# Patient Record
Sex: Female | Born: 1945
Health system: Southern US, Community
[De-identification: ages and names within clinical notes are randomized; demographics above are authoritative.]

## PROBLEM LIST (undated history)

## (undated) DIAGNOSIS — E119 Type 2 diabetes mellitus without complications: Secondary | ICD-10-CM

## (undated) DIAGNOSIS — I2510 Atherosclerotic heart disease of native coronary artery without angina pectoris: Secondary | ICD-10-CM

## (undated) DIAGNOSIS — B019 Varicella without complication: Secondary | ICD-10-CM

## (undated) DIAGNOSIS — I519 Heart disease, unspecified: Secondary | ICD-10-CM

## (undated) DIAGNOSIS — Z87891 Personal history of nicotine dependence: Secondary | ICD-10-CM

## (undated) DIAGNOSIS — I255 Ischemic cardiomyopathy: Secondary | ICD-10-CM

## (undated) DIAGNOSIS — T7840XA Allergy, unspecified, initial encounter: Secondary | ICD-10-CM

## (undated) DIAGNOSIS — H269 Unspecified cataract: Secondary | ICD-10-CM

## (undated) DIAGNOSIS — B059 Measles without complication: Secondary | ICD-10-CM

## (undated) DIAGNOSIS — E785 Hyperlipidemia, unspecified: Secondary | ICD-10-CM

## (undated) DIAGNOSIS — I214 Non-ST elevation (NSTEMI) myocardial infarction: Secondary | ICD-10-CM

## (undated) DIAGNOSIS — I10 Essential (primary) hypertension: Secondary | ICD-10-CM

## (undated) HISTORY — DX: Heart disease, unspecified: I51.9

## (undated) HISTORY — DX: Varicella without complication: B01.9

## (undated) HISTORY — PX: TUBAL LIGATION: SHX77

## (undated) HISTORY — DX: Measles without complication: B05.9

## (undated) HISTORY — DX: Essential (primary) hypertension: I10

## (undated) HISTORY — PX: GALLBLADDER SURGERY: SHX652

## (undated) HISTORY — DX: Unspecified cataract: H26.9

## (undated) HISTORY — DX: Allergy, unspecified, initial encounter: T78.40XA

## (undated) HISTORY — PX: COLONOSCOPY: SHX174

## (undated) HISTORY — PX: CHOLECYSTECTOMY: SHX55

---
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
## 1998-05-20 ENCOUNTER — Other Ambulatory Visit: Admission: RE | Admit: 1998-05-20 | Discharge: 1998-05-20 | Payer: Self-pay | Admitting: Obstetrics and Gynecology

## 1999-06-11 ENCOUNTER — Other Ambulatory Visit: Admission: RE | Admit: 1999-06-11 | Discharge: 1999-06-11 | Payer: Self-pay | Admitting: Obstetrics and Gynecology

## 2000-04-16 ENCOUNTER — Encounter: Payer: Self-pay | Admitting: *Deleted

## 2000-04-16 ENCOUNTER — Encounter: Admission: RE | Admit: 2000-04-16 | Discharge: 2000-04-16 | Payer: Self-pay | Admitting: *Deleted

## 2000-06-14 ENCOUNTER — Other Ambulatory Visit: Admission: RE | Admit: 2000-06-14 | Discharge: 2000-06-14 | Payer: Self-pay | Admitting: *Deleted

## 2001-04-18 ENCOUNTER — Encounter: Admission: RE | Admit: 2001-04-18 | Discharge: 2001-04-18 | Payer: Self-pay | Admitting: *Deleted

## 2001-04-18 ENCOUNTER — Encounter: Payer: Self-pay | Admitting: *Deleted

## 2001-06-21 ENCOUNTER — Other Ambulatory Visit: Admission: RE | Admit: 2001-06-21 | Discharge: 2001-06-21 | Payer: Self-pay | Admitting: Obstetrics and Gynecology

## 2015-06-02 ENCOUNTER — Inpatient Hospital Stay (HOSPITAL_COMMUNITY)
Admission: EM | Admit: 2015-06-02 | Discharge: 2015-06-04 | DRG: 247 | Disposition: A | Discharge status: Home / Self Care | Payer: Medicare Other | Attending: Cardiovascular Disease | Admitting: Cardiovascular Disease

## 2015-06-02 ENCOUNTER — Encounter (HOSPITAL_COMMUNITY)
Admission: EM | Disposition: A | Discharge status: Home / Self Care | Payer: Self-pay | Source: Home / Self Care | Attending: Cardiovascular Disease

## 2015-06-02 ENCOUNTER — Encounter (HOSPITAL_COMMUNITY): Payer: Self-pay | Admitting: *Deleted

## 2015-06-02 ENCOUNTER — Emergency Department (HOSPITAL_COMMUNITY): Payer: Medicare Other

## 2015-06-02 DIAGNOSIS — R9431 Abnormal electrocardiogram [ECG] [EKG]: Secondary | ICD-10-CM

## 2015-06-02 DIAGNOSIS — Z87891 Personal history of nicotine dependence: Secondary | ICD-10-CM | POA: Diagnosis not present

## 2015-06-02 DIAGNOSIS — I214 Non-ST elevation (NSTEMI) myocardial infarction: Secondary | ICD-10-CM | POA: Diagnosis not present

## 2015-06-02 DIAGNOSIS — I10 Essential (primary) hypertension: Secondary | ICD-10-CM | POA: Diagnosis present

## 2015-06-02 DIAGNOSIS — E785 Hyperlipidemia, unspecified: Secondary | ICD-10-CM

## 2015-06-02 DIAGNOSIS — I2510 Atherosclerotic heart disease of native coronary artery without angina pectoris: Secondary | ICD-10-CM | POA: Diagnosis not present

## 2015-06-02 DIAGNOSIS — R0602 Shortness of breath: Secondary | ICD-10-CM | POA: Diagnosis not present

## 2015-06-02 DIAGNOSIS — Z955 Presence of coronary angioplasty implant and graft: Secondary | ICD-10-CM

## 2015-06-02 DIAGNOSIS — I255 Ischemic cardiomyopathy: Secondary | ICD-10-CM | POA: Diagnosis not present

## 2015-06-02 DIAGNOSIS — I252 Old myocardial infarction: Secondary | ICD-10-CM | POA: Diagnosis present

## 2015-06-02 DIAGNOSIS — I2582 Chronic total occlusion of coronary artery: Secondary | ICD-10-CM | POA: Diagnosis not present

## 2015-06-02 DIAGNOSIS — R079 Chest pain, unspecified: Secondary | ICD-10-CM | POA: Diagnosis not present

## 2015-06-02 DIAGNOSIS — I4581 Long QT syndrome: Secondary | ICD-10-CM | POA: Diagnosis present

## 2015-06-02 HISTORY — DX: Non-ST elevation (NSTEMI) myocardial infarction: I21.4

## 2015-06-02 HISTORY — DX: Ischemic cardiomyopathy: I25.5

## 2015-06-02 HISTORY — PX: CARDIAC CATHETERIZATION: SHX172

## 2015-06-02 HISTORY — DX: Atherosclerotic heart disease of native coronary artery without angina pectoris: I25.10

## 2015-06-02 HISTORY — DX: Hyperlipidemia, unspecified: E78.5

## 2015-06-02 HISTORY — DX: Personal history of nicotine dependence: Z87.891

## 2015-06-02 LAB — CBC
HEMATOCRIT: 44.7 % (ref 36.0–46.0)
Hemoglobin: 15.4 g/dL — ABNORMAL HIGH (ref 12.0–15.0)
MCH: 31.2 pg (ref 26.0–34.0)
MCHC: 34.5 g/dL (ref 30.0–36.0)
MCV: 90.7 fL (ref 78.0–100.0)
PLATELETS: 206 10*3/uL (ref 150–400)
RBC: 4.93 MIL/uL (ref 3.87–5.11)
RDW: 12.7 % (ref 11.5–15.5)
WBC: 9.9 10*3/uL (ref 4.0–10.5)

## 2015-06-02 LAB — MRSA PCR SCREENING: MRSA BY PCR: NEGATIVE

## 2015-06-02 LAB — CBC WITH DIFFERENTIAL/PLATELET
BASOS ABS: 0 10*3/uL (ref 0.0–0.1)
Basophils Relative: 0 %
EOS ABS: 0.1 10*3/uL (ref 0.0–0.7)
EOS PCT: 1 %
HCT: 43.1 % (ref 36.0–46.0)
HEMOGLOBIN: 15.2 g/dL — AB (ref 12.0–15.0)
LYMPHS ABS: 1.6 10*3/uL (ref 0.7–4.0)
Lymphocytes Relative: 17 %
MCH: 31.4 pg (ref 26.0–34.0)
MCHC: 35.3 g/dL (ref 30.0–36.0)
MCV: 89 fL (ref 78.0–100.0)
Monocytes Absolute: 0.9 10*3/uL (ref 0.1–1.0)
Monocytes Relative: 10 %
NEUTROS PCT: 72 %
Neutro Abs: 6.8 10*3/uL (ref 1.7–7.7)
PLATELETS: 214 10*3/uL (ref 150–400)
RBC: 4.84 MIL/uL (ref 3.87–5.11)
RDW: 12.5 % (ref 11.5–15.5)
WBC: 9.5 10*3/uL (ref 4.0–10.5)

## 2015-06-02 LAB — COMPREHENSIVE METABOLIC PANEL
ALT: 55 U/L — ABNORMAL HIGH (ref 14–54)
AST: 117 U/L — ABNORMAL HIGH (ref 15–41)
Albumin: 4.1 g/dL (ref 3.5–5.0)
Alkaline Phosphatase: 88 U/L (ref 38–126)
Anion gap: 14 (ref 5–15)
BUN: 8 mg/dL (ref 6–20)
CO2: 18 mmol/L — ABNORMAL LOW (ref 22–32)
Calcium: 9.1 mg/dL (ref 8.9–10.3)
Chloride: 103 mmol/L (ref 101–111)
Creatinine, Ser: 0.79 mg/dL (ref 0.44–1.00)
GFR calc Af Amer: >60 mL/min (ref >60)
GFR calc non Af Amer: >60 mL/min (ref >60)
Glucose, Bld: 146 mg/dL — ABNORMAL HIGH (ref 65–99)
Potassium: 4.2 mmol/L (ref 3.5–5.1)
Sodium: 135 mmol/L (ref 135–145)
Total Bilirubin: 2 mg/dL — ABNORMAL HIGH (ref 0.3–1.2)
Total Protein: 6.8 g/dL (ref 6.5–8.1)

## 2015-06-02 LAB — CREATININE, SERUM: CREATININE: 0.82 mg/dL (ref 0.44–1.00)

## 2015-06-02 LAB — BRAIN NATRIURETIC PEPTIDE: B NATRIURETIC PEPTIDE 5: 371.7 pg/mL — AB (ref 0.0–100.0)

## 2015-06-02 LAB — I-STAT TROPONIN, ED: TROPONIN I, POC: 8.79 ng/mL — AB (ref 0.00–0.08)

## 2015-06-02 LAB — TROPONIN I
Troponin I: 35.89 ng/mL (ref <0.031)
Troponin I: 43.74 ng/mL (ref <0.031)

## 2015-06-02 SURGERY — LEFT HEART CATH AND CORONARY ANGIOGRAPHY
Anesthesia: Choice

## 2015-06-02 MED ORDER — TICAGRELOR 90 MG PO TABS
ORAL_TABLET | ORAL | Status: DC | PRN
  Administered 2015-06-02: 180 mg via ORAL

## 2015-06-02 MED ORDER — ONDANSETRON HCL 4 MG/2ML IJ SOLN
INTRAMUSCULAR | Status: AC
  Filled 2015-06-02: qty 2

## 2015-06-02 MED ORDER — HEPARIN BOLUS VIA INFUSION
4000.0000 [IU] | Freq: Once | INTRAVENOUS | Status: AC
  Administered 2015-06-02: 4000 [IU] via INTRAVENOUS
  Filled 2015-06-02: qty 4000

## 2015-06-02 MED ORDER — FENTANYL CITRATE (PF) 100 MCG/2ML IJ SOLN
INTRAMUSCULAR | Status: AC
  Filled 2015-06-02: qty 4

## 2015-06-02 MED ORDER — PROMETHAZINE HCL 25 MG/ML IJ SOLN: 12.5000 mg | Freq: Four times a day (QID) | INTRAMUSCULAR | Status: DC | PRN

## 2015-06-02 MED ORDER — TIROFIBAN HCL IV 5 MG/100ML
INTRAVENOUS | Status: DC | PRN
  Administered 2015-06-02: 0.15 ug/kg/min via INTRAVENOUS

## 2015-06-02 MED ORDER — TICAGRELOR 90 MG PO TABS
90.0000 mg | ORAL_TABLET | Freq: Two times a day (BID) | ORAL | Status: DC
  Administered 2015-06-02 – 2015-06-04 (×4): 90 mg via ORAL
  Filled 2015-06-02 (×4): qty 1

## 2015-06-02 MED ORDER — BIVALIRUDIN BOLUS VIA INFUSION - CUPID
INTRAVENOUS | Status: DC | PRN
  Administered 2015-06-02: 62.925 mg via INTRAVENOUS

## 2015-06-02 MED ORDER — VERAPAMIL HCL 2.5 MG/ML IV SOLN
INTRAVENOUS | Status: DC | PRN
  Administered 2015-06-02: 10 mL via INTRA_ARTERIAL

## 2015-06-02 MED ORDER — TICAGRELOR 90 MG PO TABS
ORAL_TABLET | ORAL | Status: AC
  Filled 2015-06-02: qty 2

## 2015-06-02 MED ORDER — SODIUM CHLORIDE 0.9 % IV SOLN
250.0000 mg | INTRAVENOUS | Status: DC | PRN
  Administered 2015-06-02: 1.75 mg/kg/h via INTRAVENOUS

## 2015-06-02 MED ORDER — LIDOCAINE HCL (PF) 1 % IJ SOLN
INTRAMUSCULAR | Status: DC | PRN
  Administered 2015-06-02: 5 mL

## 2015-06-02 MED ORDER — SODIUM CHLORIDE 0.9 % IJ SOLN
3.0000 mL | Freq: Two times a day (BID) | INTRAMUSCULAR | Status: DC
Start: 2015-06-02 — End: 2015-06-04
  Administered 2015-06-02 – 2015-06-04 (×4): 3 mL via INTRAVENOUS

## 2015-06-02 MED ORDER — TIROFIBAN HCL IV 5 MG/100ML: 0.1500 ug/kg/min | INTRAVENOUS | Status: DC

## 2015-06-02 MED ORDER — TIROFIBAN (AGGRASTAT) BOLUS VIA INFUSION
INTRAVENOUS | Status: DC | PRN
  Administered 2015-06-02: 2097.5 ug via INTRAVENOUS

## 2015-06-02 MED ORDER — HEPARIN SODIUM (PORCINE) 5000 UNIT/ML IJ SOLN
5000.0000 [IU] | Freq: Three times a day (TID) | INTRAMUSCULAR | Status: DC
  Administered 2015-06-03 (×2): 5000 [IU] via SUBCUTANEOUS
  Filled 2015-06-02 (×2): qty 1

## 2015-06-02 MED ORDER — ASPIRIN EC 81 MG PO TBEC
81.0000 mg | DELAYED_RELEASE_TABLET | Freq: Every day | ORAL | Status: DC
  Administered 2015-06-03 – 2015-06-04 (×2): 81 mg via ORAL
  Filled 2015-06-02 (×2): qty 1

## 2015-06-02 MED ORDER — OXYCODONE-ACETAMINOPHEN 5-325 MG PO TABS: 1.0000 | ORAL_TABLET | ORAL | Status: DC | PRN

## 2015-06-02 MED ORDER — MIDAZOLAM HCL 2 MG/2ML IJ SOLN
INTRAMUSCULAR | Status: AC
  Filled 2015-06-02: qty 4

## 2015-06-02 MED ORDER — FENTANYL CITRATE (PF) 100 MCG/2ML IJ SOLN
INTRAMUSCULAR | Status: DC | PRN
  Administered 2015-06-02 (×3): 25 ug via INTRAVENOUS

## 2015-06-02 MED ORDER — NITROGLYCERIN 1 MG/10 ML FOR IR/CATH LAB
INTRA_ARTERIAL | Status: DC | PRN
  Administered 2015-06-02: 11:00:00

## 2015-06-02 MED ORDER — SODIUM CHLORIDE 0.9 % IJ SOLN: 3.0000 mL | INTRAMUSCULAR | Status: DC | PRN

## 2015-06-02 MED ORDER — SODIUM CHLORIDE 0.9 % IV BOLUS (SEPSIS)
1000.0000 mL | Freq: Once | INTRAVENOUS | Status: AC
  Administered 2015-06-02: 1000 mL via INTRAVENOUS

## 2015-06-02 MED ORDER — NITROGLYCERIN 1 MG/10 ML FOR IR/CATH LAB
INTRA_ARTERIAL | Status: AC
  Filled 2015-06-02: qty 10

## 2015-06-02 MED ORDER — NITROGLYCERIN 0.4 MG SL SUBL
0.4000 mg | SUBLINGUAL_TABLET | SUBLINGUAL | Status: DC | PRN
  Administered 2015-06-02: 0.4 mg via SUBLINGUAL
  Filled 2015-06-02: qty 1

## 2015-06-02 MED ORDER — HEPARIN (PORCINE) IN NACL 2-0.9 UNIT/ML-% IJ SOLN
INTRAMUSCULAR | Status: AC
  Filled 2015-06-02: qty 1500

## 2015-06-02 MED ORDER — SODIUM CHLORIDE 0.9 % IV SOLN
INTRAVENOUS | Status: DC | PRN
  Administered 2015-06-02: 82 mL/h via INTRAVENOUS

## 2015-06-02 MED ORDER — CARVEDILOL 3.125 MG PO TABS
3.1250 mg | ORAL_TABLET | Freq: Two times a day (BID) | ORAL | Status: DC
  Administered 2015-06-02 – 2015-06-04 (×4): 3.125 mg via ORAL
  Filled 2015-06-02 (×4): qty 1

## 2015-06-02 MED ORDER — LIDOCAINE HCL (PF) 1 % IJ SOLN
INTRAMUSCULAR | Status: AC
  Filled 2015-06-02: qty 30

## 2015-06-02 MED ORDER — MIDAZOLAM HCL 2 MG/2ML IJ SOLN
INTRAMUSCULAR | Status: DC | PRN
  Administered 2015-06-02 (×3): 1 mg via INTRAVENOUS

## 2015-06-02 MED ORDER — NITROGLYCERIN IN D5W 200-5 MCG/ML-% IV SOLN
5.0000 ug/min | Freq: Once | INTRAVENOUS | Status: DC
  Filled 2015-06-02: qty 250

## 2015-06-02 MED ORDER — ONDANSETRON HCL 4 MG/2ML IJ SOLN
INTRAMUSCULAR | Status: DC | PRN
  Administered 2015-06-02 (×2): 4 mg via INTRAVENOUS

## 2015-06-02 MED ORDER — NITROGLYCERIN 0.4 MG SL SUBL: 0.4000 mg | SUBLINGUAL_TABLET | SUBLINGUAL | Status: DC | PRN

## 2015-06-02 MED ORDER — TIROFIBAN HCL IV 5 MG/100ML
0.1500 ug/kg/min | INTRAVENOUS | Status: AC
  Administered 2015-06-02: 0.15 ug/kg/min via INTRAVENOUS
  Filled 2015-06-02: qty 100

## 2015-06-02 MED ORDER — VERAPAMIL HCL 2.5 MG/ML IV SOLN
INTRAVENOUS | Status: AC
  Filled 2015-06-02: qty 2

## 2015-06-02 MED ORDER — ASPIRIN 325 MG PO TABS
325.0000 mg | ORAL_TABLET | Freq: Once | ORAL | Status: AC
  Administered 2015-06-02: 325 mg via ORAL
  Filled 2015-06-02: qty 1

## 2015-06-02 MED ORDER — TICAGRELOR 90 MG PO TABS: 180.0000 mg | ORAL_TABLET | Freq: Once | ORAL | Status: DC

## 2015-06-02 MED ORDER — ATORVASTATIN CALCIUM 80 MG PO TABS
80.0000 mg | ORAL_TABLET | Freq: Every day | ORAL | Status: DC
  Administered 2015-06-02 – 2015-06-03 (×2): 80 mg via ORAL
  Filled 2015-06-02 (×2): qty 1

## 2015-06-02 MED ORDER — HEPARIN (PORCINE) IN NACL 100-0.45 UNIT/ML-% IJ SOLN
1100.0000 [IU]/h | INTRAMUSCULAR | Status: DC
  Administered 2015-06-02: 1100 [IU]/h via INTRAVENOUS
  Filled 2015-06-02: qty 250

## 2015-06-02 MED ORDER — TIROFIBAN HCL IV 5 MG/100ML
INTRAVENOUS | Status: AC
  Filled 2015-06-02: qty 100

## 2015-06-02 MED ORDER — PROMETHAZINE HCL 25 MG/ML IJ SOLN
12.5000 mg | Freq: Four times a day (QID) | INTRAMUSCULAR | Status: DC | PRN
  Administered 2015-06-02: 12.5 mg via INTRAVENOUS
  Filled 2015-06-02: qty 1

## 2015-06-02 MED ORDER — SODIUM CHLORIDE 0.9 % IV SOLN
INTRAVENOUS | Status: AC
  Administered 2015-06-02: 13:00:00 via INTRAVENOUS

## 2015-06-02 MED ORDER — HEPARIN (PORCINE) IN NACL 2-0.9 UNIT/ML-% IJ SOLN
INTRAMUSCULAR | Status: DC | PRN
  Administered 2015-06-02: 11:00:00

## 2015-06-02 MED ORDER — DIAZEPAM 5 MG PO TABS: 5.0000 mg | ORAL_TABLET | Freq: Three times a day (TID) | ORAL | Status: DC | PRN

## 2015-06-02 MED ORDER — ONDANSETRON HCL 4 MG/2ML IJ SOLN: 4.0000 mg | Freq: Four times a day (QID) | INTRAMUSCULAR | Status: DC | PRN

## 2015-06-02 MED ORDER — SODIUM CHLORIDE 0.9 % IV SOLN: 250.0000 mL | INTRAVENOUS | Status: DC | PRN

## 2015-06-02 MED ORDER — HEPARIN SODIUM (PORCINE) 1000 UNIT/ML IJ SOLN
INTRAMUSCULAR | Status: AC
  Filled 2015-06-02: qty 1

## 2015-06-02 MED ORDER — IOHEXOL 350 MG/ML SOLN
INTRAVENOUS | Status: DC | PRN
  Administered 2015-06-02: 190 mL via INTRAVENOUS

## 2015-06-02 MED ORDER — BIVALIRUDIN 250 MG IV SOLR
INTRAVENOUS | Status: AC
  Filled 2015-06-02: qty 250

## 2015-06-02 MED ORDER — ACETAMINOPHEN 325 MG PO TABS
650.0000 mg | ORAL_TABLET | ORAL | Status: DC | PRN
  Administered 2015-06-02 – 2015-06-03 (×2): 650 mg via ORAL
  Filled 2015-06-02 (×2): qty 2

## 2015-06-02 SURGICAL SUPPLY — 22 items
BALLN EMERGE MR 2.0X12 (BALLOONS) ×6
BALLN ~~LOC~~ TREK RX 3.25X12 (BALLOONS) ×3
BALLOON EMERGE MR 2.0X12 (BALLOONS) IMPLANT
BALLOON ~~LOC~~ TREK RX 3.25X12 (BALLOONS) ×1 IMPLANT
CATH INFINITI 5 FR JL3.5 (CATHETERS) ×3 IMPLANT
CATH INFINITI 5FR ANG PIGTAIL (CATHETERS) ×3 IMPLANT
CATH INFINITI JR4 5F (CATHETERS) ×3 IMPLANT
CATH VISTA GUIDE 6FR XBLAD3.5 (CATHETERS) ×2 IMPLANT
DEVICE RAD COMP TR BAND LRG (VASCULAR PRODUCTS) ×3 IMPLANT
GLIDESHEATH SLEND SS 6F .021 (SHEATH) ×3 IMPLANT
GUIDEWIRE ANGLED .035X150CM (WIRE) ×2 IMPLANT
KIT ENCORE 26 ADVANTAGE (KITS) ×3 IMPLANT
KIT HEART LEFT (KITS) ×3 IMPLANT
PACK CARDIAC CATHETERIZATION (CUSTOM PROCEDURE TRAY) ×3 IMPLANT
STENT XIENCE ALPINE RX 3.0X15 (Permanent Stent) ×2 IMPLANT
SYR MEDRAD MARK V 150ML (SYRINGE) ×3 IMPLANT
TRANSDUCER W/STOPCOCK (MISCELLANEOUS) ×3 IMPLANT
TUBING CIL FLEX 10 FLL-RA (TUBING) ×3 IMPLANT
WIRE COUGAR XT STRL 190CM (WIRE) ×3 IMPLANT
WIRE HI TORQ VERSACORE-J 145CM (WIRE) ×2 IMPLANT
WIRE HI TORQ WHISPER MS 190CM (WIRE) ×2 IMPLANT
WIRE SAFE-T 1.5MM-J .035X260CM (WIRE) ×3 IMPLANT

## 2015-06-02 NOTE — Progress Notes (Signed)
Utilization Review Completed.Miranda Wong T10/10/2014  

## 2015-06-02 NOTE — ED Provider Notes (Signed)
The patient is a 69 year old female, she denies having any significant medical problems, she does not take any daily medications, she does have a history of smoking 30 years ago. She reports having exertional symptoms when she tries to do yard work with shortness of breath for some time but has never had chest pain. Over the last 2 days since helping somebody move she has developed a more intense feeling in her chest which radiates to her left arm. On exam she has normal heart and lung sounds, no significant edema, no JVD. Her EKG is grossly abnormal with signs of left ventricular hypertrophy, there are T-wave abnormalities and ST segment abnormalities in the anterior precordial leads. There is no old EKG to compare. Chest x-ray labs ordered. We'll consult with cardiology. High concern for unstable angina or non-ST elevation MI, I do not believe that her ST segments in the anterior leads represent STEMI, will discuss with cardiology immediately.  Nitro gtt Heparin gtt  Troponin very elevated - I personally discussed the care with Dr. Burt Knack who agrees that the pt needs to go to the Cath lab ASAP due to onoging pain and infarction  Medical screening examination/treatment/procedure(s) were conducted as a shared visit with non-physician practitioner(s) and myself.  I personally evaluated the patient during the encounter.  Clinical Impression:   Final diagnoses:  NSTEMI (non-ST elevated myocardial infarction) ([place])    CRITICAL CARE Performed by: Johnna Acosta Total critical care time: 35 Critical care time was exclusive of separately billable procedures and treating other patients. Critical care was necessary to treat or prevent imminent or life-threatening deterioration. Critical care was time spent personally by me on the following activities: development of treatment plan with patient and/or surrogate as well as nursing, discussions with consultants, evaluation of patient's response to treatment,  examination of patient, obtaining history from patient or surrogate, ordering and performing treatments and interventions, ordering and review of laboratory studies, ordering and review of radiographic studies, pulse oximetry and re-evaluation of patient's condition.   Noemi Chapel, MD 06/04/15 [phone]

## 2015-06-02 NOTE — ED Notes (Signed)
MD at bedside. 

## 2015-06-02 NOTE — Progress Notes (Signed)
TR band removed and pressure dressing applied. No complications from site. VVS and patient educated on wrist restrictions.

## 2015-06-02 NOTE — ED Notes (Signed)
Pt taken to xray 

## 2015-06-02 NOTE — ED Notes (Signed)
MD and PA at bedside second IV started pt noted to be bradycardic into 40s. Pt place on zoll per MD request.

## 2015-06-02 NOTE — Progress Notes (Addendum)
CRITICAL VALUE ALERT  Critical value received:  Troponin 43.74  Date of notification:  No notification due to prior critical troponin value  MD notified (1st page):  MD Jules Husbands  Time of first page:  2338  Responding MD:  MD Jules Husbands   Time MD responded:  2339  Patient asymptomatic and in NSR.  MD advised to notify of next Troponin value if still critical but if patient asymptomatic, MD will note value but not call RN unless needed.  Vicie Mutters, RN

## 2015-06-02 NOTE — ED Notes (Signed)
Pt BP increased and pt states that she is feeling better. Will continue to monitor.

## 2015-06-02 NOTE — ED Notes (Signed)
Pt reports SOB and back/left arm pain. Pt states that she was moving and lifting boxes over the last week. Pt states that the pain has worsened. Pt reports "severe indigestion". Pt states that she becomes out of breath with exertion.

## 2015-06-02 NOTE — Progress Notes (Signed)
CRITICAL VALUE ALERT  Critical value received:  Troponin 35.89  Date of notification:  06/02/2015  Time of notification:  1761  Critical value read back:Yes.    Nurse who received alert:  Raj Janus, RN  MD notified (1st page):  Jules Husbands, MD  Time of first page:  1735  Responding MD:  Jules Husbands, MD  Time MD responded:  6073  No changes at this time. Pt is stable, continue to cycle enzymes.

## 2015-06-02 NOTE — H&P (Signed)
History and Physical  Patient ID: Miranda Wong MRN: [id number], SOB: 1946/01/19 69 y.o. Date of Encounter: 06/02/2015, 11:02 AM  Primary Physician: No primary care provider on file. Primary Cardiologist: none  Chief Complaint: Chest Pain  HPI: 69 y.o. female who presented to [facility] on 06/02/2015 with complaints of chest pain.  The patient helped a family member move about 48 hours ago. Yesterday she felt soreness in her chest most of the day. This felt like "heartburn." She also had radiation of pain into her left shoulder and down the left arm. There was associated shortness of breath. She had continued chest discomfort this morning and presented by private vehicle to the emergency department. EKG was nondiagnostic but troponin is significantly elevated. The patient was given sublingual nitroglycerin and she became markedly hypotensive, nauseous, and lightheaded. She required IV fluids to resuscitate her blood pressure.  On my evaluation, she continues to complain of mild chest pain. Her other symptoms have currently resolved. She has no past history of cardiac disease. She does not seek regular medical care. She takes no medications. She quit smoking about 30 years ago. Her only past surgery is a cholecystectomy which was performed in the 1980s. She has no history of bleeding problems or blood transfusion. She has no upcoming surgeries planned in the next year.   Past Medical History  Diagnosis Date  . NSTEMI (non-ST elevated myocardial infarction) ([place]) 06/02/2015     Surgical History:  Past Surgical History  Procedure Laterality Date  . Cholecystectomy       Home Meds: Prior to Admission medications   Medication Sig Start Date End Date Taking? Authorizing Provider  hydroxypropyl methylcellulose / hypromellose (ISOPTO TEARS / GONIOVISC) 2.5 % ophthalmic solution Place 1 drop into both eyes as needed for dry eyes.   Yes Historical Provider, MD  olopatadine (PATANOL)  0.1 % ophthalmic solution Place 1 drop into both eyes daily as needed for allergies.   Yes Historical Provider, MD    Allergies: No Known Allergies  Social History   Social History  . Marital Status: Legally Separated    Spouse Name: N/A  . Number of Children: N/A  . Years of Education: N/A   Occupational History  . Not on file.   Social History Main Topics  . Smoking status: Former Research scientist (life sciences)  . Smokeless tobacco: Not on file  . Alcohol Use: No  . Drug Use: Not on file  . Sexual Activity: Not on file   Other Topics Concern  . Not on file   Social History Narrative  . No narrative on file     No family history on file.  Review of Systems: General: negative for chills, fever, night sweats or weight changes.  ENT: negative for rhinorrhea or epistaxis Cardiovascular: See history of present illness Dermatological: negative for rash Respiratory: negative for cough or wheezing. Positive for shortness of breath GI: Positive for nausea, otherwise negative for vomiting, diarrhea, bright red blood per rectum, melena, or hematemesis GU: no hematuria, urgency, or frequency Neurologic: negative for syncope, headache, or dizziness Heme: no easy bruising or bleeding Endo: negative for excessive thirst, thyroid disorder, or flushing Musculoskeletal: Positive for left knee pain, negative for myalgias All other systems reviewed and are otherwise negative except as noted above.  Physical Exam: Blood pressure 115/58, pulse 82, temperature 98.2 F (36.8 C), temperature source Oral, resp. rate 18, height 5\' 3"  (1.6 m), weight 185 lb (83.915 kg), SpO2 100 %. General: Well  developed, well nourished, pleasant overweight woman, alert and oriented, in no acute distress. HEENT: Normocephalic, atraumatic, sclera anicteric Neck: Supple. Carotids 2+ without bruits. JVP normal Lungs: Clear bilaterally to auscultation without wheezes, rales, or rhonchi. Breathing is unlabored. Heart: RRR with normal  S1 and S2. No murmurs, rubs, or gallops appreciated. Abdomen: Soft, non-tender, non-distended with normoactive bowel sounds. No hepatomegaly. No rebound/guarding. No obvious abdominal masses. Back: No CVA tenderness Msk:  Strength and tone appear normal for age. Extremities: No clubbing, cyanosis, or edema.  Distal pedal pulses are 2+ and equal bilaterally. Neuro: CNII-XII intact, moves all extremities spontaneously. Psych:  Responds to questions appropriately with a normal affect. Skin: warm and dry without rash   Labs:   Lab Results  Component Value Date   WBC 9.5 06/02/2015   HGB 15.2* 06/02/2015   HCT 43.1 06/02/2015   MCV 89.0 06/02/2015   PLT 214 06/02/2015    Recent Labs Lab 06/02/15 0910  NA 135  K 4.2  CL 103  CO2 18*  BUN 8  CREATININE 0.79  CALCIUM 9.1  PROT 6.8  BILITOT 2.0*  ALKPHOS 88  ALT 55*  AST 117*  GLUCOSE 146*   No results for input(s): CKTOTAL, CKMB, TROPONINI in the last 72 hours. No results found for: CHOL, HDL, LDLCALC, TRIG No results found for: DDIMER  Radiology/Studies:  Dg Chest 2 View  06/02/2015   CLINICAL DATA:  Shortness of breath and chest pain  EXAM: CHEST  2 VIEW  COMPARISON:  None.  FINDINGS: There is no edema or consolidation. The heart size and pulmonary vascularity are normal. No adenopathy. No pneumothorax. No bone lesions.  IMPRESSION: No edema or consolidation.   Electronically Signed   By: Lowella Grip III M.D.   On: 06/02/2015 09:34     EKG: Normal sinus rhythm, borderline ST elevation in leads V2 and V3 suggestive but not diagnostic of anterior infarction. Q waves in the same leads suggestive of age-indeterminate/recent anteroseptal MI.  ASSESSMENT AND PLAN:  Non-ST elevation MI: The patient has typical symptoms of acute coronary syndrome with elevated troponin and an abnormal EKG. She has ongoing ischemic symptoms. Emergency cardiac catheterization and possible PCI are indicated. I have reviewed the risks,  indications, and alternatives to cardiac catheterization and possible angioplasty/stenting with the patient. Risks include but are not limited to bleeding, infection, vascular injury, stroke, myocardial infection, arrhythmia, kidney injury, radiation-related injury in the case of prolonged fluoroscopy use, emergency cardiac surgery, and death. The patient understands the risks of serious complication is low (<7%).   Further plans pending cardiac cath results. The patient will require efforts at secondary risk reduction. We'll check a lipid panel tomorrow morning. She will be placed on a high intensity statin drug. Other appropriate medical therapies will be instituted.  Deatra James MD 06/02/2015, 11:02 AM

## 2015-06-02 NOTE — ED Notes (Signed)
Dr. Cooper at bedside 

## 2015-06-02 NOTE — ED Notes (Signed)
Pt received 1 nitro SL. Pt became dizzy and diaphoretic. Pt states that her arm pain decreased. Pt became hypotensive. MD made aware and 1 liter NS bolus started.

## 2015-06-02 NOTE — ED Provider Notes (Signed)
CSN: [id number]     Arrival date & time 06/02/15  2706 History   First MD Initiated Contact with Patient 06/02/15 [phone]     Chief Complaint  Patient presents with  . Shortness of Breath  . Arm Pain   Miranda Wong is a 69 y.o. female who is otherwise generally healthy who presents to the ED complaining of substernal and left sided chest pain that radiates to her back and left arm ongoing for 3 days. The patient currently complains of 6/10 chest and arm pain that she describes as a throb and ache. She reports she has slight shortness of breath currently and DOE. She reports having DOE for several months, but denies recent chest pain until 3 days ago. She denies early family history of MI. She does report her mother and father have both had MIs. She has history of hypertension, hyperlipidemia or diabetes. She is a former smoker. The patient denies having a cardiologist. She reports she is followed by her PCP Dr. Benay Pillow but has not seen him in many years. She reports a stress test 20 years ago. The patient denies fevers, chills, neck pain, numbness, tingling, weakness, palpitations, leg pain, leg swelling, urinary symptoms, abdominal pain, nausea or vomiting.  (Consider location/radiation/quality/duration/timing/severity/associated sxs/prior Treatment) HPI  History reviewed. No pertinent past medical history. Past Surgical History  Procedure Laterality Date  . Cholecystectomy     No family history on file. Social History  Substance Use Topics  . Smoking status: Former Research scientist (life sciences)  . Smokeless tobacco: None  . Alcohol Use: No   OB History    No data available     Review of Systems  Constitutional: Negative for fever and chills.  HENT: Negative for congestion and sore throat.   Eyes: Negative for visual disturbance.  Respiratory: Positive for shortness of breath. Negative for cough and wheezing.   Cardiovascular: Positive for chest pain. Negative for palpitations and leg swelling.   Gastrointestinal: Negative for nausea, vomiting and abdominal pain.  Genitourinary: Negative for dysuria.  Musculoskeletal: Negative for back pain and neck pain.  Skin: Negative for rash.  Neurological: Negative for syncope, weakness, light-headedness, numbness and headaches.      Allergies  Review of patient's allergies indicates no known allergies.  Home Medications   Prior to Admission medications   Not on File   BP 144/79 mmHg  Pulse 95  Temp(Src) 98.2 F (36.8 C) (Oral)  Resp 20  Ht 5\' 3"  (1.6 m)  Wt 185 lb (83.915 kg)  BMI 32.78 kg/m2  SpO2 97% Physical Exam  Constitutional: She is oriented to person, place, and time. She appears well-developed and well-nourished. No distress.  Nontoxic appearing.  HENT:  Head: Normocephalic and atraumatic.  Mouth/Throat: Oropharynx is clear and moist.  Eyes: Conjunctivae are normal. Pupils are equal, round, and reactive to light. Right eye exhibits no discharge. Left eye exhibits no discharge.  Neck: Normal range of motion. Neck supple. No JVD present. No tracheal deviation present.  Cardiovascular: Normal rate, regular rhythm, normal heart sounds and intact distal pulses.  Exam reveals no gallop and no friction rub.   No murmur heard. Bilateral radial, posterior tibialis and dorsalis pedis pulses are intact.  Good capillary refill.   Pulmonary/Chest: Effort normal and breath sounds normal. No respiratory distress. She has no wheezes. She has no rales. She exhibits tenderness.  Lungs are clear to auscultation bilaterally. The patient does have mild left-sided chest wall tenderness that does not completely reproduce her chest pain.  Abdominal: Soft. She exhibits no distension. There is no tenderness. There is no guarding.  Abdomen is soft and nontender to palpation.  Musculoskeletal: She exhibits no edema or tenderness.  No lower extremity edema or tenderness. Patient is spontaneously moving all extremities in a coordinated fashion  exhibiting good strength.   Lymphadenopathy:    She has no cervical adenopathy.  Neurological: She is alert and oriented to person, place, and time. Coordination normal.  Skin: Skin is warm and dry. No rash noted. She is not diaphoretic. No erythema. No pallor.  Psychiatric: Her behavior is normal. Her mood appears anxious.  Patient appears slightly anxious.  Nursing note and vitals reviewed.   ED Course  Procedures (including critical care time) Labs Review Labs Reviewed  COMPREHENSIVE METABOLIC PANEL - Abnormal; Notable for the following:    CO2 18 (*)    Glucose, Bld 146 (*)    AST 117 (*)    ALT 55 (*)    Total Bilirubin 2.0 (*)    All other components within normal limits  BRAIN NATRIURETIC PEPTIDE - Abnormal; Notable for the following:    B Natriuretic Peptide 371.7 (*)    All other components within normal limits  CBC WITH DIFFERENTIAL/PLATELET - Abnormal; Notable for the following:    Hemoglobin 15.2 (*)    All other components within normal limits  I-STAT TROPOININ, ED - Abnormal; Notable for the following:    Troponin i, poc 8.79 (*)    All other components within normal limits  MRSA PCR SCREENING  CBC WITH DIFFERENTIAL/PLATELET  CBC  CREATININE, SERUM  TROPONIN I  TROPONIN I  TROPONIN I    Imaging Review Dg Chest 2 View  06/02/2015   CLINICAL DATA:  Shortness of breath and chest pain  EXAM: CHEST  2 VIEW  COMPARISON:  None.  FINDINGS: There is no edema or consolidation. The heart size and pulmonary vascularity are normal. No adenopathy. No pneumothorax. No bone lesions.  IMPRESSION: No edema or consolidation.   Electronically Signed   By: Lowella Grip III M.D.   On: 06/02/2015 09:34   I have personally reviewed and evaluated these images and lab results as part of my medical decision-making.   EKG Interpretation   Date/Time:  Sunday June 02 2015 08:31:04 EDT Ventricular Rate:  103 PR Interval:  170 QRS Duration: 102 QT Interval:  368 QTC  Calculation: 482 R Axis:   -57 Text Interpretation:  Sinus tachycardia LVH with secondary repolarization  abnormality Probable anterior infarct, age indeterminate Baseline wander  in lead(s) V2 V4 V5 st abnormality V2 Abnormal ekg No old tracing to  compare Confirmed by MILLER  MD, BRIAN ([id number]) on 06/02/2015 8:41:03 AM      Filed Vitals:   06/02/15 0834  BP: 144/79  Pulse: 95  Temp: 98.2 F (36.8 C)  TempSrc: Oral  Resp: 20  Height: 5\' 3"  (1.6 m)  Weight: 185 lb (83.915 kg)  SpO2: 97%     MDM   Meds given in ED:  Medications  nitroGLYCERIN (NITROSTAT) SL tablet 0.4 mg (not administered)  nitroGLYCERIN 50 mg in dextrose 5 % 250 mL (0.2 mg/mL) infusion (not administered)  aspirin tablet 325 mg (325 mg Oral Given 06/02/15 0915)    New Prescriptions   No medications on file    Final diagnoses:  NSTEMI (non-ST elevated myocardial infarction) [facility])    This is a 69 y.o. female who is otherwise generally healthy who presents to the ED complaining of substernal and  left sided chest pain that radiates to her back and left arm ongoing for 3 days. The patient currently complains of 6/10 chest and arm pain that she describes as a throb and ache. She reports she has slight shortness of breath currently and DOE. She reports having DOE for several months, but denies recent chest pain until 3 days ago.  On exam patient is afebrile and nontoxic-appearing. She appears uncomfortable. Her lungs are clear to auscultation bilaterally. Her oxygen saturation is 98% on room air. No lower extremity edema or tenderness. EKG shows LVH and possible anterior infarct. Blood work and CXR ordered and pending. Will initiate ACS protocol and start on heparin and NTG. 324 ASA given.   0945. Elevated tropinin at 8.79. Chest pain improved with NTG and her back pain resolved. Pressures dropped in to the 74B systollic and thus stopped further NTG and NTG drip. Awaiting cardiology call back.  6384. Blood pressure  and HR improved. Chest pain still improved after NTG. Will hold NTG drip at this time.  Dr. Sabra Heck consulted with cardiologist Dr. Burt Knack who will be down to see the patient and plans to take her to the cath lab.  Patient to cath lab by Dr. Burt Knack.   This patient was discussed with and evaluated by Dr. Sabra Heck who agrees with assessment and plan.   CRITICAL CARE Performed by: Hanley Hays   Total critical care time: 35  Critical care time was exclusive of separately billable procedures and treating other patients.  Critical care was necessary to treat or prevent imminent or life-threatening deterioration.  Critical care was time spent personally by me on the following activities: development of treatment plan with patient and/or surrogate as well as nursing, discussions with consultants, evaluation of patient's response to treatment, examination of patient, obtaining history from patient or surrogate, ordering and performing treatments and interventions, ordering and review of laboratory studies, ordering and review of radiographic studies, pulse oximetry and re-evaluation of patient's condition.      Waynetta Pean, PA-C 06/02/15 1549  Noemi Chapel, MD 06/04/15 [phone]

## 2015-06-02 NOTE — Progress Notes (Signed)
ANTICOAGULATION CONSULT NOTE - Initial Consult  Pharmacy Consult for Heparin Indication: chest pain/ACS  No Known Allergies  Patient Measurements: Height: 5\' 3"  (160 cm) Weight: 185 lb (83.915 kg) IBW/kg (Calculated) : 52.4 Heparin Dosing Weight: 65kg  Vital Signs: Temp: 98.2 F (36.8 C) (10/02 0834) Temp Source: Oral (10/02 0834) BP: 144/79 mmHg (10/02 0834) Pulse Rate: 95 (10/02 0834)  Labs: No results for input(s): HGB, HCT, PLT, APTT, LABPROT, INR, HEPARINUNFRC, CREATININE, CKTOTAL, CKMB, TROPONINI in the last 72 hours.  CrCl cannot be calculated (Patient has no serum creatinine result on file.).  Medical History: History reviewed. No pertinent past medical history.  Assessment: 69yo female who presents with c/o chest pain.  She is currently undergoing cardiac work-up and we have been asked to start her on IV heparin.  She is obese and we will use an adjusted weight for initial therapy and adjust from there.  Labs are in process but she has no pertinent history of any GI bleeds.  She is not on any home medications including any anticoagulants of any kind.  Goal of Therapy:  Heparin level 0.3-0.7 units/ml Monitor platelets by anticoagulation protocol: Yes   Plan:  - Heparin 4000 units IV bolus x 1  - Heparin 1100 units/hr - F/U 6 hour level and adjust - CBC/HL daily  Rober Minion, PharmD., MS Clinical Pharmacist Pager:  [phone] Thank you for allowing pharmacy to be part of this patients care team. 06/02/2015,9:28 AM

## 2015-06-03 ENCOUNTER — Encounter (HOSPITAL_COMMUNITY): Payer: Self-pay | Admitting: Cardiovascular Disease

## 2015-06-03 ENCOUNTER — Inpatient Hospital Stay (HOSPITAL_COMMUNITY): Payer: Medicare Other

## 2015-06-03 DIAGNOSIS — I214 Non-ST elevation (NSTEMI) myocardial infarction: Secondary | ICD-10-CM

## 2015-06-03 LAB — BASIC METABOLIC PANEL
ANION GAP: 8 (ref 5–15)
BUN: 9 mg/dL (ref 6–20)
CALCIUM: 8.6 mg/dL — AB (ref 8.9–10.3)
CO2: 25 mmol/L (ref 22–32)
Chloride: 105 mmol/L (ref 101–111)
Creatinine, Ser: 0.86 mg/dL (ref 0.44–1.00)
Glucose, Bld: 124 mg/dL — ABNORMAL HIGH (ref 65–99)
POTASSIUM: 3.8 mmol/L (ref 3.5–5.1)
SODIUM: 138 mmol/L (ref 135–145)

## 2015-06-03 LAB — CBC
HEMATOCRIT: 41 % (ref 36.0–46.0)
HEMOGLOBIN: 13.9 g/dL (ref 12.0–15.0)
MCH: 30.9 pg (ref 26.0–34.0)
MCHC: 33.9 g/dL (ref 30.0–36.0)
MCV: 91.1 fL (ref 78.0–100.0)
Platelets: 210 10*3/uL (ref 150–400)
RBC: 4.5 MIL/uL (ref 3.87–5.11)
RDW: 13 % (ref 11.5–15.5)
WBC: 8.8 10*3/uL (ref 4.0–10.5)

## 2015-06-03 LAB — LIPID PANEL
CHOLESTEROL: 192 mg/dL (ref 0–200)
HDL: 44 mg/dL (ref >40)
LDL CALC: 122 mg/dL — AB (ref 0–99)
TRIGLYCERIDES: 131 mg/dL (ref <150)
Total CHOL/HDL Ratio: 4.4 RATIO
VLDL: 26 mg/dL (ref 0–40)

## 2015-06-03 LAB — TROPONIN I: TROPONIN I: 45.32 ng/mL — AB (ref <0.031)

## 2015-06-03 LAB — POCT ACTIVATED CLOTTING TIME: Activated Clotting Time: 374 seconds

## 2015-06-03 MED ORDER — LISINOPRIL 10 MG PO TABS
10.0000 mg | ORAL_TABLET | Freq: Every day | ORAL | Status: DC
  Administered 2015-06-03 – 2015-06-04 (×2): 10 mg via ORAL
  Filled 2015-06-03 (×2): qty 1

## 2015-06-03 NOTE — Progress Notes (Signed)
CRITICAL VALUE ALERT  Critical value received: Troponin 45.32  Date of notification: No notification due to prior critical troponin value  MD notified (paged): MD Jules Husbands  Time of page: [phone]  Patient asymptomatic and in NSR.  Per previous conversation with MD, page to notify of troponin value but no return call necessary unless patient condition symptomatic.  Vicie Mutters, RN

## 2015-06-03 NOTE — Progress Notes (Signed)
Report called to 3W RN.  

## 2015-06-03 NOTE — Progress Notes (Addendum)
Patient Name: Miranda Wong Date of Encounter: 06/03/2015  Active Problems:   NSTEMI (non-ST elevated myocardial infarction) [facility])   NSTEMI, initial episode of care [facility])   Length of Stay: 1  SUBJECTIVE  Feels well - no angina, dyspnea or arrhythmia   CATH 06/02/15, 1100h  Mid RCA lesion, 25% stenosed.  Prox Cx lesion, 25% stenosed.  Prox LAD lesion, 100% stenosed. There is a 0% residual stenosis post intervention.  A drug-eluting stent was placed.  There is moderate left ventricular systolic dysfunction.  1. Total occlusion of the mid-LAD at the first diagonal origin, treated successfully with PCI (DES platform) 2. Mild nonobstructive LCx and RCA stenosis 3. Moderate segmental LV systolic dysfunction with LVEF estimated at 40%  Recommendations: Aggrastat x 6 hours. Pt given brilinta 180 mg in the cath lab but vomited shortly after administration. Will re-dose Brilinta in 2 hours.    CURRENT MEDS . aspirin EC  81 mg Oral Daily  . atorvastatin  80 mg Oral q1800  . carvedilol  3.125 mg Oral BID WC  . heparin  5,000 Units Subcutaneous 3 times per day  . sodium chloride  3 mL Intravenous Q12H  . ticagrelor  90 mg Oral BID    OBJECTIVE   Intake/Output Summary (Last 24 hours) at 06/03/15 0822 Last data filed at 06/03/15 0400  Gross per 24 hour  Intake 1045.3 ml  Output   1450 ml  Net -404.7 ml   Filed Weights   06/02/15 0834 06/02/15 1300 06/03/15 0400  Weight: 185 lb (83.915 kg) 219 lb 12.8 oz (99.701 kg) 214 lb 4.6 oz (97.2 kg)    PHYSICAL EXAM Filed Vitals:   06/03/15 0400 06/03/15 0509 06/03/15 0600 06/03/15 0744  BP: 140/74 149/70 131/59 145/66  Pulse: 75 79 62 72  Temp: 98.6 F (37 C)     TempSrc: Oral     Resp: 26 20 16    Height: 5\' 3"  (1.6 m)     Weight: 214 lb 4.6 oz (97.2 kg)     SpO2: 92% 94% 89%    General: Alert, oriented x3, no distress Head: no evidence of trauma, PERRL, EOMI, no exophtalmos or lid lag, no myxedema, no  xanthelasma; normal ears, nose and oropharynx Neck: normal jugular venous pulsations and no hepatojugular reflux; brisk carotid pulses without delay and no carotid bruits Chest: clear to auscultation, no signs of consolidation by percussion or palpation, normal fremitus, symmetrical and full respiratory excursions Cardiovascular: normal position and quality of the apical impulse, regular rhythm, normal first and second heart sounds, no rub, loud S4 gallop, no murmur Abdomen: no tenderness or distention, no masses by palpation, no abnormal pulsatility or arterial bruits, normal bowel sounds, no hepatosplenomegaly Extremities: no clubbing, cyanosis or edema; 2+ radial, ulnar and brachial pulses bilaterally; 2+ right femoral, posterior tibial and dorsalis pedis pulses; 2+ left femoral, posterior tibial and dorsalis pedis pulses; no subclavian or femoral bruits. Healthy radial access site Neurological: grossly nonfocal  LABS  CBC  Recent Labs  06/02/15 1038 06/02/15 1610 06/03/15 0050  WBC 9.5 9.9 8.8  NEUTROABS 6.8  --   --   HGB 15.2* 15.4* 13.9  HCT 43.1 44.7 41.0  MCV 89.0 90.7 91.1  PLT 214 206 096   Basic Metabolic Panel  Recent Labs  06/02/15 0910 06/02/15 1610 06/03/15 0050  NA 135  --  138  K 4.2  --  3.8  CL 103  --  105  CO2 18*  --  25  GLUCOSE 146*  --  124*  BUN 8  --  9  CREATININE 0.79 0.82 0.86  CALCIUM 9.1  --  8.6*   Liver Function Tests  Recent Labs  06/02/15 0910  AST 117*  ALT 55*  ALKPHOS 88  BILITOT 2.0*  PROT 6.8  ALBUMIN 4.1   No results for input(s): LIPASE, AMYLASE in the last 72 hours. Cardiac Enzymes  Recent Labs  06/02/15 1610 06/02/15 2202 06/03/15 0050  TROPONINI 35.89* 43.74* 45.32*   BNP Invalid input(s): POCBNP D-Dimer No results for input(s): DDIMER in the last 72 hours. Hemoglobin A1C No results for input(s): HGBA1C in the last 72 hours. Fasting Lipid Panel  Recent Labs  06/03/15 0050  CHOL 192  HDL 44   LDLCALC 122*  TRIG 131  CHOLHDL 4.4   Thyroid Function Tests No results for input(s): TSH, T4TOTAL, T3FREE, THYROIDAB in the last 72 hours.  Invalid input(s): [place]  Radiology Studies Imaging results have been reviewed and Dg Chest 2 View  06/02/2015   CLINICAL DATA:  Shortness of breath and chest pain  EXAM: CHEST  2 VIEW  COMPARISON:  None.  FINDINGS: There is no edema or consolidation. The heart size and pulmonary vascularity are normal. No adenopathy. No pneumothorax. No bone lesions.  IMPRESSION: No edema or consolidation.   Electronically Signed   By: Lowella Grip III M.D.   On: 06/02/2015 09:34    TELE NSR  ECG NSR, very deep, broad anterior T wave inversion and marked QT prolongation consistent with postischemic stunning, hopefully indicator of future recovery  ASSESSMENT AND PLAN  S/P extensive anterior NSTEMI due to LAD occlusion with early revascularization, but with evidence of substantial injury by wall motion and troponin release. At risk for CHF and arrhythmia, none yet clinically apparent.  Transfer telemetry. Avoid additional beta blocker until QT shortens. Add ACEi. Ambulate/cardiac rehab. Echo. Potential DC tomorrow. Discussed mandatory DAPT for 1 year, role of statin, need for monitoring for symptoms of HF, plan to reevaluate EF in a few weeks. Diet and exercise.   Sanda Klein, MD, [facility] [phone] office [phone] pager 06/03/2015 8:22 AM

## 2015-06-03 NOTE — Progress Notes (Signed)
Attempted to call report to 3W, RN will call back.

## 2015-06-03 NOTE — Care Management Note (Signed)
Case Management Note  Patient Details  Name: Miranda Wong MRN: [id number] Date of Birth: 13-Sep-1945  Subjective/Objective:    Adm w mi                Action/Plan: lives w fam  Expected Discharge Date:                  Expected Discharge Plan:  Home/Self Care  In-House Referral:     Discharge planning Services  CM Consult, Medication Assistance  Post Acute Care Choice:    Choice offered to:     DME Arranged:    DME Agency:     HH Arranged:    [place] Agency:     Status of Service:     Medicare Important Message Given:    Date Medicare IM Given:    Medicare IM give by:    Date Additional Medicare IM Given:    Additional Medicare Important Message give by:     If discussed at [place] of Stay Meetings, dates discussed:    Additional Comments: have left pt 30day free brilinta card.  Lacretia Leigh, RN 06/03/2015, 10:10 AM

## 2015-06-03 NOTE — Progress Notes (Signed)
*  PRELIMINARY RESULTS* Echocardiogram 2D echo has been performed.  Leavy Cella 06/03/2015, 2:10 PM

## 2015-06-03 NOTE — Progress Notes (Signed)
CARDIAC REHAB PHASE I   PRE:  Rate/Rhythm: 70 SR    BP: sitting 127/78    SaO2: 93 RA  MODE:  Ambulation: 490 ft   POST:  Rate/Rhythm: 86 SR    BP: sitting 141/70     SaO2: 94 RA  Tolerated very well, no c/o. Left materials for ed but pt requests to wait to discuss due to her friend being here. Pt seems to be processing her MI today. Also gave videos for pt to watch. Will f/u later, can walk independently. [phone]   Josephina Shih [place] CES, ACSM 06/03/2015 11:17 AM

## 2015-06-04 ENCOUNTER — Encounter (HOSPITAL_COMMUNITY): Payer: Self-pay | Admitting: Physician Assistant

## 2015-06-04 ENCOUNTER — Telehealth: Payer: Self-pay | Admitting: Cardiovascular Disease

## 2015-06-04 DIAGNOSIS — I4581 Long QT syndrome: Secondary | ICD-10-CM

## 2015-06-04 DIAGNOSIS — Z87891 Personal history of nicotine dependence: Secondary | ICD-10-CM

## 2015-06-04 DIAGNOSIS — R9431 Abnormal electrocardiogram [ECG] [EKG]: Secondary | ICD-10-CM

## 2015-06-04 DIAGNOSIS — I255 Ischemic cardiomyopathy: Secondary | ICD-10-CM

## 2015-06-04 DIAGNOSIS — E785 Hyperlipidemia, unspecified: Secondary | ICD-10-CM

## 2015-06-04 HISTORY — DX: Ischemic cardiomyopathy: I25.5

## 2015-06-04 MED ORDER — CARVEDILOL 3.125 MG PO TABS: 3.1250 mg | ORAL_TABLET | Freq: Two times a day (BID) | ORAL | Status: DC

## 2015-06-04 MED ORDER — TICAGRELOR 90 MG PO TABS: 90.0000 mg | ORAL_TABLET | Freq: Two times a day (BID) | ORAL | Status: DC

## 2015-06-04 MED ORDER — ATORVASTATIN CALCIUM 80 MG PO TABS: 80.0000 mg | ORAL_TABLET | Freq: Every day | ORAL | Status: DC

## 2015-06-04 MED ORDER — LISINOPRIL 10 MG PO TABS: 10.0000 mg | ORAL_TABLET | Freq: Every day | ORAL | Status: DC

## 2015-06-04 MED ORDER — ASPIRIN 81 MG PO TBEC: 81.0000 mg | DELAYED_RELEASE_TABLET | Freq: Every day | ORAL | Status: AC

## 2015-06-04 MED ORDER — NITROGLYCERIN 0.4 MG SL SUBL: 0.4000 mg | SUBLINGUAL_TABLET | SUBLINGUAL | Status: AC | PRN

## 2015-06-04 NOTE — Telephone Encounter (Signed)
TCM per [facility]  10/11 @ 9:30 w/ Nicki Reaper

## 2015-06-04 NOTE — Discharge Summary (Signed)
Discharge Summary   Patient ID: Miranda Wong,  MRN: [id number], DOB/AGE: 67-19-1947 67 y.o.  Admit date: 06/02/2015 Discharge date: 06/04/2015  Primary Care Provider: No primary care provider on file. Primary Cardiologist: Dr. Burt Knack  Discharge Diagnoses Principal Problem:   NSTEMI, initial episode of care [facility]) Active Problems:   Hyperlipidemia   Mild ichemic cardiomyopathy, EF 45-50% post cath 06/2015   Prolonged Q-T interval on ECG   Former smoker   Allergies No Known Allergies  Procedures  Echocardiogram 06/03/2015 LV EF: 45% -  50%  ------------------------------------------------------------------- Indications:   MI - nontransmural - acute 410.71.  ------------------------------------------------------------------- History:  PMH: Former Smoker. PMH:  Myocardial infarction.  ------------------------------------------------------------------- Study Conclusions  - Left ventricle: The cavity size was normal. Wall thickness was increased in a pattern of mild LVH. Systolic function was mildly reduced. The estimated ejection fraction was in the range of 45% to 50%. Severe hypokinesis of the basal-midanteroseptal myocardium.     Cardiac catheterization 06/02/2015 Conclusion     Mid RCA lesion, 25% stenosed.  Prox Cx lesion, 25% stenosed.  Prox LAD lesion, 100% stenosed. There is a 0% residual stenosis post intervention.  A drug-eluting stent was placed.  There is moderate left ventricular systolic dysfunction.  1. Total occlusion of the mid-LAD at the first diagonal origin, treated successfully with PCI (DES platform) 2. Mild nonobstructive LCx and RCA stenosis 3. Moderate segmental LV systolic dysfunction with LVEF estimated at 40%  Recommendations: Aggrastat x 6 hours. Pt given brilinta 180 mg in the cath lab but vomited shortly after administration. Will re-dose Brilinta in 2 hours.       Hospital Course  The patient is a  pleasant 69 year old female with no significant past medical history. She does not seek regular medical care. She takes no medication. She is a former smoker who quit 30 years ago. She presented to [facility] on 06/02/2015 with complaint of chest pain, initial EKG showed borderline ST elevation in lead V2 and V3 suggestive but not diagnostic of anterior infarction. Her first troponin was markedly elevated at 8.79 which later trended up to 45. Given significant elevation of troponin, she was taken urgently to the cath lab which showed 25% mid RCA, 25% prox LCx, 100% prox LAD treated with DES (3.0 x 15 mm Xience), EF 40%. Post cath, she was placed on aspirin, carvedilol, lisinopril, Brilinta and high-dose Lipitor. She did have some prolonged QTC, this was monitored on telemetry. She did not have significant ventricular ectopy. On cath, EF was 40%, echocardiogram was obtained on the following day on 06/03/2015 which showed EF 45-50%, severe hypokinesis of basal mid anteroseptal myocardium. Lipid panel showed LDL 122.  Patient was seen in the morning of 06/04/2015, at which time she denies any significant chest discomfort or shortness of breath. Her right radial cath site appears to be stable without significant bleeding or hematoma. Repeat emphasis has been placed on compliance with dual antiplatelets therapy. She is deemed stable for discharge from cardiology perspective. She has been seen and referred to cardiac rehabilitation. She ambulated with our cardiac rehabilitation nurse on 10/3 without significant discomfort. I will arrange seven-day transition of care follow-up with Dr. Burt Knack or his PA in the clinic. A 30 day free coupon has been given to the patient along with a separate prescription for Brilinta to go with coupon. I have also filled out her brilinta assistance form to help with cost.   Discharge Vitals Blood pressure 120/70, pulse 70, temperature 98.5 F (36.9  C), temperature source Oral,  resp. rate 16, height 5\' 3"  (1.6 m), weight 214 lb 11.7 oz (97.4 kg), SpO2 94 %.  Filed Weights   06/02/15 1300 06/03/15 0400 06/04/15 0500  Weight: 219 lb 12.8 oz (99.701 kg) 214 lb 4.6 oz (97.2 kg) 214 lb 11.7 oz (97.4 kg)    Labs  CBC  Recent Labs  06/02/15 1038 06/02/15 1610 06/03/15 0050  WBC 9.5 9.9 8.8  NEUTROABS 6.8  --   --   HGB 15.2* 15.4* 13.9  HCT 43.1 44.7 41.0  MCV 89.0 90.7 91.1  PLT 214 206 027   Basic Metabolic Panel  Recent Labs  06/02/15 0910 06/02/15 1610 06/03/15 0050  NA 135  --  138  K 4.2  --  3.8  CL 103  --  105  CO2 18*  --  25  GLUCOSE 146*  --  124*  BUN 8  --  9  CREATININE 0.79 0.82 0.86  CALCIUM 9.1  --  8.6*   Liver Function Tests  Recent Labs  06/02/15 0910  AST 117*  ALT 55*  ALKPHOS 88  BILITOT 2.0*  PROT 6.8  ALBUMIN 4.1   Cardiac Enzymes  Recent Labs  06/02/15 1610 06/02/15 2202 06/03/15 0050  TROPONINI 35.89* 43.74* 45.32*   Fasting Lipid Panel  Recent Labs  06/03/15 0050  CHOL 192  HDL 44  LDLCALC 122*  TRIG 131  CHOLHDL 4.4    Disposition  Pt is being discharged home today in good condition.  Follow-up Plans & Appointments      Follow-up Information    Follow up with Richardson Dopp, PA-C On 06/11/2015.   Specialties:  Physician Assistant, Radiology, Interventional Cardiology   Why:  9:30am.   Contact information:   [address] [address] [phone]       Discharge Medications    Medication List    TAKE these medications        aspirin 81 MG EC tablet  Take 1 tablet (81 mg total) by mouth daily.     atorvastatin 80 MG tablet  Commonly known as:  LIPITOR  Take 1 tablet (80 mg total) by mouth daily at 6 PM.     carvedilol 3.125 MG tablet  Commonly known as:  COREG  Take 1 tablet (3.125 mg total) by mouth 2 (two) times daily with a meal.     hydroxypropyl methylcellulose / hypromellose 2.5 % ophthalmic solution  Commonly known as:  ISOPTO  TEARS / GONIOVISC  Place 1 drop into both eyes as needed for dry eyes.     lisinopril 10 MG tablet  Commonly known as:  PRINIVIL,ZESTRIL  Take 1 tablet (10 mg total) by mouth daily.     nitroGLYCERIN 0.4 MG SL tablet  Commonly known as:  NITROSTAT  Place 1 tablet (0.4 mg total) under the tongue every 5 (five) minutes x 3 doses as needed for chest pain.     olopatadine 0.1 % ophthalmic solution  Commonly known as:  PATANOL  Place 1 drop into both eyes daily as needed for allergies.     ticagrelor 90 MG Tabs tablet  Commonly known as:  BRILINTA  Take 1 tablet (90 mg total) by mouth 2 (two) times daily.        Duration of Discharge Encounter   Greater than 30 minutes including physician time.  Hilbert Corrigan PA-C Pager: [phone] 06/04/2015, 10:02 AM

## 2015-06-04 NOTE — Progress Notes (Signed)
CARDIAC REHAB PHASE I   PRE:  Rate/Rhythm: 69    BP: sitting 140/57    SaO2: 95 RA  MODE:  Ambulation: 490 ft   POST:  Rate/Rhythm: 74    BP: sitting 135/64     SaO2: 95 RA  Tolerated well. No c/o. Ed completed voicing understanding. Interested in [place] and will send referral to [place]. Pt has been under considerable stress this past year, losing her husband and son.  [phone]   Josephina Shih [place] CES, ACSM 06/04/2015 11:27 AM

## 2015-06-04 NOTE — Research (Signed)
[employer] research Study Presented to patient. Questions encouraged and answered. Patient declined to participate.

## 2015-06-04 NOTE — Care Management Note (Signed)
Case Management Note  Patient Details  Name: WINOLA DRUM MRN: [id number] Date of Birth: 12/27/1945  Subjective/Objective:  Pt admitted for cp- NSTEMI. Plan for d/c home today on Brilinta. Pt is without Rx insurance. Pt receives medicare.                 Action/Plan: CM did provide pt with 30 day free card and is aware to call the support services line. Patient Assistance form given to pt to complete and for cardiology office to fax to [facility]. CM did call [facility] on [address] and medication is available. No further needs from CM at this time.    Expected Discharge Date:                  Expected Discharge Plan:  Home/Self Care  In-House Referral:  NA  Discharge planning Services  CM Consult, Medication Assistance  Post Acute Care Choice:  NA Choice offered to:  NA  DME Arranged:  N/A DME Agency:  NA  HH Arranged:  NA HH Agency:  NA  Status of Service:  Completed, signed off  Medicare Important Message Given:    Date Medicare IM Given:    Medicare IM give by:    Date Additional Medicare IM Given:    Additional Medicare Important Message give by:     If discussed at [place] of Stay Meetings, dates discussed:    Additional Comments:  Bethena Roys, RN 06/04/2015, 10:09 AM

## 2015-06-04 NOTE — Progress Notes (Signed)
Patient Name: Miranda Wong Date of Encounter: 06/04/2015  Primary Cardiologist: Dr. Burt Knack   Principal Problem:   NSTEMI, initial episode of care [facility]) Active Problems:   Hyperlipidemia   Mild ichemic cardiomyopathy, EF 45-50% post cath 06/2015   Prolonged Q-T interval on ECG   Former smoker    SUBJECTIVE  Denies any CP or SOB.   CURRENT MEDS . aspirin EC  81 mg Oral Daily  . atorvastatin  80 mg Oral q1800  . carvedilol  3.125 mg Oral BID WC  . heparin  5,000 Units Subcutaneous 3 times per day  . lisinopril  10 mg Oral Daily  . sodium chloride  3 mL Intravenous Q12H  . ticagrelor  90 mg Oral BID    OBJECTIVE  Filed Vitals:   06/03/15 1021 06/03/15 1442 06/03/15 2013 06/04/15 0500  BP: 127/69 155/72 110/55 112/55  Pulse: 68 73 77 73  Temp: 98.2 F (36.8 C) 98.4 F (36.9 C) 99.1 F (37.3 C) 98.5 F (36.9 C)  TempSrc: Oral Oral Oral Oral  Resp:   16 16  Height:      Weight:    214 lb 11.7 oz (97.4 kg)  SpO2: 98% 93% 93% 94%    Intake/Output Summary (Last 24 hours) at 06/04/15 9937 Last data filed at 06/03/15 0800  Gross per 24 hour  Intake    240 ml  Output      0 ml  Net    240 ml   Filed Weights   06/02/15 1300 06/03/15 0400 06/04/15 0500  Weight: 219 lb 12.8 oz (99.701 kg) 214 lb 4.6 oz (97.2 kg) 214 lb 11.7 oz (97.4 kg)    PHYSICAL EXAM  General: Pleasant, NAD. Neuro: Alert and oriented X 3. Moves all extremities spontaneously. Psych: Normal affect. HEENT:  Normal  Neck: Supple without bruits or JVD. Lungs:  Resp regular and unlabored, CTA. Heart: RRR no s3, s4, or murmurs. R radial cath site stable, 2+ distal radial pulse.  Abdomen: Soft, non-tender, non-distended, BS + x 4.  Extremities: No clubbing, cyanosis or edema. DP/PT/Radials 2+ and equal bilaterally.  Accessory Clinical Findings  CBC  Recent Labs  06/02/15 1038 06/02/15 1610 06/03/15 0050  WBC 9.5 9.9 8.8  NEUTROABS 6.8  --   --   HGB 15.2* 15.4* 13.9  HCT 43.1 44.7  41.0  MCV 89.0 90.7 91.1  PLT 214 206 169   Basic Metabolic Panel  Recent Labs  06/02/15 0910 06/02/15 1610 06/03/15 0050  NA 135  --  138  K 4.2  --  3.8  CL 103  --  105  CO2 18*  --  25  GLUCOSE 146*  --  124*  BUN 8  --  9  CREATININE 0.79 0.82 0.86  CALCIUM 9.1  --  8.6*   Liver Function Tests  Recent Labs  06/02/15 0910  AST 117*  ALT 55*  ALKPHOS 88  BILITOT 2.0*  PROT 6.8  ALBUMIN 4.1   Cardiac Enzymes  Recent Labs  06/02/15 1610 06/02/15 2202 06/03/15 0050  TROPONINI 35.89* 43.74* 45.32*   Fasting Lipid Panel  Recent Labs  06/03/15 0050  CHOL 192  HDL 44  LDLCALC 122*  TRIG 131  CHOLHDL 4.4    TELE NSR with HR 70s    ECG  No new EKG  Echocardiogram 06/03/2015  LV EF: 45% -  50%  ------------------------------------------------------------------- Indications:   MI - nontransmural - acute 410.71.  ------------------------------------------------------------------- History:  PMH: Former  Smoker. PMH:  Myocardial infarction.  ------------------------------------------------------------------- Study Conclusions  - Left ventricle: The cavity size was normal. Wall thickness was increased in a pattern of mild LVH. Systolic function was mildly reduced. The estimated ejection fraction was in the range of 45% to 50%. Severe hypokinesis of the basal-midanteroseptal myocardium.     Radiology/Studies  Dg Chest 2 View  06/02/2015   CLINICAL DATA:  Shortness of breath and chest pain  EXAM: CHEST  2 VIEW  COMPARISON:  None.  FINDINGS: There is no edema or consolidation. The heart size and pulmonary vascularity are normal. No adenopathy. No pneumothorax. No bone lesions.  IMPRESSION: No edema or consolidation.   Electronically Signed   By: Lowella Grip III M.D.   On: 06/02/2015 09:34    ASSESSMENT AND PLAN  69 yo female with no PMH and does not take medication presented to [facility] on 06/02/2015 with complaint of CP and  noted to have significant trop rise despite nondiagnostic EKG. Underwent PCI to LAD  1. NSTEMI   - cath 06/02/2015 25% mid RCA, 25% prox LCx, 100% prox LAD treated with DES (3.0 x 15 mm Xience), EF 40%  - continue ASA, coreg, lisinopril, brilinta and high dose lipitor. Uptitrate BB as BP allow  - ambulate by cardiac rehab today, likely can be discharged afterward  2. Mild ischemic cardiomyopathy  - EF 40% by cath 06/02/2015  - Echo 06/03/2015 EF 45-50%, severe hypokinesis of basal-midanteroseptal myocardium.  3. Hyperlipidemia: LDL 122, started on statin  4. Prolonged QTc  Signed, Almyra Deforest PA-C Pager: [phone]  I have seen and examined the patient along with Almyra Deforest PA-C.  I have reviewed the chart, notes and new data.  I agree with PA/NP's note.  Key new complaints: no angina or dyspnea Key examination changes: S4 gallop, no signs/sxs of HF Key new findings / data: echo already shows some recovery of LV function  PLAN: DC home DAPT importance reinforced Statin, ACEi/beta blocker Cardiac rehab  Sanda Klein, MD, [place] [phone] 06/04/2015, 8:38 AM

## 2015-06-04 NOTE — Discharge Instructions (Signed)
Acute Coronary Syndrome  Acute coronary syndrome (ACS) is an urgent problem in which the blood and oxygen supply to the heart is critically deficient. ACS requires hospitalization because one or more coronary arteries may be blocked.  ACS represents a range of conditions including:  · Previous angina that is now unstable, lasts longer, happens at rest, or is more intense.  · A heart attack, with heart muscle cell injury and death.  There are three vital coronary arteries that supply the heart muscle with blood and oxygen so that it can pump blood effectively. If blockages to these arteries develop, blood flow to the heart muscle is reduced. If the heart does not get enough blood, angina may occur as the first warning sign.  SYMPTOMS   · The most common signs of angina include:  ¨ Tightness or squeezing in the chest.  ¨ Feeling of heaviness on the chest.  ¨ Discomfort in the arms, neck, back, or jaw.  ¨ Shortness of breath and nausea.  ¨ Cold, wet skin.  · Angina is usually brought on by physical effort or excitement which increase the oxygen needs of the heart. These states increase the blood flow needs of the heart beyond what can be delivered.  · Other symptoms that are not as common include:  ¨ Fatigue  ¨ Unexplained feelings of nervousness or anxiety  ¨ Weakness  ¨ Diarrhea  · Sometimes, you may not have noticed any symptoms at all but still suffered a cardiac injury.  TREATMENT   · Medicines to help discomfort may include nitroglycerin (nitro) in the form of tablets or a spray for rapid relief, or longer-acting forms such as cream, patches, or capsules. (Be aware that there are many side effects and possible interactions with other drugs).  · Other medicines may be used to help the heart pump better.  · Procedures to open blocked arteries including angioplasty or stent placement to keep the arteries open.  · Open heart surgery may be needed when there are many blockages or they are in critical locations that  are best treated with surgery.  HOME CARE INSTRUCTIONS   · Do not use any tobacco products including cigarettes, chewing tobacco, or electronic cigarettes.  · Take one baby or adult aspirin daily, if your health care provider advises. This helps reduce the risk of a heart attack.  · It is very important that you follow the angina treatment prescribed by your health care provider. Make arrangements for proper follow-up care.  · Eat a heart healthy diet with salt and fat restrictions as advised.  · Regular exercise is good for you as long as it does not cause discomfort. Do not begin any new type of exercise until you check with your health care provider.  · If you are overweight, you should lose weight.  · Try to maintain normal blood lipid levels.  · Keep your blood pressure under control as recommended by your health care provider.  · You should tell your health care provider right away about any increase in the severity or frequency of your chest discomfort or angina attacks. When you have angina, you should stop what you are doing and sit down. This may bring relief in 3 to 5 minutes. If your health care provider has prescribed nitro, take it as directed.  · If your health care provider has given you a follow-up appointment, it is very important to keep that appointment. Not keeping the appointment could result in a chronic or   of breath. °· You feel faint, lightheaded, or pass out. °· Your chest discomfort gets worse. °· You are sweating or experience sudden profound fatigue. °· You do not get relief of your chest pain after 3 doses of nitro. °· Your discomfort lasts longer than 15 minutes. °MAKE SURE YOU:  °· Understand these instructions. °· Will watch your condition. °· Will get help right  away if you are not doing well or get worse. °· Take all medicines as directed by your health care provider. °Document Released: 08/17/2005 Document Revised: 08/22/2013 Document Reviewed: 12/19/2013 °ExitCare® Patient Information ©2015 [employer]. This information is not intended to replace advice given to you by your health care provider. Make sure you discuss any questions you have with your health care provider. ° °No driving for 24hours. No lifting over 5 lbs for 1 week. No sexual activity for 1 week. Keep procedure site clean & dry. If you notice increased pain, swelling, bleeding or pus, call/return!  You may shower, but no soaking baths/hot tubs/pools for 1 week.  ° ° °

## 2015-06-05 NOTE — Telephone Encounter (Signed)
Left message to call back  

## 2015-06-06 NOTE — Telephone Encounter (Signed)
Left message to call back  

## 2015-06-07 NOTE — Telephone Encounter (Signed)
Left message to call back  

## 2015-06-10 NOTE — Progress Notes (Signed)
Cardiology Office Note   Date:  06/11/2015   ID:  Miranda Wong, DOB 13-Feb-1946, MRN [id number]  PCP:  No PCP Per Patient  Cardiologist:  Dr. Sherren Mocha   Electrophysiologist:  n/a  Chief Complaint  Patient presents with  . Hospitalization Follow-up    NSTEMI >> PCI  . Coronary Artery Disease     History of Present Illness: Miranda Wong is a 69 y.o. female former smoker who was admitted 10/2-10/4 with an anterior non-STEMI. Initial ECG did demonstrate slightly elevated ST segments in V2-V3 but not diagnostic of infarction. Troponins were elevated and she was taken urgently to the cardiac catheterization lab which demonstrated an occluded proximal LAD. This was treated with a Xience DES. Ejection fraction was reduced. Follow-up echo demonstrated EF 45-50%. Post PCI was fairly uneventful. She returns for follow-up.   She is doing well since DC from the hospital. She has had some shortness of breath at times but no significant limitations.  She denies chest discomfort, orthopnea, PND, edema. She denies syncope.  Lipitor was expensive for her.  But, her Rx drug coverage just kicked in and may cover it next month.     Studies/Reports Reviewed Today:  Echo 06/03/15 Mild LVH, EF 45-50%, anteroseptal HK  LHC 06/02/15 LAD: Proximal 100%  >> PCI: 3 x 15 mm Xience DES LCx: Proximal 25% RCA: Mid 25% EF 35-45%    1. Total occlusion of the mid-LAD at the first diagonal origin, treated successfully with PCI (DES platform) 2. Mild nonobstructive LCx and RCA stenosis 3. Moderate segmental LV systolic dysfunction with LVEF estimated at 40% Recommendations: Aggrastat x 6 hours. Pt given brilinta 180 mg in the cath lab but vomited shortly after administration. Will re-dose Brilinta in 2 hours.   Past Medical History  Diagnosis Date  . NSTEMI (non-ST elevated myocardial infarction) ([place]) 06/02/2015  . Hyperlipidemia   . Mild ichemic cardiomyopathy, EF 45-50% post cath 06/2015  06/04/2015  . CAD (coronary artery disease)     cath 06/02/2015 25% mid RCA, 25% prox LCx, 100% prox LAD treated with DES (3.0 x 15 mm Xience), EF 40%  . Former smoker     Past Surgical History  Procedure Laterality Date  . Cholecystectomy    . Cardiac catheterization N/A 06/02/2015    Procedure: Left Heart Cath and Coronary Angiography;  Surgeon: Sherren Mocha, MD;  Location: [facility];  Service: Cardiovascular;  Laterality: N/A;     Current Outpatient Prescriptions  Medication Sig Dispense Refill  . aspirin EC 81 MG EC tablet Take 1 tablet (81 mg total) by mouth daily.    Marland Kitchen atorvastatin (LIPITOR) 80 MG tablet Take 1 tablet (80 mg total) by mouth daily at 6 PM. 90 tablet 3  . carvedilol (COREG) 3.125 MG tablet Take 1 tablet (3.125 mg total) by mouth 2 (two) times daily with a meal. 60 tablet 11  . hydroxypropyl methylcellulose / hypromellose (ISOPTO TEARS / GONIOVISC) 2.5 % ophthalmic solution Place 1 drop into both eyes as needed for dry eyes.    Marland Kitchen lisinopril (PRINIVIL,ZESTRIL) 10 MG tablet Take 1 tablet (10 mg total) by mouth 2 (two) times daily. 180 tablet 3  . nitroGLYCERIN (NITROSTAT) 0.4 MG SL tablet Place 1 tablet (0.4 mg total) under the tongue every 5 (five) minutes x 3 doses as needed for chest pain. 25 tablet 3  . olopatadine (PATANOL) 0.1 % ophthalmic solution Place 1 drop into both eyes daily as needed for allergies.    Marland Kitchen  ticagrelor (BRILINTA) 90 MG TABS tablet Take 1 tablet (90 mg total) by mouth 2 (two) times daily. 60 tablet 11   No current facility-administered medications for this visit.    Allergies:   Review of patient's allergies indicates no known allergies.    Social History:  The patient  reports that she has quit smoking. She does not have any smokeless tobacco history on file. She reports that she does not drink alcohol or use illicit drugs.   Family History:  The patient's family history includes Heart attack in her father; Valvular heart disease in  her mother.    ROS:   Please see the history of present illness.   Review of Systems  Respiratory: Positive for shortness of breath.   All other systems reviewed and are negative.     PHYSICAL EXAM: VS:  BP 152/72 mmHg  Pulse 55  Ht 5\' 3"  (1.6 m)  Wt 213 lb 1.9 oz (96.671 kg)  BMI 37.76 kg/m2  SpO2 99%    Wt Readings from Last 3 Encounters:  06/11/15 213 lb 1.9 oz (96.671 kg)  06/04/15 214 lb 11.7 oz (97.4 kg)     GEN: Well nourished, well developed, in no acute distress HEENT: normal Neck: no JVD,   no masses Cardiac:  Normal S1/S2, RRR; no murmur ,  no rubs or gallops, no edema; right wrist without hematoma or mass  Respiratory:  clear to auscultation bilaterally, no wheezing, rhonchi or rales. GI: soft, nontender, nondistended, + BS MS: no deformity or atrophy Skin: warm and dry  Neuro:  CNs II-XII intact, Strength and sensation are intact Psych: Normal affect   EKG:  EKG is ordered today.  It demonstrates:   Sinus brady, HR 55, LAD, Q waves V1-2, TWI 1, aVL, aVF, V3-6   Recent Labs: 06/02/2015: ALT 55*; B Natriuretic Peptide 371.7* 06/03/2015: BUN 9; Creatinine, Ser 0.86; Hemoglobin 13.9; Platelets 210; Potassium 3.8; Sodium 138    Lipid Panel    Component Value Date/Time   CHOL 192 06/03/2015 0050   TRIG 131 06/03/2015 0050   HDL 44 06/03/2015 0050   CHOLHDL 4.4 06/03/2015 0050   VLDL 26 06/03/2015 0050   LDLCALC 122* 06/03/2015 0050      ASSESSMENT AND PLAN:  1. CAD:  S/p NSTEMI treated with DES to LAD.  Doing well since DC from the hospital.  She denies any recurrent angina.  She understands the importance of dual antiplatelet Rx.  Refer to Cardiac Rehab.  Continue ASA, Brilinta, beta-blocker, ACE inhibitor, statin.    2. Ischemic Cardiomyopathy:  EF 45-50% by echo prior to DC.  Continue beta-blocker, ACE inhibitor. No signs of volume excess.  She is NYHA 2.   3. Hyperlipidemia:  Continue statin.  Check Lipids and LFTs 6 weeks. She is not sure if she  can afford generic Lipitor. She will let us know.  If cost is prohibitive, we can change to Pravastatin 80.  4. HTN: BP uncontrolled.  Will increase Lisinopril to 10 mg bid.  I have asked her to monitor her BP at home and bring in a list of BP readings to her next visit.      Medication Changes: Current medicines are reviewed at length with the patient today.  Concerns regarding medicines are as outlined above.  The following changes have been made:   Discontinued Medications   No medications on file   Modified Medications   Modified Medication Previous Medication   LISINOPRIL (PRINIVIL,ZESTRIL) 10 MG TABLET lisinopril (  PRINIVIL,ZESTRIL) 10 MG tablet      Take 1 tablet (10 mg total) by mouth 2 (two) times daily.    Take 1 tablet (10 mg total) by mouth daily.   New Prescriptions   No medications on file   Labs/ tests ordered today include:   Orders Placed This Encounter  Procedures  . Basic metabolic panel  . Lipid Profile  . Hepatic function panel  . EKG 12-Lead     Disposition:    FU with Dr. Sherren Mocha 6-8 weeks.     Signed, Versie Starks, MHS 06/11/2015 5:00 PM    [facility], [place], [place]  [address] Phone: [phone]; Fax: [phone]

## 2015-06-11 ENCOUNTER — Ambulatory Visit (INDEPENDENT_AMBULATORY_CARE_PROVIDER_SITE_OTHER): Payer: Medicare Other | Admitting: Physician Assistant

## 2015-06-11 ENCOUNTER — Encounter: Payer: Self-pay | Admitting: Physician Assistant

## 2015-06-11 VITALS — BP 152/72 | HR 55 | Ht 63.0 in | Wt 213.1 lb

## 2015-06-11 DIAGNOSIS — I2510 Atherosclerotic heart disease of native coronary artery without angina pectoris: Secondary | ICD-10-CM | POA: Diagnosis not present

## 2015-06-11 DIAGNOSIS — I255 Ischemic cardiomyopathy: Secondary | ICD-10-CM | POA: Diagnosis not present

## 2015-06-11 DIAGNOSIS — E785 Hyperlipidemia, unspecified: Secondary | ICD-10-CM

## 2015-06-11 DIAGNOSIS — I10 Essential (primary) hypertension: Secondary | ICD-10-CM

## 2015-06-11 MED ORDER — LISINOPRIL 10 MG PO TABS: 10.0000 mg | ORAL_TABLET | Freq: Two times a day (BID) | ORAL | Status: DC

## 2015-06-11 NOTE — Patient Instructions (Signed)
Medication Instructions:  1. INCREASE LISINOPRIL TO 10 MG TWICE DAILY  Labwork: 1. BMET TO BE DONE IN 1 WEEK  2. FASTING LIPID AND LIVER PANEL TO BE DONE IN 6 WEEKS  Testing/Procedures: NONE  Follow-Up: DR. Burt Knack IN 6-8 WEEKS  Any Other Special Instructions Will Be Listed Below (If Applicable).  1. CARDIAC REHAB AT [facility]  2. HERE IS THE PHONE # FOR [email or web address] [phone] FOR [place]

## 2015-06-18 ENCOUNTER — Other Ambulatory Visit (INDEPENDENT_AMBULATORY_CARE_PROVIDER_SITE_OTHER): Payer: Medicare Other

## 2015-06-18 DIAGNOSIS — E785 Hyperlipidemia, unspecified: Secondary | ICD-10-CM | POA: Diagnosis not present

## 2015-06-18 LAB — BASIC METABOLIC PANEL
BUN: 15 mg/dL (ref 7–25)
CHLORIDE: 105 mmol/L (ref 98–110)
CO2: 24 mmol/L (ref 20–31)
Calcium: 9.1 mg/dL (ref 8.6–10.4)
Creat: 0.9 mg/dL (ref 0.50–0.99)
GLUCOSE: 97 mg/dL (ref 65–99)
POTASSIUM: 4.5 mmol/L (ref 3.5–5.3)
Sodium: 140 mmol/L (ref 135–146)

## 2015-06-19 ENCOUNTER — Telehealth: Payer: Self-pay | Admitting: *Deleted

## 2015-06-19 NOTE — Telephone Encounter (Signed)
Lmptcb for lab results 

## 2015-06-20 NOTE — Telephone Encounter (Signed)
Follow up     Patient calling back - test results

## 2015-06-20 NOTE — Telephone Encounter (Signed)
Pt notified of lab results by phone with verbal understanding.  

## 2015-07-04 ENCOUNTER — Encounter (HOSPITAL_COMMUNITY)
Admission: RE | Admit: 2015-07-04 | Discharge: 2015-07-04 | Disposition: A | Discharge status: Home / Self Care | Payer: Medicare Other | Source: Ambulatory Visit | Attending: Cardiovascular Disease | Admitting: Cardiovascular Disease

## 2015-07-04 DIAGNOSIS — Z955 Presence of coronary angioplasty implant and graft: Secondary | ICD-10-CM | POA: Insufficient documentation

## 2015-07-04 DIAGNOSIS — Z951 Presence of aortocoronary bypass graft: Secondary | ICD-10-CM | POA: Insufficient documentation

## 2015-07-04 DIAGNOSIS — I213 ST elevation (STEMI) myocardial infarction of unspecified site: Secondary | ICD-10-CM | POA: Insufficient documentation

## 2015-07-04 NOTE — Progress Notes (Signed)
Cardiac Rehab Medication Review by a Pharmacist  Does the patient  feel that his/her medications are working for him/her?  yes  Has the patient been experiencing any side effects to the medications prescribed?  Yes; Patient endorses some mild constipation with her new medications. This is relieved with PRN milk of magnesia. Discussed other options such as Miralax and stool softeners.  Does the patient measure his/her own blood pressure or blood glucose at home?  no   Does the patient have any problems obtaining medications due to transportation or finances?   Yes; Patient endorses that Miranda Wong is very expensive (~$300 per month) out of pocket because her Medicare Part D benefits are not active until January. I discussed contacting the company directly, which she has done, but her income last year was too much to qualify for assistance. She states she was given a savings card by her physician, but it only covers the first month. I recommended she speak with her physician and ask if he had samples or could switch her to another medication.   Understanding of regimen: excellent Understanding of indications: good Potential of compliance: excellent    Pharmacist comments: Miranda Wong has a great understanding of her new medication regimen. We discussed some mild constipation she has been experiencing from her new medications and I recommended OTC medications to help with this. I also recommended she speak with her doctor about the high cost of Brilinta to see if there were other options or if he has medication samples.    Governor Specking, PharmD Clinical Pharmacy Resident Pager: [phone] 07/04/2015 8:26 AM

## 2015-07-08 ENCOUNTER — Encounter (HOSPITAL_COMMUNITY)
Admission: RE | Admit: 2015-07-08 | Discharge: 2015-07-08 | Disposition: A | Discharge status: Home / Self Care | Payer: Medicare Other | Source: Ambulatory Visit | Attending: Cardiovascular Disease | Admitting: Cardiovascular Disease

## 2015-07-08 DIAGNOSIS — I213 ST elevation (STEMI) myocardial infarction of unspecified site: Secondary | ICD-10-CM | POA: Diagnosis not present

## 2015-07-08 DIAGNOSIS — Z951 Presence of aortocoronary bypass graft: Secondary | ICD-10-CM | POA: Diagnosis not present

## 2015-07-08 DIAGNOSIS — Z955 Presence of coronary angioplasty implant and graft: Secondary | ICD-10-CM | POA: Diagnosis not present

## 2015-07-08 NOTE — Progress Notes (Signed)
Pt started cardiac rehab today.  Pt tolerated light exercise without difficulty. VSS, telemetry-Sinus , asymptomatic.  Medication list reconciled.  Pt verbalized compliance with medications and denies barriers to compliance. PSYCHOSOCIAL ASSESSMENT:  PHQ-0. Pt exhibits positive coping skills, hopeful outlook with supportive family. No psychosocial needs identified at this time, no psychosocial interventions necessary.    Pt enjoys reading and watching TV .   Pt cardiac rehab  goal is  to loose weight.  Pt encouraged to participate in exercising on your own to increase ability to achieve these goals.   Pt long term cardiac rehab goal is to have a healthy heart.  Pt oriented to exercise equipment and routine.  Understanding verbalized. Will continue to monitor the patient throughout  the program.

## 2015-07-10 ENCOUNTER — Encounter (HOSPITAL_COMMUNITY)
Admission: RE | Admit: 2015-07-10 | Discharge: 2015-07-10 | Disposition: A | Discharge status: Home / Self Care | Payer: Medicare Other | Source: Ambulatory Visit | Attending: Cardiovascular Disease | Admitting: Cardiovascular Disease

## 2015-07-10 DIAGNOSIS — I213 ST elevation (STEMI) myocardial infarction of unspecified site: Secondary | ICD-10-CM | POA: Diagnosis not present

## 2015-07-10 DIAGNOSIS — Z955 Presence of coronary angioplasty implant and graft: Secondary | ICD-10-CM | POA: Diagnosis not present

## 2015-07-10 DIAGNOSIS — Z951 Presence of aortocoronary bypass graft: Secondary | ICD-10-CM | POA: Diagnosis not present

## 2015-07-12 ENCOUNTER — Encounter (HOSPITAL_COMMUNITY)
Admission: RE | Admit: 2015-07-12 | Discharge: 2015-07-12 | Disposition: A | Discharge status: Home / Self Care | Payer: Medicare Other | Source: Ambulatory Visit | Attending: Cardiovascular Disease | Admitting: Cardiovascular Disease

## 2015-07-12 DIAGNOSIS — Z955 Presence of coronary angioplasty implant and graft: Secondary | ICD-10-CM | POA: Diagnosis not present

## 2015-07-12 DIAGNOSIS — I213 ST elevation (STEMI) myocardial infarction of unspecified site: Secondary | ICD-10-CM | POA: Diagnosis not present

## 2015-07-12 DIAGNOSIS — Z951 Presence of aortocoronary bypass graft: Secondary | ICD-10-CM | POA: Diagnosis not present

## 2015-07-12 NOTE — Progress Notes (Signed)
QUALITY OF LIFE SCORE REVIEW Patient score low in area of health and functioning with a score of 19.61.  Scores less than 21 are considered low.  Patient quality of life slightly altered by physical constraints which limits ability to perform as prior to recent cardiac illness.  Miranda Wong reports that she is has been slightly dissatisfied with her health since having her heart attack and stent placement.  Offered emotional support and reassurance.  Will continue to monitor and intervene as necessary.   PSYCHOSOCIAL HEALTH ASSESSMENT  Miranda Wong's husband past away from lung cancer last year. Miranda Wong's son died last year from a heart attack.  Miranda Wong's says she has good family support and denies being depressed. Miranda Wong also helps to take care of her elderly mother with her siblings.

## 2015-07-15 ENCOUNTER — Encounter (HOSPITAL_COMMUNITY)
Admission: RE | Admit: 2015-07-15 | Discharge: 2015-07-15 | Disposition: A | Discharge status: Home / Self Care | Payer: Medicare Other | Source: Ambulatory Visit | Attending: Cardiovascular Disease | Admitting: Cardiovascular Disease

## 2015-07-15 DIAGNOSIS — Z955 Presence of coronary angioplasty implant and graft: Secondary | ICD-10-CM | POA: Diagnosis not present

## 2015-07-15 DIAGNOSIS — Z951 Presence of aortocoronary bypass graft: Secondary | ICD-10-CM | POA: Diagnosis not present

## 2015-07-15 DIAGNOSIS — I213 ST elevation (STEMI) myocardial infarction of unspecified site: Secondary | ICD-10-CM | POA: Diagnosis not present

## 2015-07-16 ENCOUNTER — Encounter: Payer: Self-pay | Admitting: Cardiovascular Disease

## 2015-07-16 ENCOUNTER — Ambulatory Visit (INDEPENDENT_AMBULATORY_CARE_PROVIDER_SITE_OTHER): Payer: Medicare Other | Admitting: Cardiovascular Disease

## 2015-07-16 VITALS — BP 150/80 | HR 59 | Ht 63.0 in | Wt 211.8 lb

## 2015-07-16 DIAGNOSIS — R0602 Shortness of breath: Secondary | ICD-10-CM | POA: Diagnosis not present

## 2015-07-16 DIAGNOSIS — I255 Ischemic cardiomyopathy: Secondary | ICD-10-CM

## 2015-07-16 DIAGNOSIS — I252 Old myocardial infarction: Secondary | ICD-10-CM

## 2015-07-16 LAB — BASIC METABOLIC PANEL
BUN: 12 mg/dL (ref 7–25)
CHLORIDE: 108 mmol/L (ref 98–110)
CO2: 24 mmol/L (ref 20–31)
Calcium: 9.2 mg/dL (ref 8.6–10.4)
Creat: 0.77 mg/dL (ref 0.50–0.99)
GLUCOSE: 114 mg/dL — AB (ref 65–99)
Potassium: 4.2 mmol/L (ref 3.5–5.3)
SODIUM: 139 mmol/L (ref 135–146)

## 2015-07-16 LAB — LIPID PANEL
CHOL/HDL RATIO: 2.4 ratio (ref <=5.0)
CHOLESTEROL: 128 mg/dL (ref 125–200)
HDL: 54 mg/dL (ref >=46)
LDL Cholesterol: 54 mg/dL (ref <130)
Triglycerides: 101 mg/dL (ref <150)
VLDL: 20 mg/dL (ref <30)

## 2015-07-16 LAB — HEPATIC FUNCTION PANEL
ALK PHOS: 87 U/L (ref 33–130)
ALT: 41 U/L — AB (ref 6–29)
AST: 52 U/L — AB (ref 10–35)
Albumin: 4.1 g/dL (ref 3.6–5.1)
BILIRUBIN DIRECT: 0.2 mg/dL (ref <=0.2)
BILIRUBIN TOTAL: 1 mg/dL (ref 0.2–1.2)
Indirect Bilirubin: 0.8 mg/dL (ref 0.2–1.2)
Total Protein: 7 g/dL (ref 6.1–8.1)

## 2015-07-16 MED ORDER — CARVEDILOL 6.25 MG PO TABS: 6.2500 mg | ORAL_TABLET | Freq: Two times a day (BID) | ORAL | Status: DC

## 2015-07-16 NOTE — Progress Notes (Signed)
Cardiology Office Note Date:  07/16/2015   ID:  Miranda Wong, DOB [birth year], MRN [id number]  PCP:  No PCP Per Patient  Cardiologist:  Sherren Mocha, MD    Chief Complaint  Patient presents with  . Follow-up    no refills   History of Present Illness: Miranda Wong is a 69 y.o. female who presents for follow-up of coronary artery disease. The patient presented with an anterior wall MI 06/02/2015. The patient's initial EKG did not meet diagnostic criteria for STEMI, but with ongoing pain and troponin elevation, she was brought urgently for cardiac catheterization. This demonstrated total occlusion of the proximal LAD and she was treated with a drug-eluting stent. LVEF by follow-up echo was 45-50%. She was noted to have mild nonobstructive disease in the left circumflex and right coronary arteries at the time of her heart catheterization. Her post MI hospital course was uneventful. She was last seen 06/11/2015 at which time she was doing well.  The patient denies recurrence of chest pain or pressure. She does have shortness of breath with exertion and occasionally at nighttime. She sleeps on 2 pillows. She does not have symptoms of orthopnea or PND. She denies leg swelling, heart palpitations, lightheadedness, or syncope. She is enrolled in outpatient cardiac rehabilitation and is currently in her second week.  Past Medical History  Diagnosis Date  . NSTEMI (non-ST elevated myocardial infarction) ([place]) 06/02/2015  . Hyperlipidemia   . Mild ichemic cardiomyopathy, EF 45-50% post cath 06/2015 06/04/2015  . CAD (coronary artery disease)     cath 06/02/2015 25% mid RCA, 25% prox LCx, 100% prox LAD treated with DES (3.0 x 15 mm Xience), EF 40%  . Former smoker     Past Surgical History  Procedure Laterality Date  . Cholecystectomy    . Cardiac catheterization N/A 06/02/2015    Procedure: Left Heart Cath and Coronary Angiography;  Surgeon: Sherren Mocha, MD;  Location: [facility];  Service: Cardiovascular;  Laterality: N/A;    Current Outpatient Prescriptions  Medication Sig Dispense Refill  . acetaminophen (TYLENOL) 325 MG tablet Take 650 mg by mouth every 6 (six) hours as needed for headache.    Marland Kitchen aspirin EC 81 MG EC tablet Take 1 tablet (81 mg total) by mouth daily.    Marland Kitchen atorvastatin (LIPITOR) 80 MG tablet Take 1 tablet (80 mg total) by mouth daily at 6 PM. 90 tablet 3  . b complex vitamins tablet Take 1 tablet by mouth every other day.    . carvedilol (COREG) 6.25 MG tablet Take 1 tablet (6.25 mg total) by mouth 2 (two) times daily with a meal. 60 tablet 6  . fluticasone (FLONASE) 50 MCG/ACT nasal spray Place 2 sprays into both nostrils as needed for allergies or rhinitis.    . hydroxypropyl methylcellulose / hypromellose (ISOPTO TEARS / GONIOVISC) 2.5 % ophthalmic solution Place 1 drop into both eyes as needed for dry eyes.    Marland Kitchen lisinopril (PRINIVIL,ZESTRIL) 10 MG tablet Take 1 tablet (10 mg total) by mouth 2 (two) times daily. 180 tablet 3  . magnesium hydroxide (MILK OF MAGNESIA) 400 MG/5ML suspension Take 15 mLs by mouth as needed for mild constipation.    . nitroGLYCERIN (NITROSTAT) 0.4 MG SL tablet Place 1 tablet (0.4 mg total) under the tongue every 5 (five) minutes x 3 doses as needed for chest pain. 25 tablet 3  . olopatadine (PATANOL) 0.1 % ophthalmic solution Place 1 drop into both eyes daily as needed  for allergies.    Marland Kitchen ticagrelor (BRILINTA) 90 MG TABS tablet Take 1 tablet (90 mg total) by mouth 2 (two) times daily. 60 tablet 11   No current facility-administered medications for this visit.    Allergies:   Review of patient's allergies indicates no known allergies.   Social History:  The patient  reports that she has quit smoking. She does not have any smokeless tobacco history on file. She reports that she does not drink alcohol or use illicit drugs.   Family History:  The patient's  family history includes Heart attack in her father; Valvular  heart disease in her mother.   ROS:  Please see the history of present illness.  Otherwise, review of systems is positive for easy bruising, cough, constipation.  All other systems are reviewed and negative.   PHYSICAL EXAM: VS:  BP 150/80 mmHg  Pulse 59  Ht 5\' 3"  (1.6 m)  Wt 211 lb 12.8 oz (96.072 kg)  BMI 37.53 kg/m2 , BMI Body mass index is 37.53 kg/(m^2). GEN: Well nourished, well developed, in no acute distress HEENT: normal Neck: no JVD, no masses. No carotid bruits Cardiac: RRR without murmur or gallop                Respiratory:  clear to auscultation bilaterally, normal work of breathing GI: soft, nontender, nondistended, + BS MS: no deformity or atrophy Ext: no pretibial edema, pedal pulses 2+= bilaterally Skin: warm and dry, no rash Neuro:  Strength and sensation are intact Psych: euthymic mood, full affect  EKG:  EKG is not ordered today.   Recent Labs: 06/02/2015: ALT 55*; B Natriuretic Peptide 371.7* 06/03/2015: Hemoglobin 13.9; Platelets 210 06/18/2015: BUN 15; Creat 0.90; Potassium 4.5; Sodium 140   Lipid Panel     Component Value Date/Time   CHOL 192 06/03/2015 0050   TRIG 131 06/03/2015 0050   HDL 44 06/03/2015 0050   CHOLHDL 4.4 06/03/2015 0050   VLDL 26 06/03/2015 0050   LDLCALC 122* 06/03/2015 0050      Wt Readings from Last 3 Encounters:  07/16/15 211 lb 12.8 oz (96.072 kg)  07/04/15 212 lb 11.9 oz (96.5 kg)  06/11/15 213 lb 1.9 oz (96.671 kg)    Cardiac Studies Reviewed: Cardiac Cath 06-02-2015: Procedures    Left Heart Cath and Coronary Angiography    Conclusion     Mid RCA lesion, 25% stenosed.  Prox Cx lesion, 25% stenosed.  Prox LAD lesion, 100% stenosed. There is a 0% residual stenosis post intervention.  A drug-eluting stent was placed.  There is moderate left ventricular systolic dysfunction.  1. Total occlusion of the mid-LAD at the first diagonal origin, treated successfully with PCI (DES platform) 2. Mild nonobstructive  LCx and RCA stenosis 3. Moderate segmental LV systolic dysfunction with LVEF estimated at 40%  Recommendations: Aggrastat x 6 hours. Pt given brilinta 180 mg in the cath lab but vomited shortly after administration. Will re-dose Brilinta in 2 hours.   2D Echo 06-02-14: Study Conclusions - Left ventricle: The cavity size was normal. Wall thickness was increased in a pattern of mild LVH. Systolic function was mildly reduced. The estimated ejection fraction was in the range of 45% to 50%. Severe hypokinesis of the basal-midanteroseptal myocardium.  ASSESSMENT AND PLAN: 1.  CAD with old MI: No symptoms of angina at present. The patient will be continued on dual antiplatelet therapy with aspirin and brilinta for at least 12 months. Recommend follow-up in 3 months with Richardson Dopp.  2. Chronic systolic heart failure secondary to Ischemic cardiomyopathy: LVEF 45-50% by post MI echo. With ongoing shortness of breath on exertion, will repeat an echocardiogram prior to her return office visit in 3 months. I recommended that she increase carvedilol to 6.25 mg twice daily and continue on lisinopril 10 mg twice daily. No clinical evidence of volume overload. Symptoms are New [place] Heart Association functional class II.  3. Hyperlipidemia: Tolerating atorvastatin at high dose. Lipids and LFTs to be drawn today.  4. Essential hypertension, uncontrolled: Increase carvedilol to 6.25 mg twice daily. Follow-up in 3 months. Continue to monitor blood pressure at cardiac rehabilitation.  Current medicines are reviewed with the patient today.  The patient does not have concerns regarding medicines.  Labs/ tests ordered today include:   Orders Placed This Encounter  Procedures  . Lipid panel  . Hepatic function panel  . Basic Metabolic Panel (BMET)  . Echocardiogram    Disposition:   FU 3 months with Richardson Dopp, PA-C. Echo in 3 months.   Miranda James, MD  07/16/2015 1:37 PM    [facility] [address], [place], [place]  [address] Phone: [phone]; Fax: [phone]

## 2015-07-16 NOTE — Patient Instructions (Signed)
Medication Instructions:  Your physician has recommended you make the following change in your medication:  1. INCREASE Carvedilol to 6.25mg  take one tablet by mouth twice a day  Labwork: Your physician recommends that you have lab work today: LIPID, LIVER and BMP  Testing/Procedures: Your physician has requested that you have an echocardiogram in 3 MONTHS. Echocardiography is a painless test that uses sound waves to create images of your heart. It provides your doctor with information about the size and shape of your heart and how well your heart's chambers and valves are working. This procedure takes approximately one hour. There are no restrictions for this procedure.  Follow-Up: Your physician recommends that you schedule a follow-up appointment in: 3 MONTHS with Richardson Dopp PA-C (Echo prior to appointment)   Any Other Special Instructions Will Be Listed Below (If Applicable).    If you need a refill on your cardiac medications before your next appointment, please call your pharmacy.

## 2015-07-17 ENCOUNTER — Encounter (HOSPITAL_COMMUNITY)
Admission: RE | Admit: 2015-07-17 | Discharge: 2015-07-17 | Disposition: A | Discharge status: Home / Self Care | Payer: Medicare Other | Source: Ambulatory Visit | Attending: Cardiovascular Disease | Admitting: Cardiovascular Disease

## 2015-07-17 DIAGNOSIS — I213 ST elevation (STEMI) myocardial infarction of unspecified site: Secondary | ICD-10-CM | POA: Diagnosis not present

## 2015-07-17 DIAGNOSIS — Z955 Presence of coronary angioplasty implant and graft: Secondary | ICD-10-CM | POA: Diagnosis not present

## 2015-07-17 DIAGNOSIS — Z951 Presence of aortocoronary bypass graft: Secondary | ICD-10-CM | POA: Diagnosis not present

## 2015-07-17 NOTE — Progress Notes (Signed)
Reviewed home exercise with pt today.  Pt plans to continue walking at home for exercise.  Reviewed THR, pulse, RPE, sign and symptoms, NTG use, and when to call 911 or MD.  Pt does have a history of an adverse response to NTG, so she was encouraged to check her BP prior to taking NTG.  Pt voiced understanding. Alberteen Sam, MA, ACSM RCEP

## 2015-07-18 ENCOUNTER — Other Ambulatory Visit: Payer: Self-pay

## 2015-07-18 DIAGNOSIS — R740 Nonspecific elevation of levels of transaminase and lactic acid dehydrogenase [LDH]: Principal | ICD-10-CM

## 2015-07-18 DIAGNOSIS — R7401 Elevation of levels of liver transaminase levels: Secondary | ICD-10-CM

## 2015-07-19 ENCOUNTER — Encounter (HOSPITAL_COMMUNITY)
Admission: RE | Admit: 2015-07-19 | Discharge: 2015-07-19 | Disposition: A | Discharge status: Home / Self Care | Payer: Medicare Other | Source: Ambulatory Visit | Attending: Cardiovascular Disease | Admitting: Cardiovascular Disease

## 2015-07-19 DIAGNOSIS — I213 ST elevation (STEMI) myocardial infarction of unspecified site: Secondary | ICD-10-CM | POA: Diagnosis not present

## 2015-07-19 DIAGNOSIS — Z951 Presence of aortocoronary bypass graft: Secondary | ICD-10-CM | POA: Diagnosis not present

## 2015-07-19 DIAGNOSIS — Z955 Presence of coronary angioplasty implant and graft: Secondary | ICD-10-CM | POA: Diagnosis not present

## 2015-07-22 ENCOUNTER — Encounter (HOSPITAL_COMMUNITY)
Admission: RE | Admit: 2015-07-22 | Discharge: 2015-07-22 | Disposition: A | Discharge status: Home / Self Care | Payer: Medicare Other | Source: Ambulatory Visit | Attending: Cardiovascular Disease | Admitting: Cardiovascular Disease

## 2015-07-22 DIAGNOSIS — Z951 Presence of aortocoronary bypass graft: Secondary | ICD-10-CM | POA: Diagnosis not present

## 2015-07-22 DIAGNOSIS — I213 ST elevation (STEMI) myocardial infarction of unspecified site: Secondary | ICD-10-CM | POA: Diagnosis not present

## 2015-07-22 DIAGNOSIS — Z955 Presence of coronary angioplasty implant and graft: Secondary | ICD-10-CM | POA: Diagnosis not present

## 2015-07-24 ENCOUNTER — Encounter (HOSPITAL_COMMUNITY): Payer: Medicare Other

## 2015-07-29 ENCOUNTER — Encounter (HOSPITAL_COMMUNITY)
Admission: RE | Admit: 2015-07-29 | Discharge: 2015-07-29 | Disposition: A | Discharge status: Home / Self Care | Payer: Medicare Other | Source: Ambulatory Visit | Attending: Cardiovascular Disease | Admitting: Cardiovascular Disease

## 2015-07-29 DIAGNOSIS — Z955 Presence of coronary angioplasty implant and graft: Secondary | ICD-10-CM | POA: Diagnosis not present

## 2015-07-29 DIAGNOSIS — I213 ST elevation (STEMI) myocardial infarction of unspecified site: Secondary | ICD-10-CM | POA: Diagnosis not present

## 2015-07-29 DIAGNOSIS — Z951 Presence of aortocoronary bypass graft: Secondary | ICD-10-CM | POA: Diagnosis not present

## 2015-07-30 ENCOUNTER — Other Ambulatory Visit: Payer: Medicare Other

## 2015-07-31 ENCOUNTER — Encounter (HOSPITAL_COMMUNITY)
Admission: RE | Admit: 2015-07-31 | Discharge: 2015-07-31 | Disposition: A | Discharge status: Home / Self Care | Payer: Medicare Other | Source: Ambulatory Visit | Attending: Cardiovascular Disease | Admitting: Cardiovascular Disease

## 2015-07-31 DIAGNOSIS — Z955 Presence of coronary angioplasty implant and graft: Secondary | ICD-10-CM | POA: Diagnosis not present

## 2015-07-31 DIAGNOSIS — I213 ST elevation (STEMI) myocardial infarction of unspecified site: Secondary | ICD-10-CM | POA: Diagnosis not present

## 2015-07-31 DIAGNOSIS — Z951 Presence of aortocoronary bypass graft: Secondary | ICD-10-CM | POA: Diagnosis not present

## 2015-08-02 ENCOUNTER — Encounter (HOSPITAL_COMMUNITY)
Admission: RE | Admit: 2015-08-02 | Discharge: 2015-08-02 | Disposition: A | Discharge status: Home / Self Care | Payer: Medicare Other | Source: Ambulatory Visit | Attending: Cardiovascular Disease | Admitting: Cardiovascular Disease

## 2015-08-02 DIAGNOSIS — I213 ST elevation (STEMI) myocardial infarction of unspecified site: Secondary | ICD-10-CM | POA: Diagnosis not present

## 2015-08-02 DIAGNOSIS — Z951 Presence of aortocoronary bypass graft: Secondary | ICD-10-CM | POA: Diagnosis not present

## 2015-08-02 DIAGNOSIS — Z955 Presence of coronary angioplasty implant and graft: Secondary | ICD-10-CM | POA: Diagnosis not present

## 2015-08-05 ENCOUNTER — Encounter (HOSPITAL_COMMUNITY)
Admission: RE | Admit: 2015-08-05 | Discharge: 2015-08-05 | Disposition: A | Discharge status: Home / Self Care | Payer: Medicare Other | Source: Ambulatory Visit | Attending: Cardiovascular Disease | Admitting: Cardiovascular Disease

## 2015-08-05 DIAGNOSIS — I213 ST elevation (STEMI) myocardial infarction of unspecified site: Secondary | ICD-10-CM | POA: Diagnosis not present

## 2015-08-05 DIAGNOSIS — Z955 Presence of coronary angioplasty implant and graft: Secondary | ICD-10-CM | POA: Diagnosis not present

## 2015-08-05 DIAGNOSIS — Z951 Presence of aortocoronary bypass graft: Secondary | ICD-10-CM | POA: Diagnosis not present

## 2015-08-07 ENCOUNTER — Encounter (HOSPITAL_COMMUNITY)
Admission: RE | Admit: 2015-08-07 | Discharge: 2015-08-07 | Disposition: A | Discharge status: Home / Self Care | Payer: Medicare Other | Source: Ambulatory Visit | Attending: Cardiovascular Disease | Admitting: Cardiovascular Disease

## 2015-08-07 DIAGNOSIS — I213 ST elevation (STEMI) myocardial infarction of unspecified site: Secondary | ICD-10-CM | POA: Diagnosis not present

## 2015-08-07 DIAGNOSIS — Z951 Presence of aortocoronary bypass graft: Secondary | ICD-10-CM | POA: Diagnosis not present

## 2015-08-07 DIAGNOSIS — Z955 Presence of coronary angioplasty implant and graft: Secondary | ICD-10-CM | POA: Diagnosis not present

## 2015-08-09 ENCOUNTER — Encounter (HOSPITAL_COMMUNITY)
Admission: RE | Admit: 2015-08-09 | Discharge: 2015-08-09 | Disposition: A | Discharge status: Home / Self Care | Payer: Medicare Other | Source: Ambulatory Visit | Attending: Cardiovascular Disease | Admitting: Cardiovascular Disease

## 2015-08-09 DIAGNOSIS — Z951 Presence of aortocoronary bypass graft: Secondary | ICD-10-CM | POA: Diagnosis not present

## 2015-08-09 DIAGNOSIS — Z955 Presence of coronary angioplasty implant and graft: Secondary | ICD-10-CM | POA: Diagnosis not present

## 2015-08-09 DIAGNOSIS — I213 ST elevation (STEMI) myocardial infarction of unspecified site: Secondary | ICD-10-CM | POA: Diagnosis not present

## 2015-08-12 ENCOUNTER — Encounter (HOSPITAL_COMMUNITY)
Admission: RE | Admit: 2015-08-12 | Discharge: 2015-08-12 | Disposition: A | Discharge status: Home / Self Care | Payer: Medicare Other | Source: Ambulatory Visit | Attending: Cardiovascular Disease | Admitting: Cardiovascular Disease

## 2015-08-12 DIAGNOSIS — I213 ST elevation (STEMI) myocardial infarction of unspecified site: Secondary | ICD-10-CM | POA: Diagnosis not present

## 2015-08-12 DIAGNOSIS — Z955 Presence of coronary angioplasty implant and graft: Secondary | ICD-10-CM | POA: Diagnosis not present

## 2015-08-12 DIAGNOSIS — Z951 Presence of aortocoronary bypass graft: Secondary | ICD-10-CM | POA: Diagnosis not present

## 2015-08-14 ENCOUNTER — Encounter (HOSPITAL_COMMUNITY)
Admission: RE | Admit: 2015-08-14 | Discharge: 2015-08-14 | Disposition: A | Discharge status: Home / Self Care | Payer: Medicare Other | Source: Ambulatory Visit | Attending: Cardiovascular Disease | Admitting: Cardiovascular Disease

## 2015-08-14 DIAGNOSIS — Z951 Presence of aortocoronary bypass graft: Secondary | ICD-10-CM | POA: Diagnosis not present

## 2015-08-14 DIAGNOSIS — Z955 Presence of coronary angioplasty implant and graft: Secondary | ICD-10-CM | POA: Diagnosis not present

## 2015-08-14 DIAGNOSIS — I213 ST elevation (STEMI) myocardial infarction of unspecified site: Secondary | ICD-10-CM | POA: Diagnosis not present

## 2015-08-16 ENCOUNTER — Encounter (HOSPITAL_COMMUNITY)
Admission: RE | Admit: 2015-08-16 | Discharge: 2015-08-16 | Disposition: A | Discharge status: Home / Self Care | Payer: Medicare Other | Source: Ambulatory Visit | Attending: Cardiovascular Disease | Admitting: Cardiovascular Disease

## 2015-08-16 DIAGNOSIS — Z955 Presence of coronary angioplasty implant and graft: Secondary | ICD-10-CM | POA: Diagnosis not present

## 2015-08-16 DIAGNOSIS — Z951 Presence of aortocoronary bypass graft: Secondary | ICD-10-CM | POA: Diagnosis not present

## 2015-08-16 DIAGNOSIS — I213 ST elevation (STEMI) myocardial infarction of unspecified site: Secondary | ICD-10-CM | POA: Diagnosis not present

## 2015-08-19 ENCOUNTER — Encounter (HOSPITAL_COMMUNITY)
Admission: RE | Admit: 2015-08-19 | Discharge: 2015-08-19 | Disposition: A | Discharge status: Home / Self Care | Payer: Medicare Other | Source: Ambulatory Visit | Attending: Cardiovascular Disease | Admitting: Cardiovascular Disease

## 2015-08-19 DIAGNOSIS — I213 ST elevation (STEMI) myocardial infarction of unspecified site: Secondary | ICD-10-CM | POA: Diagnosis not present

## 2015-08-19 DIAGNOSIS — Z955 Presence of coronary angioplasty implant and graft: Secondary | ICD-10-CM | POA: Diagnosis not present

## 2015-08-19 DIAGNOSIS — Z951 Presence of aortocoronary bypass graft: Secondary | ICD-10-CM | POA: Diagnosis not present

## 2015-08-21 ENCOUNTER — Encounter (HOSPITAL_COMMUNITY)
Admission: RE | Admit: 2015-08-21 | Discharge: 2015-08-21 | Disposition: A | Discharge status: Home / Self Care | Payer: Medicare Other | Source: Ambulatory Visit | Attending: Cardiovascular Disease | Admitting: Cardiovascular Disease

## 2015-08-21 DIAGNOSIS — I213 ST elevation (STEMI) myocardial infarction of unspecified site: Secondary | ICD-10-CM | POA: Diagnosis not present

## 2015-08-21 DIAGNOSIS — Z951 Presence of aortocoronary bypass graft: Secondary | ICD-10-CM | POA: Diagnosis not present

## 2015-08-21 DIAGNOSIS — Z955 Presence of coronary angioplasty implant and graft: Secondary | ICD-10-CM | POA: Diagnosis not present

## 2015-08-23 ENCOUNTER — Encounter (HOSPITAL_COMMUNITY)
Admission: RE | Admit: 2015-08-23 | Discharge: 2015-08-23 | Disposition: A | Discharge status: Home / Self Care | Payer: Medicare Other | Source: Ambulatory Visit | Attending: Cardiovascular Disease | Admitting: Cardiovascular Disease

## 2015-08-23 DIAGNOSIS — I213 ST elevation (STEMI) myocardial infarction of unspecified site: Secondary | ICD-10-CM | POA: Diagnosis not present

## 2015-08-23 DIAGNOSIS — Z955 Presence of coronary angioplasty implant and graft: Secondary | ICD-10-CM | POA: Diagnosis not present

## 2015-08-23 DIAGNOSIS — Z951 Presence of aortocoronary bypass graft: Secondary | ICD-10-CM | POA: Diagnosis not present

## 2015-08-26 ENCOUNTER — Encounter (HOSPITAL_COMMUNITY): Payer: Medicare Other

## 2015-08-28 ENCOUNTER — Encounter (HOSPITAL_COMMUNITY)
Admission: RE | Admit: 2015-08-28 | Discharge: 2015-08-28 | Disposition: A | Discharge status: Home / Self Care | Payer: Medicare Other | Source: Ambulatory Visit | Attending: Cardiovascular Disease | Admitting: Cardiovascular Disease

## 2015-08-28 DIAGNOSIS — I213 ST elevation (STEMI) myocardial infarction of unspecified site: Secondary | ICD-10-CM | POA: Diagnosis not present

## 2015-08-28 DIAGNOSIS — Z951 Presence of aortocoronary bypass graft: Secondary | ICD-10-CM | POA: Diagnosis not present

## 2015-08-28 DIAGNOSIS — Z955 Presence of coronary angioplasty implant and graft: Secondary | ICD-10-CM | POA: Diagnosis not present

## 2015-08-30 ENCOUNTER — Encounter (HOSPITAL_COMMUNITY)
Admission: RE | Admit: 2015-08-30 | Discharge: 2015-08-30 | Disposition: A | Discharge status: Home / Self Care | Payer: Medicare Other | Source: Ambulatory Visit | Attending: Cardiovascular Disease | Admitting: Cardiovascular Disease

## 2015-08-30 DIAGNOSIS — I213 ST elevation (STEMI) myocardial infarction of unspecified site: Secondary | ICD-10-CM | POA: Diagnosis not present

## 2015-08-30 DIAGNOSIS — Z955 Presence of coronary angioplasty implant and graft: Secondary | ICD-10-CM | POA: Diagnosis not present

## 2015-08-30 DIAGNOSIS — Z951 Presence of aortocoronary bypass graft: Secondary | ICD-10-CM | POA: Diagnosis not present

## 2015-09-02 ENCOUNTER — Encounter (HOSPITAL_COMMUNITY): Payer: Medicare Other

## 2015-09-02 DIAGNOSIS — I213 ST elevation (STEMI) myocardial infarction of unspecified site: Secondary | ICD-10-CM | POA: Insufficient documentation

## 2015-09-02 DIAGNOSIS — Z951 Presence of aortocoronary bypass graft: Secondary | ICD-10-CM | POA: Insufficient documentation

## 2015-09-02 DIAGNOSIS — Z955 Presence of coronary angioplasty implant and graft: Secondary | ICD-10-CM | POA: Insufficient documentation

## 2015-09-04 ENCOUNTER — Encounter (HOSPITAL_COMMUNITY)
Admission: RE | Admit: 2015-09-04 | Discharge: 2015-09-04 | Disposition: A | Discharge status: Home / Self Care | Payer: Medicare Other | Source: Ambulatory Visit | Attending: Cardiovascular Disease | Admitting: Cardiovascular Disease

## 2015-09-04 DIAGNOSIS — Z951 Presence of aortocoronary bypass graft: Secondary | ICD-10-CM | POA: Diagnosis not present

## 2015-09-04 DIAGNOSIS — Z955 Presence of coronary angioplasty implant and graft: Secondary | ICD-10-CM | POA: Diagnosis not present

## 2015-09-04 DIAGNOSIS — I213 ST elevation (STEMI) myocardial infarction of unspecified site: Secondary | ICD-10-CM | POA: Diagnosis not present

## 2015-09-06 ENCOUNTER — Encounter (HOSPITAL_COMMUNITY)
Admission: RE | Admit: 2015-09-06 | Discharge: 2015-09-06 | Disposition: A | Discharge status: Home / Self Care | Payer: Medicare Other | Source: Ambulatory Visit | Attending: Cardiovascular Disease | Admitting: Cardiovascular Disease

## 2015-09-06 DIAGNOSIS — Z951 Presence of aortocoronary bypass graft: Secondary | ICD-10-CM | POA: Diagnosis not present

## 2015-09-06 DIAGNOSIS — I213 ST elevation (STEMI) myocardial infarction of unspecified site: Secondary | ICD-10-CM | POA: Diagnosis not present

## 2015-09-06 DIAGNOSIS — Z955 Presence of coronary angioplasty implant and graft: Secondary | ICD-10-CM | POA: Diagnosis not present

## 2015-09-09 ENCOUNTER — Telehealth (HOSPITAL_COMMUNITY): Payer: Self-pay | Admitting: *Deleted

## 2015-09-09 ENCOUNTER — Encounter (HOSPITAL_COMMUNITY): Admission: RE | Admit: 2015-09-09 | Payer: Medicare Other | Source: Ambulatory Visit

## 2015-09-11 ENCOUNTER — Encounter (HOSPITAL_COMMUNITY)
Admission: RE | Admit: 2015-09-11 | Discharge: 2015-09-11 | Disposition: A | Discharge status: Home / Self Care | Payer: Medicare Other | Source: Ambulatory Visit | Attending: Cardiovascular Disease | Admitting: Cardiovascular Disease

## 2015-09-11 DIAGNOSIS — Z955 Presence of coronary angioplasty implant and graft: Secondary | ICD-10-CM | POA: Diagnosis not present

## 2015-09-11 DIAGNOSIS — Z951 Presence of aortocoronary bypass graft: Secondary | ICD-10-CM | POA: Diagnosis not present

## 2015-09-11 DIAGNOSIS — I213 ST elevation (STEMI) myocardial infarction of unspecified site: Secondary | ICD-10-CM | POA: Diagnosis not present

## 2015-09-13 ENCOUNTER — Encounter (HOSPITAL_COMMUNITY)
Admission: RE | Admit: 2015-09-13 | Discharge: 2015-09-13 | Disposition: A | Discharge status: Home / Self Care | Payer: Medicare Other | Source: Ambulatory Visit | Attending: Cardiovascular Disease | Admitting: Cardiovascular Disease

## 2015-09-13 DIAGNOSIS — I213 ST elevation (STEMI) myocardial infarction of unspecified site: Secondary | ICD-10-CM | POA: Diagnosis not present

## 2015-09-13 DIAGNOSIS — Z955 Presence of coronary angioplasty implant and graft: Secondary | ICD-10-CM | POA: Diagnosis not present

## 2015-09-13 DIAGNOSIS — Z951 Presence of aortocoronary bypass graft: Secondary | ICD-10-CM | POA: Diagnosis not present

## 2015-09-16 ENCOUNTER — Encounter (HOSPITAL_COMMUNITY)
Admission: RE | Admit: 2015-09-16 | Discharge: 2015-09-16 | Disposition: A | Discharge status: Home / Self Care | Payer: Medicare Other | Source: Ambulatory Visit | Attending: Cardiovascular Disease | Admitting: Cardiovascular Disease

## 2015-09-16 DIAGNOSIS — Z955 Presence of coronary angioplasty implant and graft: Secondary | ICD-10-CM | POA: Diagnosis not present

## 2015-09-16 DIAGNOSIS — I213 ST elevation (STEMI) myocardial infarction of unspecified site: Secondary | ICD-10-CM | POA: Diagnosis not present

## 2015-09-16 DIAGNOSIS — Z951 Presence of aortocoronary bypass graft: Secondary | ICD-10-CM | POA: Diagnosis not present

## 2015-09-16 NOTE — Progress Notes (Signed)
Miranda Wong 70 y.o. female Nutrition Note Spoke with pt.  Nutrition Survey reviewed with pt. Pt is following Step 1 of the Therapeutic Lifestyle Changes diet. Per discussion, pt does not like most vegetables and pt does not want to try to incorporate more veggies into her diet at this time. Pt eats 1 meal/d, which she does not intend to change. Pt sips on a 16 ounce regular Pepsi throughout the day. Recommended daily sugar intake discussed. Pt wants to lose wt. Pt has been losing wt. Pt states she has lost 12 lb since her pre-hospital wt of 219 lb. Wt loss tips briefly reviewed. Pt expressed understanding of the information reviewed. Pt aware of nutrition education classes offered. No results found for: HGBA1C Wt Readings from Last 3 Encounters:  07/16/15 211 lb 12.8 oz (96.072 kg)  07/04/15 212 lb 11.9 oz (96.5 kg)  06/11/15 213 lb 1.9 oz (96.671 kg)   Nutrition Diagnosis ? Food-and nutrition-related knowledge deficit related to lack of exposure to information as related to diagnosis of: ? CVD ? Obesity related to excessive energy intake as evidenced by a BMI of 37.4  Nutrition Intervention ? Benefits of adopting Therapeutic Lifestyle Changes discussed when Medficts reviewed. ? Pt to attend the Portion Distortion class ? Pt to attend the  ? Nutrition I class                     ? Nutrition II class ? Pt given handouts for: ? Nutrition I class ? Nutrition II class ? Continue client-centered nutrition education by RD, as part of interdisciplinary care.  Goal(s) ? Pt to identify and limit food sources of saturated fat, trans fat, and cholesterol ? Pt to identify food quantities necessary to achieve: ? wt loss to a goal wt loss of 6-24 lb (2.7-10.9 kg) at graduation from cardiac rehab.   Monitor and Evaluate progress toward nutrition goal with team.  Derek Mound, M.Ed, RD, LDN, CDE 09/16/2015 3:32 PM

## 2015-09-18 ENCOUNTER — Encounter (HOSPITAL_COMMUNITY)
Admission: RE | Admit: 2015-09-18 | Discharge: 2015-09-18 | Disposition: A | Discharge status: Home / Self Care | Payer: Medicare Other | Source: Ambulatory Visit | Attending: Cardiovascular Disease | Admitting: Cardiovascular Disease

## 2015-09-18 DIAGNOSIS — Z951 Presence of aortocoronary bypass graft: Secondary | ICD-10-CM | POA: Diagnosis not present

## 2015-09-18 DIAGNOSIS — Z955 Presence of coronary angioplasty implant and graft: Secondary | ICD-10-CM | POA: Diagnosis not present

## 2015-09-18 DIAGNOSIS — I213 ST elevation (STEMI) myocardial infarction of unspecified site: Secondary | ICD-10-CM | POA: Diagnosis not present

## 2015-09-20 ENCOUNTER — Encounter (HOSPITAL_COMMUNITY)
Admission: RE | Admit: 2015-09-20 | Discharge: 2015-09-20 | Disposition: A | Discharge status: Home / Self Care | Payer: Medicare Other | Source: Ambulatory Visit | Attending: Cardiovascular Disease | Admitting: Cardiovascular Disease

## 2015-09-20 DIAGNOSIS — Z951 Presence of aortocoronary bypass graft: Secondary | ICD-10-CM | POA: Diagnosis not present

## 2015-09-20 DIAGNOSIS — I213 ST elevation (STEMI) myocardial infarction of unspecified site: Secondary | ICD-10-CM | POA: Diagnosis not present

## 2015-09-20 DIAGNOSIS — Z955 Presence of coronary angioplasty implant and graft: Secondary | ICD-10-CM | POA: Diagnosis not present

## 2015-09-23 ENCOUNTER — Encounter (HOSPITAL_COMMUNITY)
Admission: RE | Admit: 2015-09-23 | Discharge: 2015-09-23 | Disposition: A | Discharge status: Home / Self Care | Payer: Medicare Other | Source: Ambulatory Visit | Attending: Cardiovascular Disease | Admitting: Cardiovascular Disease

## 2015-09-23 DIAGNOSIS — Z955 Presence of coronary angioplasty implant and graft: Secondary | ICD-10-CM | POA: Diagnosis not present

## 2015-09-23 DIAGNOSIS — Z951 Presence of aortocoronary bypass graft: Secondary | ICD-10-CM | POA: Diagnosis not present

## 2015-09-23 DIAGNOSIS — I213 ST elevation (STEMI) myocardial infarction of unspecified site: Secondary | ICD-10-CM | POA: Diagnosis not present

## 2015-09-25 ENCOUNTER — Encounter (HOSPITAL_COMMUNITY)
Admission: RE | Admit: 2015-09-25 | Discharge: 2015-09-25 | Disposition: A | Discharge status: Home / Self Care | Payer: Medicare Other | Source: Ambulatory Visit | Attending: Cardiovascular Disease | Admitting: Cardiovascular Disease

## 2015-09-25 DIAGNOSIS — I213 ST elevation (STEMI) myocardial infarction of unspecified site: Secondary | ICD-10-CM | POA: Diagnosis not present

## 2015-09-25 DIAGNOSIS — Z955 Presence of coronary angioplasty implant and graft: Secondary | ICD-10-CM | POA: Diagnosis not present

## 2015-09-25 DIAGNOSIS — Z951 Presence of aortocoronary bypass graft: Secondary | ICD-10-CM | POA: Diagnosis not present

## 2015-09-27 ENCOUNTER — Encounter (HOSPITAL_COMMUNITY)
Admission: RE | Admit: 2015-09-27 | Discharge: 2015-09-27 | Disposition: A | Discharge status: Home / Self Care | Payer: Medicare Other | Source: Ambulatory Visit | Attending: Cardiovascular Disease | Admitting: Cardiovascular Disease

## 2015-09-27 DIAGNOSIS — I213 ST elevation (STEMI) myocardial infarction of unspecified site: Secondary | ICD-10-CM | POA: Diagnosis not present

## 2015-09-27 DIAGNOSIS — Z951 Presence of aortocoronary bypass graft: Secondary | ICD-10-CM | POA: Diagnosis not present

## 2015-09-27 DIAGNOSIS — Z955 Presence of coronary angioplasty implant and graft: Secondary | ICD-10-CM | POA: Diagnosis not present

## 2015-09-30 ENCOUNTER — Encounter (HOSPITAL_COMMUNITY)
Admission: RE | Admit: 2015-09-30 | Discharge: 2015-09-30 | Disposition: A | Discharge status: Home / Self Care | Payer: Medicare Other | Source: Ambulatory Visit | Attending: Cardiovascular Disease | Admitting: Cardiovascular Disease

## 2015-09-30 DIAGNOSIS — I213 ST elevation (STEMI) myocardial infarction of unspecified site: Secondary | ICD-10-CM | POA: Diagnosis not present

## 2015-09-30 DIAGNOSIS — Z951 Presence of aortocoronary bypass graft: Secondary | ICD-10-CM | POA: Diagnosis not present

## 2015-09-30 DIAGNOSIS — Z955 Presence of coronary angioplasty implant and graft: Secondary | ICD-10-CM | POA: Diagnosis not present

## 2015-10-02 ENCOUNTER — Encounter (HOSPITAL_COMMUNITY)
Admission: RE | Admit: 2015-10-02 | Discharge: 2015-10-02 | Disposition: A | Discharge status: Home / Self Care | Payer: Medicare Other | Source: Ambulatory Visit | Attending: Cardiovascular Disease | Admitting: Cardiovascular Disease

## 2015-10-02 DIAGNOSIS — Z955 Presence of coronary angioplasty implant and graft: Secondary | ICD-10-CM | POA: Diagnosis not present

## 2015-10-02 DIAGNOSIS — Z951 Presence of aortocoronary bypass graft: Secondary | ICD-10-CM | POA: Diagnosis not present

## 2015-10-02 DIAGNOSIS — I213 ST elevation (STEMI) myocardial infarction of unspecified site: Secondary | ICD-10-CM | POA: Insufficient documentation

## 2015-10-04 ENCOUNTER — Encounter (HOSPITAL_COMMUNITY)
Admission: RE | Admit: 2015-10-04 | Discharge: 2015-10-04 | Disposition: A | Discharge status: Home / Self Care | Payer: Medicare Other | Source: Ambulatory Visit | Attending: Cardiovascular Disease | Admitting: Cardiovascular Disease

## 2015-10-04 DIAGNOSIS — I213 ST elevation (STEMI) myocardial infarction of unspecified site: Secondary | ICD-10-CM | POA: Diagnosis not present

## 2015-10-04 DIAGNOSIS — Z955 Presence of coronary angioplasty implant and graft: Secondary | ICD-10-CM | POA: Diagnosis not present

## 2015-10-04 DIAGNOSIS — Z951 Presence of aortocoronary bypass graft: Secondary | ICD-10-CM | POA: Diagnosis not present

## 2015-10-07 ENCOUNTER — Encounter (HOSPITAL_COMMUNITY)
Admission: RE | Admit: 2015-10-07 | Discharge: 2015-10-07 | Disposition: A | Discharge status: Home / Self Care | Payer: Medicare Other | Source: Ambulatory Visit | Attending: Cardiovascular Disease | Admitting: Cardiovascular Disease

## 2015-10-07 DIAGNOSIS — I213 ST elevation (STEMI) myocardial infarction of unspecified site: Secondary | ICD-10-CM | POA: Diagnosis not present

## 2015-10-07 DIAGNOSIS — Z951 Presence of aortocoronary bypass graft: Secondary | ICD-10-CM | POA: Diagnosis not present

## 2015-10-07 DIAGNOSIS — Z955 Presence of coronary angioplasty implant and graft: Secondary | ICD-10-CM | POA: Diagnosis not present

## 2015-10-09 ENCOUNTER — Encounter (HOSPITAL_COMMUNITY)
Admission: RE | Admit: 2015-10-09 | Discharge: 2015-10-09 | Disposition: A | Discharge status: Home / Self Care | Payer: Medicare Other | Source: Ambulatory Visit | Attending: Cardiovascular Disease | Admitting: Cardiovascular Disease

## 2015-10-09 DIAGNOSIS — Z955 Presence of coronary angioplasty implant and graft: Secondary | ICD-10-CM | POA: Diagnosis not present

## 2015-10-09 DIAGNOSIS — I213 ST elevation (STEMI) myocardial infarction of unspecified site: Secondary | ICD-10-CM | POA: Diagnosis not present

## 2015-10-09 DIAGNOSIS — Z951 Presence of aortocoronary bypass graft: Secondary | ICD-10-CM | POA: Diagnosis not present

## 2015-10-09 NOTE — Progress Notes (Signed)
Pt graduated from cardiac rehab program today with completion of 36 exercise sessions in Phase II. Pt maintained good attendance and progressed nicely during his participation in rehab as evidenced by increased MET level.   Medication list reconciled. Repeat  PHQ score- 0 .  Pt has made significant lifestyle changes and should be commended for her success. Pt feels she has achieved her goals during cardiac rehab.   Pt plans to continue exercise by walking on her own.

## 2015-10-10 ENCOUNTER — Other Ambulatory Visit (INDEPENDENT_AMBULATORY_CARE_PROVIDER_SITE_OTHER): Payer: Medicare Other | Admitting: *Deleted

## 2015-10-10 ENCOUNTER — Other Ambulatory Visit: Payer: Self-pay

## 2015-10-10 ENCOUNTER — Ambulatory Visit (HOSPITAL_COMMUNITY): Payer: Medicare Other | Attending: Internal Medicine

## 2015-10-10 DIAGNOSIS — R7401 Elevation of levels of liver transaminase levels: Secondary | ICD-10-CM

## 2015-10-10 DIAGNOSIS — E785 Hyperlipidemia, unspecified: Secondary | ICD-10-CM | POA: Insufficient documentation

## 2015-10-10 DIAGNOSIS — I071 Rheumatic tricuspid insufficiency: Secondary | ICD-10-CM | POA: Insufficient documentation

## 2015-10-10 DIAGNOSIS — R740 Nonspecific elevation of levels of transaminase and lactic acid dehydrogenase [LDH]: Secondary | ICD-10-CM

## 2015-10-10 DIAGNOSIS — Z87891 Personal history of nicotine dependence: Secondary | ICD-10-CM | POA: Insufficient documentation

## 2015-10-10 DIAGNOSIS — R0602 Shortness of breath: Secondary | ICD-10-CM | POA: Diagnosis not present

## 2015-10-10 DIAGNOSIS — I252 Old myocardial infarction: Secondary | ICD-10-CM | POA: Diagnosis not present

## 2015-10-10 DIAGNOSIS — R0600 Dyspnea, unspecified: Secondary | ICD-10-CM | POA: Diagnosis present

## 2015-10-10 LAB — HEPATIC FUNCTION PANEL
ALBUMIN: 4 g/dL (ref 3.6–5.1)
ALT: 29 U/L (ref 6–29)
AST: 36 U/L — ABNORMAL HIGH (ref 10–35)
Alkaline Phosphatase: 118 U/L (ref 33–130)
BILIRUBIN DIRECT: 0.2 mg/dL (ref <=0.2)
BILIRUBIN TOTAL: 0.8 mg/dL (ref 0.2–1.2)
Indirect Bilirubin: 0.6 mg/dL (ref 0.2–1.2)
Total Protein: 7.1 g/dL (ref 6.1–8.1)

## 2015-10-10 NOTE — Addendum Note (Signed)
Addended by: Eulis Foster on: 10/10/2015 08:59 AM   Modules accepted: Orders

## 2015-10-11 ENCOUNTER — Encounter (HOSPITAL_COMMUNITY): Payer: Medicare Other

## 2015-10-13 NOTE — Progress Notes (Signed)
Cardiology Office Note:    Date:  10/14/2015   ID:  Miranda Wong, DOB 1946/06/12, MRN [id number]  PCP:  No PCP Per Patient  Cardiologist:  Dr. Sherren Mocha   Electrophysiologist:  n/a  Chief Complaint  Patient presents with  . Coronary Artery Disease    Follow up     History of Present Illness:     Miranda Wong is a 70 y.o. female former smoker with a hx of CAD s/p anterior non-STEMI in 10/16.  Urgent LHC demonstrated an occluded proximal LAD that was treated with a Xience DES. Ejection fraction was reduced and follow-up echo demonstrated EF 45-50%. Last seen by Dr. Sherren Mocha 11/16.  FU Echo was arranged.  This was done 10/10/15 and demonstrated vigorous LVF with EF [id number], mild diastolic dysfunction.  She returns for FU.    She is doing well.  She has finished cardiac rehab.  She still gets short of breath. It seems to occur after taking Brilinta. It has not gotten any better and she will often skip her Brilinta if she is going out in the AM to avoid dyspnea.  She denies chest pain.  She denies syncope.  She denies orthopnea, PND, edema.  No bleeding issues.    Past Medical History  Diagnosis Date  . NSTEMI (non-ST elevated myocardial infarction) ([place]) 06/02/2015  . Hyperlipidemia   . Mild ichemic cardiomyopathy, EF 45-50% post cath 06/2015 06/04/2015  . CAD (coronary artery disease)     cath 06/02/2015 25% mid RCA, 25% prox LCx, 100% prox LAD treated with DES (3.0 x 15 mm Xience), EF 40%  . Former smoker     Past Surgical History  Procedure Laterality Date  . Cholecystectomy    . Cardiac catheterization N/A 06/02/2015    Procedure: Left Heart Cath and Coronary Angiography;  Surgeon: Sherren Mocha, MD;  Location: [facility];  Service: Cardiovascular;  Laterality: N/A;    Current Medications: Outpatient Prescriptions Prior to Visit  Medication Sig Dispense Refill  . acetaminophen (TYLENOL) 325 MG tablet Take 650 mg by mouth every 6 (six) hours as needed  for headache.    Marland Kitchen aspirin EC 81 MG EC tablet Take 1 tablet (81 mg total) by mouth daily.    Marland Kitchen atorvastatin (LIPITOR) 80 MG tablet Take 1 tablet (80 mg total) by mouth daily at 6 PM. 90 tablet 3  . b complex vitamins tablet Take 1 tablet by mouth every other day.    . carvedilol (COREG) 6.25 MG tablet Take 1 tablet (6.25 mg total) by mouth 2 (two) times daily with a meal. 60 tablet 6  . fluticasone (FLONASE) 50 MCG/ACT nasal spray Place 2 sprays into both nostrils as needed for allergies or rhinitis.    . hydroxypropyl methylcellulose / hypromellose (ISOPTO TEARS / GONIOVISC) 2.5 % ophthalmic solution Place 1 drop into both eyes as needed for dry eyes.    Marland Kitchen lisinopril (PRINIVIL,ZESTRIL) 10 MG tablet Take 1 tablet (10 mg total) by mouth 2 (two) times daily. 180 tablet 3  . nitroGLYCERIN (NITROSTAT) 0.4 MG SL tablet Place 1 tablet (0.4 mg total) under the tongue every 5 (five) minutes x 3 doses as needed for chest pain. 25 tablet 3  . olopatadine (PATANOL) 0.1 % ophthalmic solution Place 1 drop into both eyes daily as needed for allergies.    Marland Kitchen ticagrelor (BRILINTA) 90 MG TABS tablet Take 1 tablet (90 mg total) by mouth 2 (two) times daily. 60 tablet 11  No facility-administered medications prior to visit.     Allergies:   Review of patient's allergies indicates no known allergies.   Social History   Social History  . Marital Status: Legally Separated    Spouse Name: N/A  . Number of Children: N/A  . Years of Education: N/A   Social History Main Topics  . Smoking status: Former Research scientist (life sciences)  . Smokeless tobacco: None     Comment: quit when she was 15s  . Alcohol Use: No  . Drug Use: No  . Sexual Activity: Yes    Birth Control/ Protection: None   Other Topics Concern  . None   Social History Narrative     Family History:  The patient's family history includes Heart attack in her father; Valvular heart disease in her mother.   ROS:   Please see the history of present illness.      Review of Systems  Hematologic/Lymphatic: Bruises/bleeds easily.  Musculoskeletal: Positive for joint pain and myalgias.  All other systems reviewed and are negative.   Physical Exam:    VS:  BP 148/78 mmHg  Pulse 64  Ht 5\' 3"  (1.6 m)  Wt 205 lb (92.987 kg)  BMI 36.32 kg/m2   GEN: Well nourished, well developed, in no acute distress HEENT: normal Neck: no JVD, no masses Cardiac: Normal S1/S2, RRR; no murmurs no edema  Respiratory:  clear to auscultation bilaterally; no wheezing, rhonchi or rales GI: soft, nontender  MS: no deformity or atrophy Skin: warm and dry, no rash Neuro:  no focal deficits  Psych: Alert and oriented x 3, normal affect  Wt Readings from Last 3 Encounters:  10/14/15 205 lb (92.987 kg)  07/16/15 211 lb 12.8 oz (96.072 kg)  07/04/15 212 lb 11.9 oz (96.5 kg)      Studies/Labs Reviewed:     EKG:  EKG is  ordered today.  The ekg ordered today demonstrates NSR, HR 65, LAD, NSSTTW changes  Recent Labs: 06/02/2015: B Natriuretic Peptide 371.7* 06/03/2015: Hemoglobin 13.9; Platelets 210 07/16/2015: BUN 12; Creat 0.77; Potassium 4.2; Sodium 139 10/10/2015: ALT 29   Recent Lipid Panel    Component Value Date/Time   CHOL 128 07/16/2015 0945   TRIG 101 07/16/2015 0945   HDL 54 07/16/2015 0945   CHOLHDL 2.4 07/16/2015 0945   VLDL 20 07/16/2015 0945   LDLCALC 54 07/16/2015 0945    Additional studies/ records that were reviewed today include:   Echo 10/10/15 Vigorous LVF, EF 65-70%, normal wall motion, grade 1 diastolic dysfunction, normal RVSF, mild TR, PASP 38 mmHg  Echo 06/03/15 Mild LVH, EF 45-50%, anteroseptal HK  LHC 06/02/15 LAD: Proximal 100% >> PCI: 3 x 15 mm Xience DES LCx: Proximal 25% RCA: Mid 25% EF 35-45% 1. Total occlusion of the mid-LAD at the first diagonal origin, treated successfully with PCI (DES platform) 2. Mild nonobstructive LCx and RCA stenosis 3. Moderate segmental LV systolic dysfunction with LVEF estimated at  40% Recommendations: Aggrastat x 6 hours. Pt given brilinta 180 mg in the cath lab but vomited shortly after administration. Will re-dose Brilinta in 2 hours  ASSESSMENT:     1. Coronary artery disease involving native coronary artery of native heart without angina pectoris   2. Ischemic cardiomyopathy   3. Hyperlipidemia   4. Essential hypertension      PLAN:     In order of problems listed above:  1. CAD: S/p NSTEMI treated with DES to LAD in 10/16. No angina.  She is still  having issues with dyspnea from [place].   -  Continue ASA, beta-blocker, ACE inhibitor, statin.   -  DC Brilinta  -  Start Plavix 75 mg QD  2. Ischemic Cardiomyopathy: EF 45-50% by echo shortly after MI.  Echo last week with recovered LVF and EF 65-70%.  Continue beta-blocker, ACE inhibitor.     3. Hyperlipidemia: She has been having myalgias.  Recent LDL optimal.  -  Hold Lipitor for 2 weeks.  -  If better >> change to Crestor 20 QD  -  If no change, resume Lipitor     4. HTN:  BP elevated today. She tells me her BP was optimal at Cardiac Rehab.  -  Continue current Rx  -  Check BP QD and call with readings after 2 weeks.  If 140/90 or higher, increase Lisinopril to 20 bid.    Medication Adjustments/Labs and Tests Ordered: Current medicines are reviewed at length with the patient today.  Concerns regarding medicines are outlined above.  Medication changes, Labs and Tests ordered today are outlined in the Patient Instructions noted below. Patient Instructions  Medication Instructions:  STOP Brilinta - take your last dose tonight.  START Plavix 75 mg Once daily - start it tomorrow morning (when you would normally take Brilinta)  HOLD Lipitor (Atorvastatin) for 2-3 weeks.    If your muscle pain gets better off of the Lipitor >> call us.  If your muscle pain does not change with holding the Lipitor >> resume it  Labwork: None today.  Testing/Procedures: None   Follow-Up: Your physician  wants you to follow-up in: [place]. You will receive a reminder letter in the mail two months in advance. If you don't receive a letter, please call our office to schedule the follow-up appointment.   Any Other Special Instructions Will Be Listed Below (If Applicable). Check your blood pressure daily for 2 weeks. Call Richardson Dopp, PA-C with the record of BP after 2 weeks.  If you need a refill on your cardiac medications before your next appointment, please call your pharmacy.     Signed, Richardson Dopp, PA-C  10/14/2015 10:36 AM    [facility] [place], [place], [place]  [address] Phone: [phone]; Fax: [phone]

## 2015-10-14 ENCOUNTER — Telehealth: Payer: Self-pay | Admitting: Cardiovascular Disease

## 2015-10-14 ENCOUNTER — Ambulatory Visit (INDEPENDENT_AMBULATORY_CARE_PROVIDER_SITE_OTHER): Payer: Medicare Other | Admitting: Physician Assistant

## 2015-10-14 ENCOUNTER — Encounter: Payer: Self-pay | Admitting: Physician Assistant

## 2015-10-14 VITALS — BP 148/78 | HR 64 | Ht 63.0 in | Wt 205.0 lb

## 2015-10-14 DIAGNOSIS — I2510 Atherosclerotic heart disease of native coronary artery without angina pectoris: Secondary | ICD-10-CM

## 2015-10-14 DIAGNOSIS — I255 Ischemic cardiomyopathy: Secondary | ICD-10-CM

## 2015-10-14 DIAGNOSIS — I10 Essential (primary) hypertension: Secondary | ICD-10-CM | POA: Diagnosis not present

## 2015-10-14 DIAGNOSIS — E785 Hyperlipidemia, unspecified: Secondary | ICD-10-CM | POA: Diagnosis not present

## 2015-10-14 MED ORDER — CLOPIDOGREL BISULFATE 75 MG PO TABS: 75.0000 mg | ORAL_TABLET | Freq: Every day | ORAL | Status: DC

## 2015-10-14 NOTE — Telephone Encounter (Signed)
F/u pt returning RN phone call/ Please call back and discuss.

## 2015-10-14 NOTE — Patient Instructions (Addendum)
Medication Instructions:  STOP Brilinta - take your last dose tonight.  START Plavix 75 mg Once daily - start it tomorrow morning (when you would normally take Brilinta)  HOLD Lipitor (Atorvastatin) for 2-3 weeks.    If your muscle pain gets better off of the Lipitor >> call us.  If your muscle pain does not change with holding the Lipitor >> resume it  Labwork: None today.  Testing/Procedures: None   Follow-Up: Your physician wants you to follow-up in: [place]. You will receive a reminder letter in the mail two months in advance. If you don't receive a letter, please call our office to schedule the follow-up appointment.   Any Other Special Instructions Will Be Listed Below (If Applicable). Check your blood pressure daily for 2 weeks. Call Richardson Dopp, PA-C with the record of BP after 2 weeks.  If you need a refill on your cardiac medications before your next appointment, please call your pharmacy.

## 2016-03-26 ENCOUNTER — Encounter: Payer: Self-pay | Admitting: Family Medicine

## 2016-03-26 ENCOUNTER — Ambulatory Visit (INDEPENDENT_AMBULATORY_CARE_PROVIDER_SITE_OTHER): Payer: Medicare Other | Admitting: Family Medicine

## 2016-03-26 VITALS — BP 142/82 | HR 62 | Temp 97.9°F | Ht 63.0 in | Wt 217.4 lb

## 2016-03-26 DIAGNOSIS — I10 Essential (primary) hypertension: Secondary | ICD-10-CM

## 2016-03-26 DIAGNOSIS — Z6838 Body mass index (BMI) 38.0-38.9, adult: Secondary | ICD-10-CM | POA: Diagnosis not present

## 2016-03-26 DIAGNOSIS — I255 Ischemic cardiomyopathy: Secondary | ICD-10-CM

## 2016-03-26 DIAGNOSIS — R600 Localized edema: Secondary | ICD-10-CM

## 2016-03-26 DIAGNOSIS — M25562 Pain in left knee: Secondary | ICD-10-CM | POA: Diagnosis not present

## 2016-03-26 DIAGNOSIS — R635 Abnormal weight gain: Secondary | ICD-10-CM

## 2016-03-26 NOTE — Patient Instructions (Addendum)
A few things to remember from today's visit:   BMI 38.0-38.9,adult  Essential hypertension  Leg edema, right  Knee pain, left ? Osteoarthritis. Tylenol 650 mg 3 times daly and tai Chi.   Reconsider colonoscopy.  Mammogram will be arranged.  What are some tips for weight loss? People become overweight for many reasons. Weight issues can run in families. They can be caused by unhealthy behaviors and a person's environment. Certain health problems and medicines can also lead to weight gain. There are some simple things you can do to reach and maintain a healthy weight:  Eat 500 fewer calories per day than your body needs to maintain your weight. Women should aim for no more than 1,200 to 1,500 calories per day. Men should aim for 1,500 to 1,800 calories per day. Avoid sweet drinks. These include regular soft drinks, fruit juices, fruit drinks, energy drinks, sweetened iced tea, and flavored milk. Avoid fast foods. Fast foods such as french fries, hamburgers, chicken nuggets, and pizza are high in calories and can cause weight gain. Eat a healthy breakfast. People who skip breakfast tend to weigh more. Don't watch more than two hours of television per day. Chew sugar-free gum between meals to cut down on snacking. Avoid grocery shopping when you're hungry. Pack a healthy lunch instead of eating out to control what and how much you eat. Eat a lot of fruits and vegetables. Aim for about 2 cups of fruit and 2 to 3 cups of vegetables per day. Aim for 150 minutes per week of moderate-intensity exercise (such as brisk walking), or 75 minutes per week of vigorous exercise (such as jogging or running). Be more active. Small changes in physical activity can easily be added to your daily routine. For example, take the stairs instead of the elevator. Take a walk with your family. A daily walk is a great way to get exercise and to catch up on the day's events.   Vein disease is a condition that  can affect the veins in the legs. It can cause leg pain, varicose veins, swollen legs, or open sores. Varicose veins are swollen and twisted veins. Things that may help: leg exercises (ankle flexion, walking),compression stocking. Compression stockings- [facility] in [place]   Please be sure medication list is accurate. If a new problem present, please set up appointment sooner than planned today.

## 2016-03-26 NOTE — Progress Notes (Signed)
HPI:   [name] is a 70 y.o. female, who is here today to establish care with me.  Former PCP: None.  Last preventive routine visit: has not had one.   Concerns today: wt gain and knee pain.  She reports a wt gain of 10 pounds in 10 weeks.She weighed on her mother's scale 4 days ago and today she is 10 poundds heavier. She also feel like her clothes fit tighter. She wonders if it is fluid accumulation. She states that she is a "picky eater"  One meal daily or 1-2 meals daily. Snacks on crackers , cookies, and drinks regular sodas.  She has not eating today.  Yesterday she ate bake spagetti and salad with dressing, small portions. water x 4 battle, caffeine free Pepsy 16 oz, 2 chocolate chips cookies.  Right knee pain: She has had R knee pain intermittently for about 11 years, started again about 1-2 weeks ago, no Hx of recent trauma, mild edema on medial aspect. Pain is soreness/achy, 5/10, exacerbated by full extension, prolonged standing/walking, and activities as squatting and bending.  Other joints sometimes achy, mild. She has not taken OTC medication. Problem is getting better.   Hypertension: Currently she is on Carvedilol and Lisinopril. She is taking medications as instructed, no side effects reported.  She has not noted unusual headache, visual changes, exertional chest pain, dyspnea,  Or focal weakness.  Feeling LE "tightness" for the past few days, better in the morning and worse with prolonged standing/walking. No recent long travel, no on HT, and no recent surgeries.     Lab Results  Component Value Date   CREATININE 0.77 07/16/2015   BUN 12 07/16/2015   NA 139 07/16/2015   K 4.2 07/16/2015   CL 108 07/16/2015   CO2 24 07/16/2015   Lab Results  Component Value Date   WBC 8.8 06/03/2015   HGB 13.9 06/03/2015   HCT 41.0 06/03/2015   MCV 91.1 06/03/2015   PLT 210 06/03/2015     Hyperlipidemia: She  is on Lipitor 80 mg, if  she takes it daily it causes leg aching. Trying to follow a low fat diet. She has not noted side effects with medication taking Lipitor q 3 days.    Lab Results  Component Value Date   CHOL 128 07/16/2015   HDL 54 07/16/2015   LDLCALC 54 07/16/2015   TRIG 101 07/16/2015   CHOLHDL 2.4 07/16/2015    Last FLP 10/2015.    Review of Systems  Constitutional: Negative for activity change, appetite change, fatigue, fever and unexpected weight change.  HENT: Negative for mouth sores, nosebleeds and trouble swallowing.   Eyes: Negative for redness and visual disturbance.  Respiratory: Negative for cough, shortness of breath and wheezing.   Cardiovascular: Negative for chest pain, palpitations and leg swelling.       No orthopnea or PND.  Gastrointestinal: Negative for abdominal pain, nausea and vomiting.       Negative for changes in bowel habits.  Genitourinary: Negative for decreased urine volume, difficulty urinating, dysuria and hematuria.  Musculoskeletal: Positive for arthralgias and joint swelling. Negative for gait problem and myalgias.  Neurological: Negative for seizures, syncope, weakness, numbness and headaches.  Psychiatric/Behavioral: Negative for confusion and sleep disturbance. The patient is not nervous/anxious.        Current Outpatient Prescriptions on File Prior to Visit  Medication Sig Dispense Refill  . acetaminophen (TYLENOL) 325 MG tablet Take 650 mg by mouth  every 6 (six) hours as needed for headache.    Marland Kitchen aspirin EC 81 MG EC tablet Take 1 tablet (81 mg total) by mouth daily.    Marland Kitchen atorvastatin (LIPITOR) 80 MG tablet Take 1 tablet (80 mg total) by mouth daily at 6 PM. (Patient taking differently: Take 80 mg by mouth daily at 6 PM. Patient is taking every 3rd day.) 90 tablet 3  . b complex vitamins tablet Take 1 tablet by mouth every other day.    . carvedilol (COREG) 6.25 MG tablet Take 1 tablet (6.25 mg total) by mouth 2 (two) times daily with a meal. 60  tablet 6  . clopidogrel (PLAVIX) 75 MG tablet Take 1 tablet (75 mg total) by mouth daily. 30 tablet 11  . fluticasone (FLONASE) 50 MCG/ACT nasal spray Place 2 sprays into both nostrils as needed for allergies or rhinitis.    . hydroxypropyl methylcellulose / hypromellose (ISOPTO TEARS / GONIOVISC) 2.5 % ophthalmic solution Place 1 drop into both eyes as needed for dry eyes.    Marland Kitchen lisinopril (PRINIVIL,ZESTRIL) 10 MG tablet Take 1 tablet (10 mg total) by mouth 2 (two) times daily. 180 tablet 3  . olopatadine (PATANOL) 0.1 % ophthalmic solution Place 1 drop into both eyes daily as needed for allergies.    . nitroGLYCERIN (NITROSTAT) 0.4 MG SL tablet Place 1 tablet (0.4 mg total) under the tongue every 5 (five) minutes x 3 doses as needed for chest pain. (Patient not taking: Reported on 03/26/2016) 25 tablet 3   No current facility-administered medications on file prior to visit.      Past Medical History:  Diagnosis Date  . Allergy   . CAD (coronary artery disease)    cath 06/02/2015 25% mid RCA, 25% prox LCx, 100% prox LAD treated with DES (3.0 x 15 mm Xience), EF 40%  . Chicken pox   . Former smoker   . Heart disease   . Hyperlipidemia   . Hypertension   . Mild ichemic cardiomyopathy, EF 45-50% post cath 06/2015 06/04/2015  . NSTEMI (non-ST elevated myocardial infarction) ([place]) 06/02/2015   No Known Allergies  Family History  Problem Relation Age of Onset  . Heart attack Father   . Valvular heart disease Mother     Social History   Social History  . Marital status: Legally Separated    Spouse name: N/A  . Number of children: N/A  . Years of education: N/A   Social History Main Topics  . Smoking status: Former Research scientist (life sciences)  . Smokeless tobacco: None     Comment: quit when she was 65s  . Alcohol use No  . Drug use: No  . Sexual activity: Yes    Birth control/ protection: None   Other Topics Concern  . None   Social History Narrative  . None    Vitals:   03/26/16 1255  BP:  (!) 142/82  Pulse: 62  Temp: 97.9 F (36.6 C)    Body mass index is 38.51 kg/m.   O2 sat at RA 98%.   Physical Exam  Constitutional: She is oriented to person, place, and time. She appears well-developed. No distress.  HENT:  Head: Atraumatic.  Mouth/Throat: Oropharynx is clear and moist and mucous membranes are normal.  Eyes: Conjunctivae and EOM are normal. Pupils are equal, round, and reactive to light.  Neck: No thyroid mass and no thyromegaly present.  Cardiovascular: Normal rate and regular rhythm.   No murmur heard. Respiratory: Effort normal and breath sounds  normal. No respiratory distress.  GI: Soft. She exhibits no mass. There is no tenderness.  Musculoskeletal: She exhibits edema (Trace pitting edema bilateral LE.).  Left knee: pain upon palpation of medial interarticular line, mild effusion, pain also elicited by full extension. Normal ROM, stable knee, no erythema. Right calf noted to be about 2-3 cm bigger in diameter. No erythema or pain.   Lymphadenopathy:    She has no cervical adenopathy.  Neurological: She is alert and oriented to person, place, and time. She has normal strength. Coordination normal.  Skin: Skin is warm. No rash noted. No erythema.  Psychiatric: She has a normal mood and affect.  Well groomed, good eye contact.      ASSESSMENT AND PLAN:     Mayola was seen today for new patient (initial visit).  Diagnoses and all orders for this visit:  Knee pain, left  Most likely OA. OTC Tylenol 650 mg tid and topical Icy Hot may help. It seems improving and since there is no recent Hx of trauma, I do not think imaging is needed.   BMI 38.0-38.9,adult -     TSH -     Basic metabolic panel  Essential hypertension  Mildly above goal, < 140/90. No changes in current management. DASH diet recommended. Monitor BP at home. Eye exam recommended annually. F/U in 3 months, before if needed.  -     Basic metabolic panel  Leg edema,  right  She is not sure if right calf is bigger than usual, pitting edema seems similar on both legs, no signs of DVT but still I recommended venous U/S. She refused imaging, clearly instructed about warning signs. LE elevation a few times per day.   Weight gain  Dietary recommendations given. Avoiding skipping meals, small meals during the day instead 3 big meals. Healthy snacks. Brisk daily walking 15-30 min as tolerated.  Use same scale and weigh at same time daily, ideally first thing in the morning.  -     TSH    -I tried to place mammogram but not able to sign order because it does not seem to be covered by her insurance. Will discuss next OV. -She refused colonoscopy. -She needs pap smear, recommend arranging a physical.       Demari Gales G. Martinique, MD  [facility]. [place] office.

## 2016-03-26 NOTE — Progress Notes (Signed)
Pre visit review using our clinic review tool, if applicable. No additional management support is needed unless otherwise documented below in the visit note. 

## 2016-05-11 ENCOUNTER — Encounter: Payer: Self-pay | Admitting: Cardiovascular Disease

## 2016-05-11 NOTE — Telephone Encounter (Signed)
This encounter was created in error - please disregard.

## 2016-05-11 NOTE — Telephone Encounter (Signed)
New message  Pt call requesting to speak with RN. Pt wants to know if she needs to come in for a f/u appt, please call back to discuss

## 2016-05-12 ENCOUNTER — Encounter (INDEPENDENT_AMBULATORY_CARE_PROVIDER_SITE_OTHER): Payer: Self-pay

## 2016-05-12 ENCOUNTER — Ambulatory Visit (INDEPENDENT_AMBULATORY_CARE_PROVIDER_SITE_OTHER): Payer: Medicare Other | Admitting: Cardiovascular Disease

## 2016-05-12 ENCOUNTER — Encounter: Payer: Self-pay | Admitting: Cardiovascular Disease

## 2016-05-12 VITALS — BP 160/102 | HR 62 | Ht 63.0 in | Wt 218.8 lb

## 2016-05-12 DIAGNOSIS — I2510 Atherosclerotic heart disease of native coronary artery without angina pectoris: Secondary | ICD-10-CM

## 2016-05-12 DIAGNOSIS — I10 Essential (primary) hypertension: Secondary | ICD-10-CM | POA: Diagnosis not present

## 2016-05-12 DIAGNOSIS — I255 Ischemic cardiomyopathy: Secondary | ICD-10-CM

## 2016-05-12 MED ORDER — HYDROCHLOROTHIAZIDE 25 MG PO TABS: 25.0000 mg | ORAL_TABLET | Freq: Every day | ORAL | 3 refills | Status: DC

## 2016-05-12 NOTE — Patient Instructions (Signed)
Medication Instructions:  Your physician has recommended you make the following change in your medication:  1. START HCTZ 25mg  take one tablet by mouth daily  Labwork: Your physician recommends that you return for lab work in: 2 WEEKS (BMP), lab hours 7:30-5:00  Testing/Procedures: No new orders.   Follow-Up: Your physician recommends that you schedule a follow-up appointment in: 3-4 WEEKS with Richardson Dopp PA-C   Any Other Special Instructions Will Be Listed Below (If Applicable).     If you need a refill on your cardiac medications before your next appointment, please call your pharmacy.

## 2016-05-12 NOTE — Progress Notes (Signed)
Cardiology Office Note Date:  05/12/2016   ID:  Miranda Wong, DOB [birth year], MRN [id number]  PCP:  Miranda Martinique, MD  Cardiologist:  Sherren Mocha, MD    Chief Complaint  Patient presents with  . Coronary Artery Disease    no sx   History of Present Illness: Miranda Wong is a 70 y.o. female who presents for follow-up of CAD. The patient presented with a NSTEMI in October 2016 and was found to have total occlusion of the proximal LAD treated with a Xience DES. Initially the patient's LV function was reduced but on follow-up echo this normalized with an LVEF of 65%.  Brething has improved since stopping Brilinta and switching to plavix. She had a recent URI but otherwise is doing well. No CP, orthopnea, or PND. She does complain of leg swelling. States BP runs 135-140/85 on average.   Past Medical History:  Diagnosis Date  . Allergy   . CAD (coronary artery disease)    cath 06/02/2015 25% mid RCA, 25% prox LCx, 100% prox LAD treated with DES (3.0 x 15 mm Xience), EF 40%  . Chicken pox   . Former smoker   . Heart disease   . Hyperlipidemia   . Hypertension   . Mild ichemic cardiomyopathy, EF 45-50% post cath 06/2015 06/04/2015  . NSTEMI (non-ST elevated myocardial infarction) ([place]) 06/02/2015    Past Surgical History:  Procedure Laterality Date  . CARDIAC CATHETERIZATION N/A 06/02/2015   Procedure: Left Heart Cath and Coronary Angiography;  Surgeon: Sherren Mocha, MD;  Location: [facility];  Service: Cardiovascular;  Laterality: N/A;  . CHOLECYSTECTOMY    . GALLBLADDER SURGERY    . TUBAL LIGATION      Current Outpatient Prescriptions  Medication Sig Dispense Refill  . acetaminophen (TYLENOL) 325 MG tablet Take 650 mg by mouth every 6 (six) hours as needed for headache.    Marland Kitchen aspirin EC 81 MG EC tablet Take 1 tablet (81 mg total) by mouth daily.    Marland Kitchen atorvastatin (LIPITOR) 80 MG tablet Take 80 mg by mouth every other day.  3  . b complex vitamins tablet Take 1  tablet by mouth every other day.    . carvedilol (COREG) 6.25 MG tablet Take 1 tablet (6.25 mg total) by mouth 2 (two) times daily with a meal. 60 tablet 6  . clopidogrel (PLAVIX) 75 MG tablet Take 1 tablet (75 mg total) by mouth daily. 30 tablet 11  . fluticasone (FLONASE) 50 MCG/ACT nasal spray Place 2 sprays into both nostrils as needed for allergies or rhinitis.    . hydroxypropyl methylcellulose / hypromellose (ISOPTO TEARS / GONIOVISC) 2.5 % ophthalmic solution Place 1 drop into both eyes as needed for dry eyes.    Marland Kitchen lisinopril (PRINIVIL,ZESTRIL) 10 MG tablet Take 1 tablet (10 mg total) by mouth 2 (two) times daily. 180 tablet 3  . nitroGLYCERIN (NITROSTAT) 0.4 MG SL tablet Place 1 tablet (0.4 mg total) under the tongue every 5 (five) minutes x 3 doses as needed for chest pain. 25 tablet 3   No current facility-administered medications for this visit.     Allergies:   Review of patient's allergies indicates no known allergies.   Social History:  The patient  reports that she has quit smoking. She has never used smokeless tobacco. She reports that she does not drink alcohol or use drugs.   Family History:  The patient's family history includes Heart attack in her father; Valvular heart disease  in her mother.    ROS:  Please see the history of present illness.  Otherwise, review of systems is positive for weight gain (11#), cough, easy bruising.  All other systems are reviewed and negative.    PHYSICAL EXAM: VS:  BP (!) 160/102 (BP Location: Right Leg, Patient Position: Sitting, Cuff Size: Large)   Pulse 62   Ht 5\' 3"  (1.6 m)   Wt 99.2 kg (218 lb 12.8 oz)   SpO2 96%   BMI 38.76 kg/m  , BMI Body mass index is 38.76 kg/m. GEN: Well nourished, well developed, pleasant obese woman in no acute distress  HEENT: normal  Neck: no JVD, no masses. No carotid bruits Cardiac: RRR without murmur or gallop                Respiratory:  clear to auscultation bilaterally, normal work of  breathing GI: soft, nontender, nondistended, + BS MS: no deformity or atrophy  Ext: trace bilateral pretibial edema, pedal pulses 2+= bilaterally Skin: warm and dry, no rash Neuro:  Strength and sensation are intact Psych: euthymic mood, full affect  EKG:  EKG is ordered today. The ekg ordered today shows NSR 60 bpm, left axis deviation, age-indeterminate septal infarct  Recent Labs: 06/02/2015: B Natriuretic Peptide 371.7 06/03/2015: Hemoglobin 13.9; Platelets 210 07/16/2015: BUN 12; Creat 0.77; Potassium 4.2; Sodium 139 10/10/2015: ALT 29   Lipid Panel     Component Value Date/Time   CHOL 128 07/16/2015 0945   TRIG 101 07/16/2015 0945   HDL 54 07/16/2015 0945   CHOLHDL 2.4 07/16/2015 0945   VLDL 20 07/16/2015 0945   LDLCALC 54 07/16/2015 0945      Wt Readings from Last 3 Encounters:  05/12/16 99.2 kg (218 lb 12.8 oz)  03/26/16 98.6 kg (217 lb 6 oz)  10/14/15 93 kg (205 lb)    Cardiac Studies Reviewed: 2D Echo 10/10/2015: Study Conclusions  - Left ventricle: The cavity size was normal. Systolic function was   vigorous. The estimated ejection fraction was in the range of 65%   to 70%. Wall motion was normal; there were no regional wall   motion abnormalities. Doppler parameters are consistent with   abnormal left ventricular relaxation (grade 1 diastolic   dysfunction). There was no evidence of elevated ventricular   filling pressure by Doppler parameters. - Aortic valve: Trileaflet; normal thickness leaflets. There was no   regurgitation. - Aortic root: The aortic root was normal in size. - Mitral valve: Structurally normal valve. There was no   regurgitation. - Right ventricle: The cavity size was normal. Wall thickness was   normal. Systolic function was normal. - Tricuspid valve: There was mild regurgitation. - Pulmonary arteries: Systolic pressure was mildly increased. PA   peak pressure: 38 mm Hg (S). - Inferior vena cava: The vessel was normal in size. -  Pericardium, extracardiac: There was no pericardial effusion.  Cath 06-02-2015: Conclusion    Mid RCA lesion, 25% stenosed.  Prox Cx lesion, 25% stenosed.  Prox LAD lesion, 100% stenosed. There is a 0% residual stenosis post intervention.  A drug-eluting stent was placed.  There is moderate left ventricular systolic dysfunction.   1. Total occlusion of the mid-LAD at the first diagonal origin, treated successfully with PCI (DES platform) 2. Mild nonobstructive LCx and RCA stenosis 3. Moderate segmental LV systolic dysfunction with LVEF estimated at 40%  Recommendations: Aggrastat x 6 hours. Pt given brilinta 180 mg in the cath lab but vomited shortly after administration. Will  re-dose Brilinta in 2 hours.   ASSESSMENT AND PLAN: 1.  CAD, native vessel, without angina: current medical therapy reviewed and will be continued. Would be reasonable to discontinue plavix after 12 months of Rx and this could be done when she returns for clinical FU.  2. HTN, uncontrolled: discussed lifestyle modification and pharmacologic therapy with the patient. Weight loss strategies reviewed. Will start HCTZ 25 mg daily. Continue coreg 6.25 mg BID and lisinopril 10 mg BID. Repeat labs in 2 weeks. FU with Scott 3-4 weeks.  3. Hyperlipidemia: continue atorvastatin. Last lipids reviewed. Most recent LFT's reviewed from 10/2015.  Current medicines are reviewed with the patient today.  The patient does not have concerns regarding medicines.  Labs/ tests ordered today include:  Orders Placed This Encounter  Procedures  . EKG 12-Lead    Disposition:   FU as above  Signed, Sherren Mocha, MD  05/12/2016 2:54 PM    [facility] [address], [place], [place]  [address] Phone: [phone]; Fax: [phone]

## 2016-05-20 ENCOUNTER — Encounter: Payer: Self-pay | Admitting: Physician Assistant

## 2016-05-22 ENCOUNTER — Encounter: Payer: Self-pay | Admitting: *Deleted

## 2016-05-26 ENCOUNTER — Encounter: Payer: Self-pay | Admitting: Physician Assistant

## 2016-05-26 ENCOUNTER — Other Ambulatory Visit: Payer: Medicare Other | Admitting: *Deleted

## 2016-05-26 DIAGNOSIS — I10 Essential (primary) hypertension: Secondary | ICD-10-CM

## 2016-05-26 DIAGNOSIS — I2510 Atherosclerotic heart disease of native coronary artery without angina pectoris: Secondary | ICD-10-CM | POA: Diagnosis not present

## 2016-05-26 LAB — BASIC METABOLIC PANEL
BUN: 15 mg/dL (ref 7–25)
CALCIUM: 9.5 mg/dL (ref 8.6–10.4)
CO2: 27 mmol/L (ref 20–31)
CREATININE: 1.05 mg/dL — AB (ref 0.50–0.99)
Chloride: 101 mmol/L (ref 98–110)
GLUCOSE: 126 mg/dL — AB (ref 65–99)
Potassium: 4.3 mmol/L (ref 3.5–5.3)
Sodium: 136 mmol/L (ref 135–146)

## 2016-05-28 ENCOUNTER — Telehealth: Payer: Self-pay | Admitting: Cardiovascular Disease

## 2016-05-28 DIAGNOSIS — I2510 Atherosclerotic heart disease of native coronary artery without angina pectoris: Secondary | ICD-10-CM

## 2016-05-28 DIAGNOSIS — I10 Essential (primary) hypertension: Secondary | ICD-10-CM

## 2016-05-28 MED ORDER — HYDROCHLOROTHIAZIDE 25 MG PO TABS: 12.5000 mg | ORAL_TABLET | Freq: Every day | ORAL | 3 refills | Status: DC

## 2016-05-28 NOTE — Telephone Encounter (Signed)
Called pt. Review lab results and recommendations from Dr. Burt Knack. Pt needs to decrease HCTZ to 12.5 mg daily and repeat BMET in 6 weeks. Pt has appt in 2 weeks with Dr. Burt Knack. Informed pt to call our office with any questions or concerns, or if cramping does not improve. Pt was pleasant and verbalized understanding. Pt agreed with plan.

## 2016-05-28 NOTE — Telephone Encounter (Signed)
Follow Up:     Returning Lauren call from yesterday.

## 2016-06-10 ENCOUNTER — Ambulatory Visit (INDEPENDENT_AMBULATORY_CARE_PROVIDER_SITE_OTHER): Payer: Medicare Other | Admitting: Physician Assistant

## 2016-06-10 ENCOUNTER — Encounter: Payer: Self-pay | Admitting: Physician Assistant

## 2016-06-10 VITALS — BP 130/60 | HR 74 | Ht 63.0 in | Wt 218.4 lb

## 2016-06-10 DIAGNOSIS — I10 Essential (primary) hypertension: Secondary | ICD-10-CM

## 2016-06-10 DIAGNOSIS — E7800 Pure hypercholesterolemia, unspecified: Secondary | ICD-10-CM | POA: Diagnosis not present

## 2016-06-10 DIAGNOSIS — I2510 Atherosclerotic heart disease of native coronary artery without angina pectoris: Secondary | ICD-10-CM | POA: Diagnosis not present

## 2016-06-10 DIAGNOSIS — I255 Ischemic cardiomyopathy: Secondary | ICD-10-CM

## 2016-06-10 MED ORDER — CARVEDILOL 6.25 MG PO TABS: 6.2500 mg | ORAL_TABLET | Freq: Two times a day (BID) | ORAL | 3 refills | Status: DC

## 2016-06-10 MED ORDER — HYDROCHLOROTHIAZIDE 25 MG PO TABS: 12.5000 mg | ORAL_TABLET | Freq: Every day | ORAL | 3 refills | Status: DC

## 2016-06-10 NOTE — Progress Notes (Signed)
Cardiology Office Note:    Date:  06/10/2016   ID:  Miranda Wong, DOB Jun 06, 1946, MRN [id number]  PCP:  Betty Martinique, MD  Cardiologist:  Dr. Sherren Mocha   Electrophysiologist:  n/a  Referring MD: Martinique, Betty G, MD   Chief Complaint  Patient presents with  . Follow-up    HTN, CAD    History of Present Illness:    Miranda Wong is a 70 y.o. female with a hx of CAD s/p anterior non-STEMI in 10/16.  Urgent LHC demonstrated an occluded proximal LAD that was treated with a Xience DES. Ejection fraction was reduced and follow-up echo demonstrated EF 45-50%.  FU Echo in 2/17 demonstrated vigorous LVF with EF Q000111Q, mild diastolic dysfunction.  She was changed from Brilinta the Plavix secondary to dyspnea. Last seen by Dr. Burt Knack 05/12/16. Blood pressure was uncontrolled. Hydrochlorothiazide was added to her medical regimen. She returns for follow-up.  She did have to decrease her HCTZ to 12.5 mg daily secondary to leg cramps. Of note, potassium was normal. Her leg cramps are improved. She does not LE edema that worsens throughout the day. Edema is usually resolved in the mornings. She denies chest discomfort, significant dyspnea. She denies orthopnea, PND or syncope.  Prior CV studies that were reviewed today include:    Echo 10/10/15 Vigorous LVF, EF 65-70%, normal wall motion, grade 1 diastolic dysfunction, normal RVSF, mild TR, PASP 38 mmHg  Echo 06/03/15 Mild LVH, EF 45-50%, anteroseptal HK  LHC 06/02/15 LAD: Proximal 100% >> PCI: 3 x 15 mm Xience DES LCx: Proximal 25% RCA: Mid 25% EF 35-45%   Past Medical History:  Diagnosis Date  . Allergy   . CAD (coronary artery disease)    cath 06/02/2015 25% mid RCA, 25% prox LCx, 100% prox LAD treated with DES (3.0 x 15 mm Xience), EF 40%  . Chicken pox   . Former smoker   . Heart disease   . Hyperlipidemia   . Hypertension   . Mild ichemic cardiomyopathy, EF 45-50% post cath 06/2015 06/04/2015  . NSTEMI (non-ST elevated  myocardial infarction) ([place]) 06/02/2015    Past Surgical History:  Procedure Laterality Date  . CARDIAC CATHETERIZATION N/A 06/02/2015   Procedure: Left Heart Cath and Coronary Angiography;  Surgeon: Sherren Mocha, MD;  Location: [facility];  Service: Cardiovascular;  Laterality: N/A;  . CHOLECYSTECTOMY    . GALLBLADDER SURGERY    . TUBAL LIGATION      Current Medications: Current Meds  Medication Sig  . acetaminophen (TYLENOL) 325 MG tablet Take 650 mg by mouth every 6 (six) hours as needed for headache.  Marland Kitchen aspirin EC 81 MG EC tablet Take 1 tablet (81 mg total) by mouth daily.  Marland Kitchen atorvastatin (LIPITOR) 80 MG tablet Take 80 mg by mouth every other day.  . b complex vitamins tablet Take 1 tablet by mouth every other day.  . carvedilol (COREG) 6.25 MG tablet Take 1 tablet (6.25 mg total) by mouth 2 (two) times daily with a meal.  . fluticasone (FLONASE) 50 MCG/ACT nasal spray Place 2 sprays into both nostrils as needed for allergies or rhinitis.  . hydrochlorothiazide (HYDRODIURIL) 25 MG tablet Take 0.5 tablets (12.5 mg total) by mouth daily. Take a whole tablet (25 mg) on Mondays and Thursdays.  . hydroxypropyl methylcellulose / hypromellose (ISOPTO TEARS / GONIOVISC) 2.5 % ophthalmic solution Place 1 drop into both eyes as needed for dry eyes.  Marland Kitchen lisinopril (PRINIVIL,ZESTRIL) 10 MG tablet Take 1  tablet (10 mg total) by mouth 2 (two) times daily.  . nitroGLYCERIN (NITROSTAT) 0.4 MG SL tablet Place 1 tablet (0.4 mg total) under the tongue every 5 (five) minutes x 3 doses as needed for chest pain.  . [DISCONTINUED] carvedilol (COREG) 6.25 MG tablet Take 1 tablet (6.25 mg total) by mouth 2 (two) times daily with a meal.  . [DISCONTINUED] clopidogrel (PLAVIX) 75 MG tablet Take 1 tablet (75 mg total) by mouth daily.  . [DISCONTINUED] hydrochlorothiazide (HYDRODIURIL) 25 MG tablet Take 0.5 tablets (12.5 mg total) by mouth daily.     Allergies:   Review of patient's allergies indicates  no known allergies.   Social History   Social History  . Marital status: Widowed    Spouse name: N/A  . Number of children: N/A  . Years of education: N/A   Social History Main Topics  . Smoking status: Former Research scientist (life sciences)  . Smokeless tobacco: Never Used     Comment: quit when she was 59s  . Alcohol use No  . Drug use: No  . Sexual activity: Yes    Birth control/ protection: None   Other Topics Concern  . None   Social History Narrative  . None     Family History:  The patient's family history includes Heart attack in her father; Valvular heart disease in her mother.   ROS:   Please see the history of present illness.    ROS All other systems reviewed and are negative.   EKGs/Labs/Other Test Reviewed:    EKG:  EKG is  ordered today.  The ekg ordered today demonstrates NSR, HR 74, LAD, septal Q waves, QTc 439 ms, no change since 05/1216  Recent Labs: 10/10/2015: ALT 29 05/26/2016: BUN 15; Creat 1.05; Potassium 4.3; Sodium 136   Recent Lipid Panel    Component Value Date/Time   CHOL 128 07/16/2015 0945   TRIG 101 07/16/2015 0945   HDL 54 07/16/2015 0945   CHOLHDL 2.4 07/16/2015 0945   VLDL 20 07/16/2015 0945   LDLCALC 54 07/16/2015 0945     Physical Exam:    VS:  BP 130/60   Pulse 74   Ht 5\' 3"  (1.6 m)   Wt 218 lb 6.4 oz (99.1 kg)   BMI 38.69 kg/m     Wt Readings from Last 3 Encounters:  06/10/16 218 lb 6.4 oz (99.1 kg)  05/12/16 218 lb 12.8 oz (99.2 kg)  03/26/16 217 lb 6 oz (98.6 kg)     Physical Exam  Constitutional: She is oriented to person, place, and time. She appears well-developed and well-nourished. No distress.  HENT:  Head: Normocephalic and atraumatic.  Eyes: No scleral icterus.  Neck: No JVD present.  Cardiovascular: Normal rate, regular rhythm and normal heart sounds.   No murmur heard. Pulmonary/Chest: Effort normal. She has no wheezes. She has no rales.  Abdominal: Soft. There is no tenderness.  Musculoskeletal: She exhibits edema.    Trace bilateral LE edema  Neurological: She is alert and oriented to person, place, and time.  Skin: Skin is warm and dry.  Psychiatric: She has a normal mood and affect.    ASSESSMENT:    1. Coronary artery disease involving native coronary artery of native heart without angina pectoris   2. Ischemic cardiomyopathy   3. Pure hypercholesterolemia   4. Essential hypertension    PLAN:    In order of problems listed above:  1. CAD: S/p NSTEMI treated with DES to LAD in 10/16. She has  completed 1 year of dual antiplatelet therapy. Dr. Burt Knack felt that it would be reasonable for her to stop Plavix after 12 months of therapy. She is having no angina. She can finish her current bottle of Plavix, then discontinue. Continue aspirin, statin, beta blocker, ACE inhibitor.  2. Ischemic Cardiomyopathy: EF 45-50% at the time of MI.   Follow-up echocardiogram demonstrated EF 65-70%.  Continue beta-blocker, ACE inhibitor.     3. Hyperlipidemia: Continue statin. Arrange follow-up lipids and LFTs.  4. HTN:   Blood pressure improved. She is at goal now. However, she did have to decrease HCTZ to 12.5 mg daily secondary to cramps. She is bothered by LE edema which seems to be related to venous insufficiency. I recommended elevating her legs as well as wearing compression stockings. She can increase HCTZ to 25 mg twice a week with 12.5 mg all other days. She can increase this to 25 mg 3 times a week if needed. If she has recurrent cramps, consider changing HCTZ to Maxzide.   Medication Adjustments/Labs and Tests Ordered: Current medicines are reviewed at length with the patient today.  Concerns regarding medicines are outlined above.  Medication changes, Labs and Tests ordered today are outlined in the Patient Instructions noted below. Patient Instructions  Medication Instructions:  INCREASE Hydrochlorothiazide to 1 tablet (25 mg) on every Monday and Thursday.  Take 1/2 tablet all other days. If  your swelling is no better, you can increase to 1 tablet on every Monday, Wednesday, Friday. If your cramping returns, call.  After you finish your current bottle of Plavix, you can stop taking it.  Labwork: July 10, 2016: FASTING blood work. Please do not eat after midnight. You may come any time between 7:30 AM and 4:45 PM.   Testing/Procedures: None   Follow-Up: Dr. Sherren Mocha in 6 months.   Any Other Special Instructions Will Be Listed Below (If Applicable). Try to wear compression stockings and keep your legs elevated to help with the swelling.   If you need a refill on your cardiac medications before your next appointment, please call your pharmacy.   Signed, Richardson Dopp, PA-C  06/10/2016 5:24 PM    [facility] [address], [place], [place]  [address] Phone: [phone]; Fax: [phone]

## 2016-06-10 NOTE — Patient Instructions (Addendum)
Medication Instructions:  INCREASE Hydrochlorothiazide to 1 tablet (25 mg) on every Monday and Thursday.  Take 1/2 tablet all other days. If your swelling is no better, you can increase to 1 tablet on every Monday, Wednesday, Friday. If your cramping returns, call.  After you finish your current bottle of Plavix, you can stop taking it.  Labwork: July 10, 2016: FASTING blood work. Please do not eat after midnight. You may come any time between 7:30 AM and 4:45 PM.   Testing/Procedures: None   Follow-Up: Dr. Sherren Mocha in 6 months.   Any Other Special Instructions Will Be Listed Below (If Applicable). Try to wear compression stockings and keep your legs elevated to help with the swelling.   If you need a refill on your cardiac medications before your next appointment, please call your pharmacy.

## 2016-06-26 ENCOUNTER — Ambulatory Visit (INDEPENDENT_AMBULATORY_CARE_PROVIDER_SITE_OTHER): Payer: Medicare Other | Admitting: Family Medicine

## 2016-06-26 ENCOUNTER — Encounter: Payer: Self-pay | Admitting: Family Medicine

## 2016-06-26 VITALS — BP 132/80 | HR 64 | Resp 12 | Ht 63.0 in | Wt 219.2 lb

## 2016-06-26 DIAGNOSIS — Z2911 Encounter for prophylactic immunotherapy for respiratory syncytial virus (RSV): Secondary | ICD-10-CM

## 2016-06-26 DIAGNOSIS — I255 Ischemic cardiomyopathy: Secondary | ICD-10-CM

## 2016-06-26 DIAGNOSIS — E7800 Pure hypercholesterolemia, unspecified: Secondary | ICD-10-CM

## 2016-06-26 DIAGNOSIS — Z23 Encounter for immunization: Secondary | ICD-10-CM | POA: Diagnosis not present

## 2016-06-26 DIAGNOSIS — I10 Essential (primary) hypertension: Secondary | ICD-10-CM | POA: Diagnosis not present

## 2016-06-26 DIAGNOSIS — Z1239 Encounter for other screening for malignant neoplasm of breast: Secondary | ICD-10-CM

## 2016-06-26 DIAGNOSIS — I2510 Atherosclerotic heart disease of native coronary artery without angina pectoris: Secondary | ICD-10-CM

## 2016-06-26 DIAGNOSIS — Z1159 Encounter for screening for other viral diseases: Secondary | ICD-10-CM

## 2016-06-26 DIAGNOSIS — Z1231 Encounter for screening mammogram for malignant neoplasm of breast: Secondary | ICD-10-CM

## 2016-06-26 DIAGNOSIS — Z6838 Body mass index (BMI) 38.0-38.9, adult: Secondary | ICD-10-CM

## 2016-06-26 NOTE — Progress Notes (Signed)
Pre visit review using our clinic review tool, if applicable. No additional management support is needed unless otherwise documented below in the visit note. 

## 2016-06-26 NOTE — Progress Notes (Signed)
HPI:   Miranda Wong is a 70 y.o. female, who is here today to follow on HTN.  She followed with cardiologists recently, Dr Burt Knack, Hx of CAD and HLD. Echo in 10/10/15  LVEF Q000111Q, mild diastolic dysfunction   Currently on HCTZ 12.5 mg 5 days out of the week and 25 mg 2 times per week (recently changed by cardiologists), Carvedilol  6.25 mg bid, and Lisinopril 10 mg daily. Leg cramps resolved after changing HCTZ dose. She is taking medications as instructed, no side effects reported.  She has not noted unusual headache, visual changes, exertional chest pain, dyspnea,  focal weakness, or edema.   Lab Results  Component Value Date   CREATININE 1.05 (H) 05/26/2016   BUN 15 05/26/2016   NA 136 05/26/2016   K 4.3 05/26/2016   CL 101 05/26/2016   CO2 27 05/26/2016    Since her last OV, she has followed with cardiologists, Hx of CAD.  HLD, she is on Lipitor 80 mg daily. She is also following a low fat diet.  She has lab appt 07/09/16 for BMP and FLP and according to pt, she is supposed to follow q 6 months.  She is not exercising regularly and has not changed her diet since her last OV. She states that she eats when she feels like, today she has not eating anything. In general she eats 1-2 times per day, states that she does not snack in between meals either.   Concerns today: Mammogram and HCV screening.She also would like to have zoster vaccine.    Review of Systems  Constitutional: Negative for activity change, appetite change, fatigue, fever and unexpected weight change.  HENT: Negative for mouth sores, nosebleeds and trouble swallowing.   Eyes: Negative for pain, redness and visual disturbance.  Respiratory: Negative for cough, shortness of breath and wheezing.   Cardiovascular: Negative for chest pain, palpitations and leg swelling.  Gastrointestinal: Negative for abdominal pain, nausea and vomiting.       Negative for changes in bowel habits.    Genitourinary: Negative for decreased urine volume, difficulty urinating and hematuria.  Musculoskeletal: Negative for gait problem and myalgias.  Skin: Negative for color change and rash.  Neurological: Negative for syncope, weakness, numbness and headaches.  Psychiatric/Behavioral: Negative for confusion and sleep disturbance. The patient is not nervous/anxious.       Current Outpatient Prescriptions on File Prior to Visit  Medication Sig Dispense Refill  . acetaminophen (TYLENOL) 325 MG tablet Take 650 mg by mouth every 6 (six) hours as needed for headache.    Marland Kitchen aspirin EC 81 MG EC tablet Take 1 tablet (81 mg total) by mouth daily.    Marland Kitchen atorvastatin (LIPITOR) 80 MG tablet Take 80 mg by mouth every other day.    . b complex vitamins tablet Take 1 tablet by mouth every other day.    . carvedilol (COREG) 6.25 MG tablet Take 1 tablet (6.25 mg total) by mouth 2 (two) times daily with a meal. 180 tablet 3  . fluticasone (FLONASE) 50 MCG/ACT nasal spray Place 2 sprays into both nostrils as needed for allergies or rhinitis.    . hydrochlorothiazide (HYDRODIURIL) 25 MG tablet Take 0.5 tablets (12.5 mg total) by mouth daily. Take a whole tablet (25 mg) on Mondays and Thursdays. 45 tablet 3  . hydroxypropyl methylcellulose / hypromellose (ISOPTO TEARS / GONIOVISC) 2.5 % ophthalmic solution Place 1 drop into both eyes as needed for dry eyes.    Marland Kitchen  lisinopril (PRINIVIL,ZESTRIL) 10 MG tablet Take 1 tablet (10 mg total) by mouth 2 (two) times daily. 180 tablet 3  . nitroGLYCERIN (NITROSTAT) 0.4 MG SL tablet Place 1 tablet (0.4 mg total) under the tongue every 5 (five) minutes x 3 doses as needed for chest pain. 25 tablet 3   No current facility-administered medications on file prior to visit.      Past Medical History:  Diagnosis Date  . Allergy   . CAD (coronary artery disease)    cath 06/02/2015 25% mid RCA, 25% prox LCx, 100% prox LAD treated with DES (3.0 x 15 mm Xience), EF 40%  . Chicken pox    . Former smoker   . Heart disease   . Hyperlipidemia   . Hypertension   . Mild ichemic cardiomyopathy, EF 45-50% post cath 06/2015 06/04/2015  . NSTEMI (non-ST elevated myocardial infarction) ([place]) 06/02/2015   No Known Allergies  Social History   Social History  . Marital status: Widowed    Spouse name: N/A  . Number of children: N/A  . Years of education: N/A   Social History Main Topics  . Smoking status: Former Research scientist (life sciences)  . Smokeless tobacco: Never Used     Comment: quit when she was 68s  . Alcohol use No  . Drug use: No  . Sexual activity: Yes    Birth control/ protection: None   Other Topics Concern  . None   Social History Narrative  . None    Vitals:   06/26/16 1320  BP: 132/80  Pulse: 64  Resp: 12   Body mass index is 38.84 kg/m.   Wt Readings from Last 3 Encounters:  06/26/16 219 lb 4 oz (99.5 kg)  06/10/16 218 lb 6.4 oz (99.1 kg)  05/12/16 218 lb 12.8 oz (99.2 kg)     Physical Exam  Nursing note and vitals reviewed. Constitutional: She is oriented to person, place, and time. She appears well-developed. No distress.  HENT:  Head: Atraumatic.  Mouth/Throat: Oropharynx is clear and moist and mucous membranes are normal. She has dentures.  Eyes: Conjunctivae and EOM are normal. Pupils are equal, round, and reactive to light.  Neck: No JVD present. No thyroid mass and no thyromegaly present.  Cardiovascular: Normal rate and regular rhythm.   No murmur heard. Pulses:      Dorsalis pedis pulses are 2+ on the right side, and 2+ on the left side.  Respiratory: Effort normal and breath sounds normal. No respiratory distress.  GI: Soft. She exhibits no mass. There is no hepatomegaly. There is no tenderness.  Musculoskeletal: She exhibits no edema or tenderness.  Lymphadenopathy:    She has no cervical adenopathy.  Neurological: She is alert and oriented to person, place, and time. She has normal strength. Coordination normal.  Skin: Skin is warm. No  erythema.  Psychiatric: She has a normal mood and affect.  Well groomed, good eye contact.      ASSESSMENT AND PLAN:     Miranda Wong was seen today for follow-up.  Diagnoses and all orders for this visit:  BMI 38.0-38.9,adult  We discussed benefits of wt loss as well as adverse effects of obesity. Consistency with healthy diet and physical activity recommended. Daily brisk walking for 15-30 min as tolerated.  Essential hypertension  Adequately controlled. No changes in current management. Cr slightly elevated, has future lab appt to re-check BMP. DASH-low salt diet recommended. Eye exam periodically. She is following with DR Burt Knack in 11/2016, so it is appropriate  F/U with me in 12 months, before if needed.  Coronary artery disease involving native coronary artery of native heart without angina pectoris  Asymptomatic. Continue following with cardiologist, Dr Burt Knack.  Pure hypercholesterolemia  No changes in current management., Continue low fat diet. Has labs scheduled 07/2016. F/U in 12 months.  Encounter for hepatitis C screening test for low risk patient  Lab order given ,so it can be added to labs she already has scheduled.  -     Hepatitis C Antibody; Future  Breast cancer screening -     MM SCREENING BREAST TOMO BILATERAL; Future  Need for zoster vaccination -     Varicella-zoster vaccine subcutaneous       -Miranda Wong was advised to return sooner than planned today if new concerns arise.       Betty G. Martinique, MD  [facility]. [place] office.

## 2016-06-26 NOTE — Patient Instructions (Signed)
A few things to remember from today's visit:   Encounter for hepatitis C screening test for low risk patient - Plan: Hepatitis C Antibody  Essential hypertension  BMI 38.0-38.9,adult   [name], today we have followed on some of your chronic medical problems and they seem to be stable, so no changes in current management today.  Review medication list and be sure if is accurate.  -Remember a healthy diet and regular physical activity are very important for prevention as well as for well being; they also help with many chronic problems, decreasing the need of adding new medications and delaying or preventing possible complications.  Remember to arrange your follow up appt before leaving today. Please follow sooner than planned if a new concern arises.  Please be sure medication list is accurate. If a new problem present, please set up appointment sooner than planned today.

## 2016-06-29 NOTE — Telephone Encounter (Signed)
Please advise on the Hep C screening.

## 2016-07-10 ENCOUNTER — Encounter: Payer: Self-pay | Admitting: Physician Assistant

## 2016-07-10 ENCOUNTER — Other Ambulatory Visit: Payer: Medicare Other | Admitting: *Deleted

## 2016-07-10 DIAGNOSIS — I2510 Atherosclerotic heart disease of native coronary artery without angina pectoris: Secondary | ICD-10-CM | POA: Diagnosis not present

## 2016-07-10 DIAGNOSIS — Z1159 Encounter for screening for other viral diseases: Secondary | ICD-10-CM | POA: Diagnosis not present

## 2016-07-10 LAB — LIPID PANEL
CHOLESTEROL: 240 mg/dL — AB (ref <200)
HDL: 48 mg/dL — ABNORMAL LOW (ref >50)
LDL Cholesterol: 141 mg/dL — ABNORMAL HIGH (ref <100)
TRIGLYCERIDES: 254 mg/dL — AB (ref <150)
Total CHOL/HDL Ratio: 5 Ratio — ABNORMAL HIGH (ref <5.0)
VLDL: 51 mg/dL — AB (ref <30)

## 2016-07-10 LAB — HEPATIC FUNCTION PANEL
ALK PHOS: 72 U/L (ref 33–130)
ALT: 30 U/L — AB (ref 6–29)
AST: 33 U/L (ref 10–35)
Albumin: 3.9 g/dL (ref 3.6–5.1)
BILIRUBIN DIRECT: 0.1 mg/dL (ref <=0.2)
BILIRUBIN INDIRECT: 0.5 mg/dL (ref 0.2–1.2)
TOTAL PROTEIN: 6.7 g/dL (ref 6.1–8.1)
Total Bilirubin: 0.6 mg/dL (ref 0.2–1.2)

## 2016-07-13 ENCOUNTER — Telehealth: Payer: Self-pay | Admitting: *Deleted

## 2016-07-13 DIAGNOSIS — I2510 Atherosclerotic heart disease of native coronary artery without angina pectoris: Secondary | ICD-10-CM

## 2016-07-13 DIAGNOSIS — E7800 Pure hypercholesterolemia, unspecified: Secondary | ICD-10-CM

## 2016-07-13 MED ORDER — ROSUVASTATIN CALCIUM 40 MG PO TABS: 40.0000 mg | ORAL_TABLET | Freq: Every day | ORAL | 3 refills | Status: DC

## 2016-07-13 NOTE — Telephone Encounter (Signed)
Ptcb and has been notified of lab results. Pt states she had myalgias on the Lipitor 80 mg daily so she went to everyother day. I explained to the pt that taking the Lipitor that way was not enough to keep her numbers down. I compared numbers from last year to now for the pt. I advised she could resume the Lipitor at 80 mg daily. I gave the pt another option per Brynda Rim. PA that she could change over to Crestor 40 mg daily with repeat labs in 3 months. Pt has opted to go with the Crestor since she already knows she has myalgias on Lipitor 80 mg every day. Rx Crestor sent to [facility] LFT/FLP 10/12/16; pt asked to be put in the 11 am time slot. I did remind pt this is a fasting lab. Pt aware. Pt agreeable to plan of care.

## 2016-07-13 NOTE — Telephone Encounter (Signed)
Lmtcb to go over lab results and findings

## 2016-07-15 ENCOUNTER — Other Ambulatory Visit: Payer: Self-pay | Admitting: Physician Assistant

## 2016-07-15 DIAGNOSIS — I2510 Atherosclerotic heart disease of native coronary artery without angina pectoris: Secondary | ICD-10-CM

## 2016-10-12 ENCOUNTER — Other Ambulatory Visit: Payer: Medicare Other | Admitting: *Deleted

## 2016-10-12 DIAGNOSIS — I2510 Atherosclerotic heart disease of native coronary artery without angina pectoris: Secondary | ICD-10-CM | POA: Diagnosis not present

## 2016-10-12 DIAGNOSIS — E7800 Pure hypercholesterolemia, unspecified: Secondary | ICD-10-CM | POA: Diagnosis not present

## 2016-10-12 LAB — HEPATIC FUNCTION PANEL
ALT: 17 IU/L (ref 0–32)
AST: 23 IU/L (ref 0–40)
Albumin: 4.1 g/dL (ref 3.6–4.8)
Alkaline Phosphatase: 93 IU/L (ref 39–117)
Bilirubin Total: 0.6 mg/dL (ref 0.0–1.2)
Bilirubin, Direct: 0.18 mg/dL (ref 0.00–0.40)
Total Protein: 6.7 g/dL (ref 6.0–8.5)

## 2016-10-12 LAB — LIPID PANEL
CHOLESTEROL TOTAL: 129 mg/dL (ref 100–199)
Chol/HDL Ratio: 2.2 ratio units (ref 0.0–4.4)
HDL: 59 mg/dL (ref >39)
LDL CALC: 42 mg/dL (ref 0–99)
TRIGLYCERIDES: 140 mg/dL (ref 0–149)
VLDL CHOLESTEROL CAL: 28 mg/dL (ref 5–40)

## 2016-10-12 NOTE — Addendum Note (Signed)
Addended by: Eulis Foster on: 10/12/2016 12:37 PM   Modules accepted: Orders

## 2016-10-13 ENCOUNTER — Telehealth: Payer: Self-pay | Admitting: *Deleted

## 2016-10-13 DIAGNOSIS — E785 Hyperlipidemia, unspecified: Secondary | ICD-10-CM

## 2016-10-13 MED ORDER — ROSUVASTATIN CALCIUM 20 MG PO TABS: 20.0000 mg | ORAL_TABLET | Freq: Every day | ORAL | 3 refills | Status: DC

## 2016-10-13 NOTE — Telephone Encounter (Signed)
-----   Message from Liliane Shi, [place] sent at 10/13/2016 12:40 PM EST ----- We can try decreasing Crestor to 20 mg QD and repeating Lipids and LFTs in 2 months. If LDL > 70 at that time or if she is still having muscle pain, I would have her see the [facility] to help (may be a candidate for newer therapy like PCSK9 inhibitors which have lower side effect profile). Richardson Dopp, PA-C   10/13/2016 12:40 PM

## 2016-10-13 NOTE — Telephone Encounter (Signed)
Spoke with pt and reviewed recommendations per Richardson Dopp, PA-C.  Pt verbalized understanding and was in agreement with this plan.  Pt will plan to have labs drawn on 12/11/16 at the same time as her f/u appt with Dr. Burt Knack.  Pt appreciative for assistance.  Prescription sent to pharmacy.  Labs ordered and scheduled.  Pt aware to wait until after appt with Dr. Burt Knack to have labs drawn incase he decides to add additional labs.

## 2016-10-27 IMAGING — DX DG CHEST 2V
2 series · 2 of 2 positions shown · non-contrast
Comparison: None.

CLINICAL DATA: Shortness of breath and chest pain

EXAM:
CHEST  2 VIEW

[chest pa]
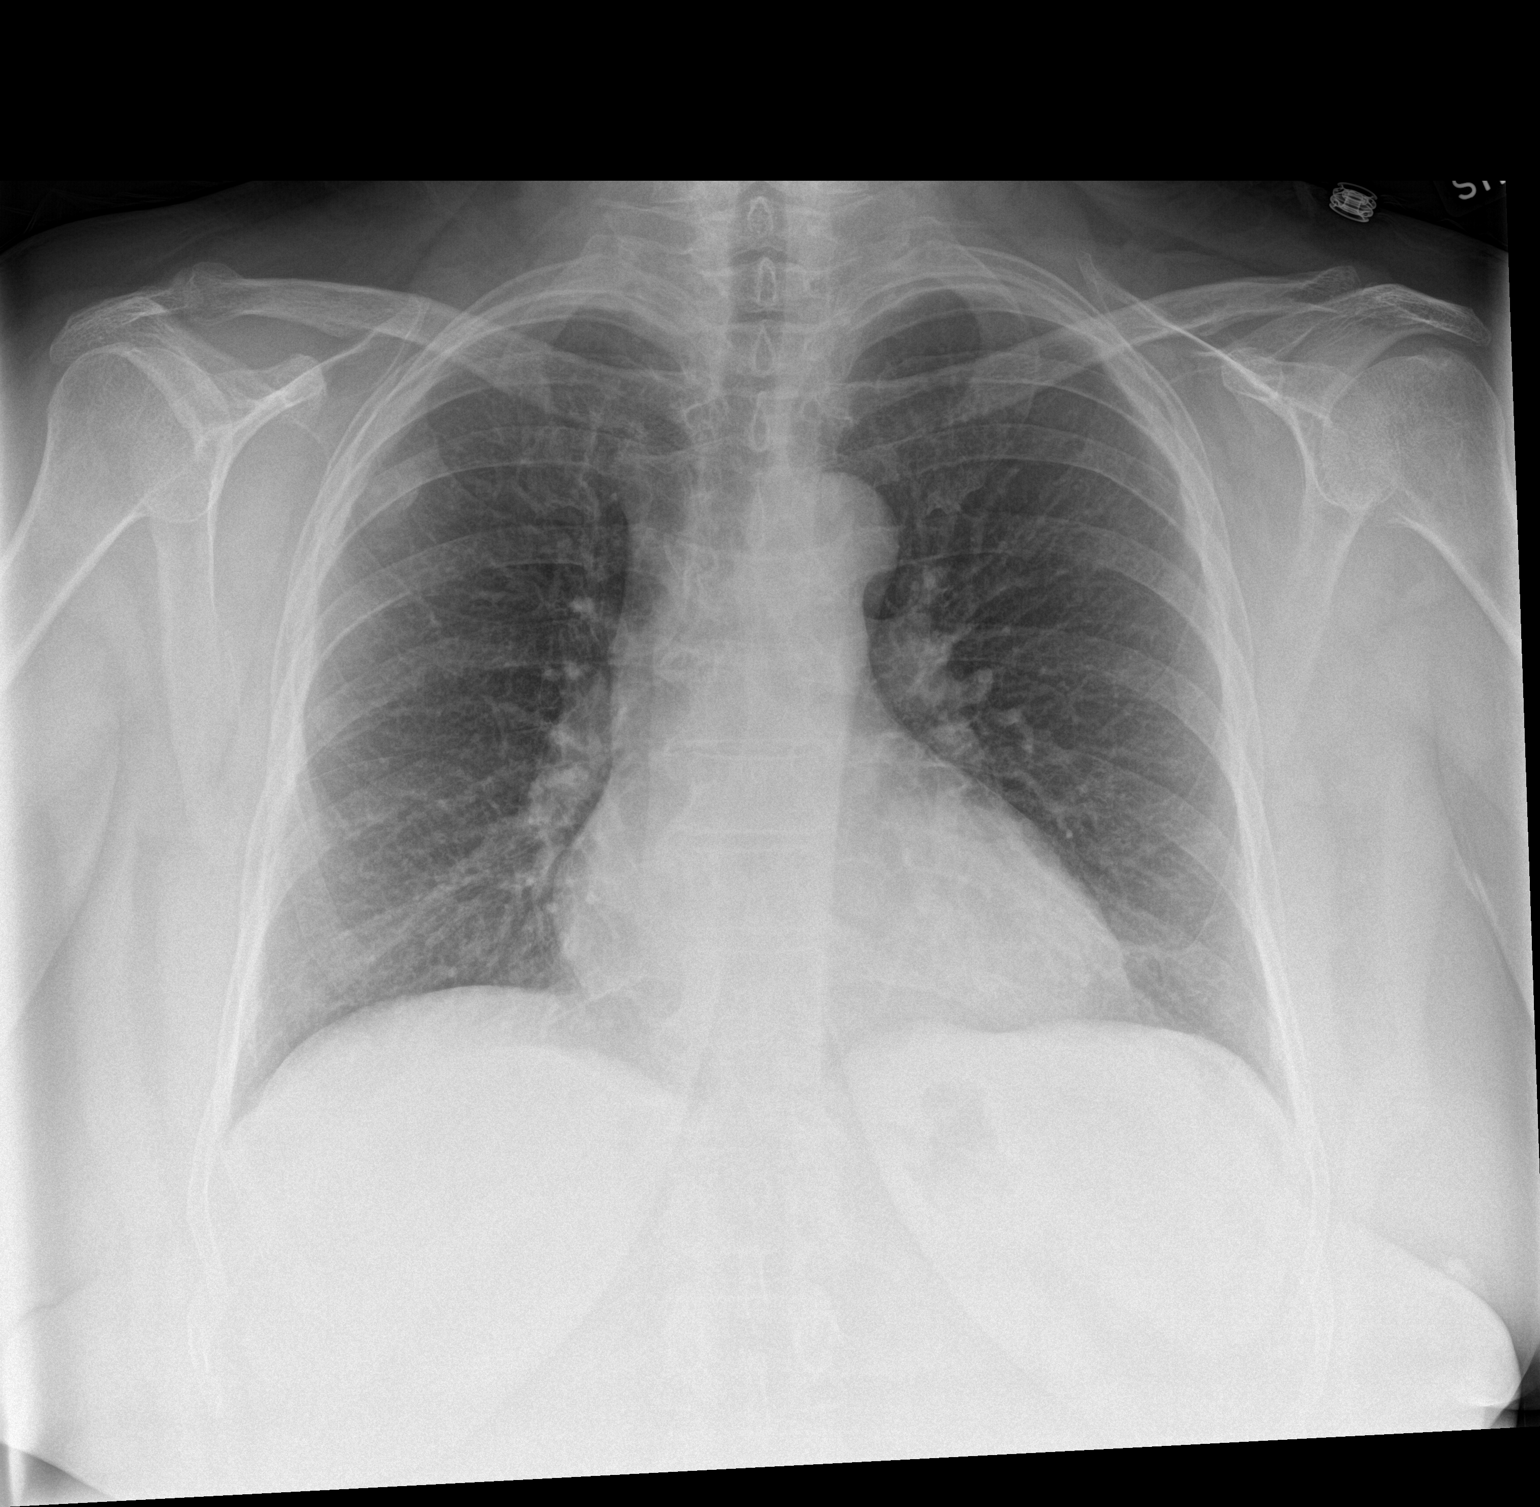

[chest lat]
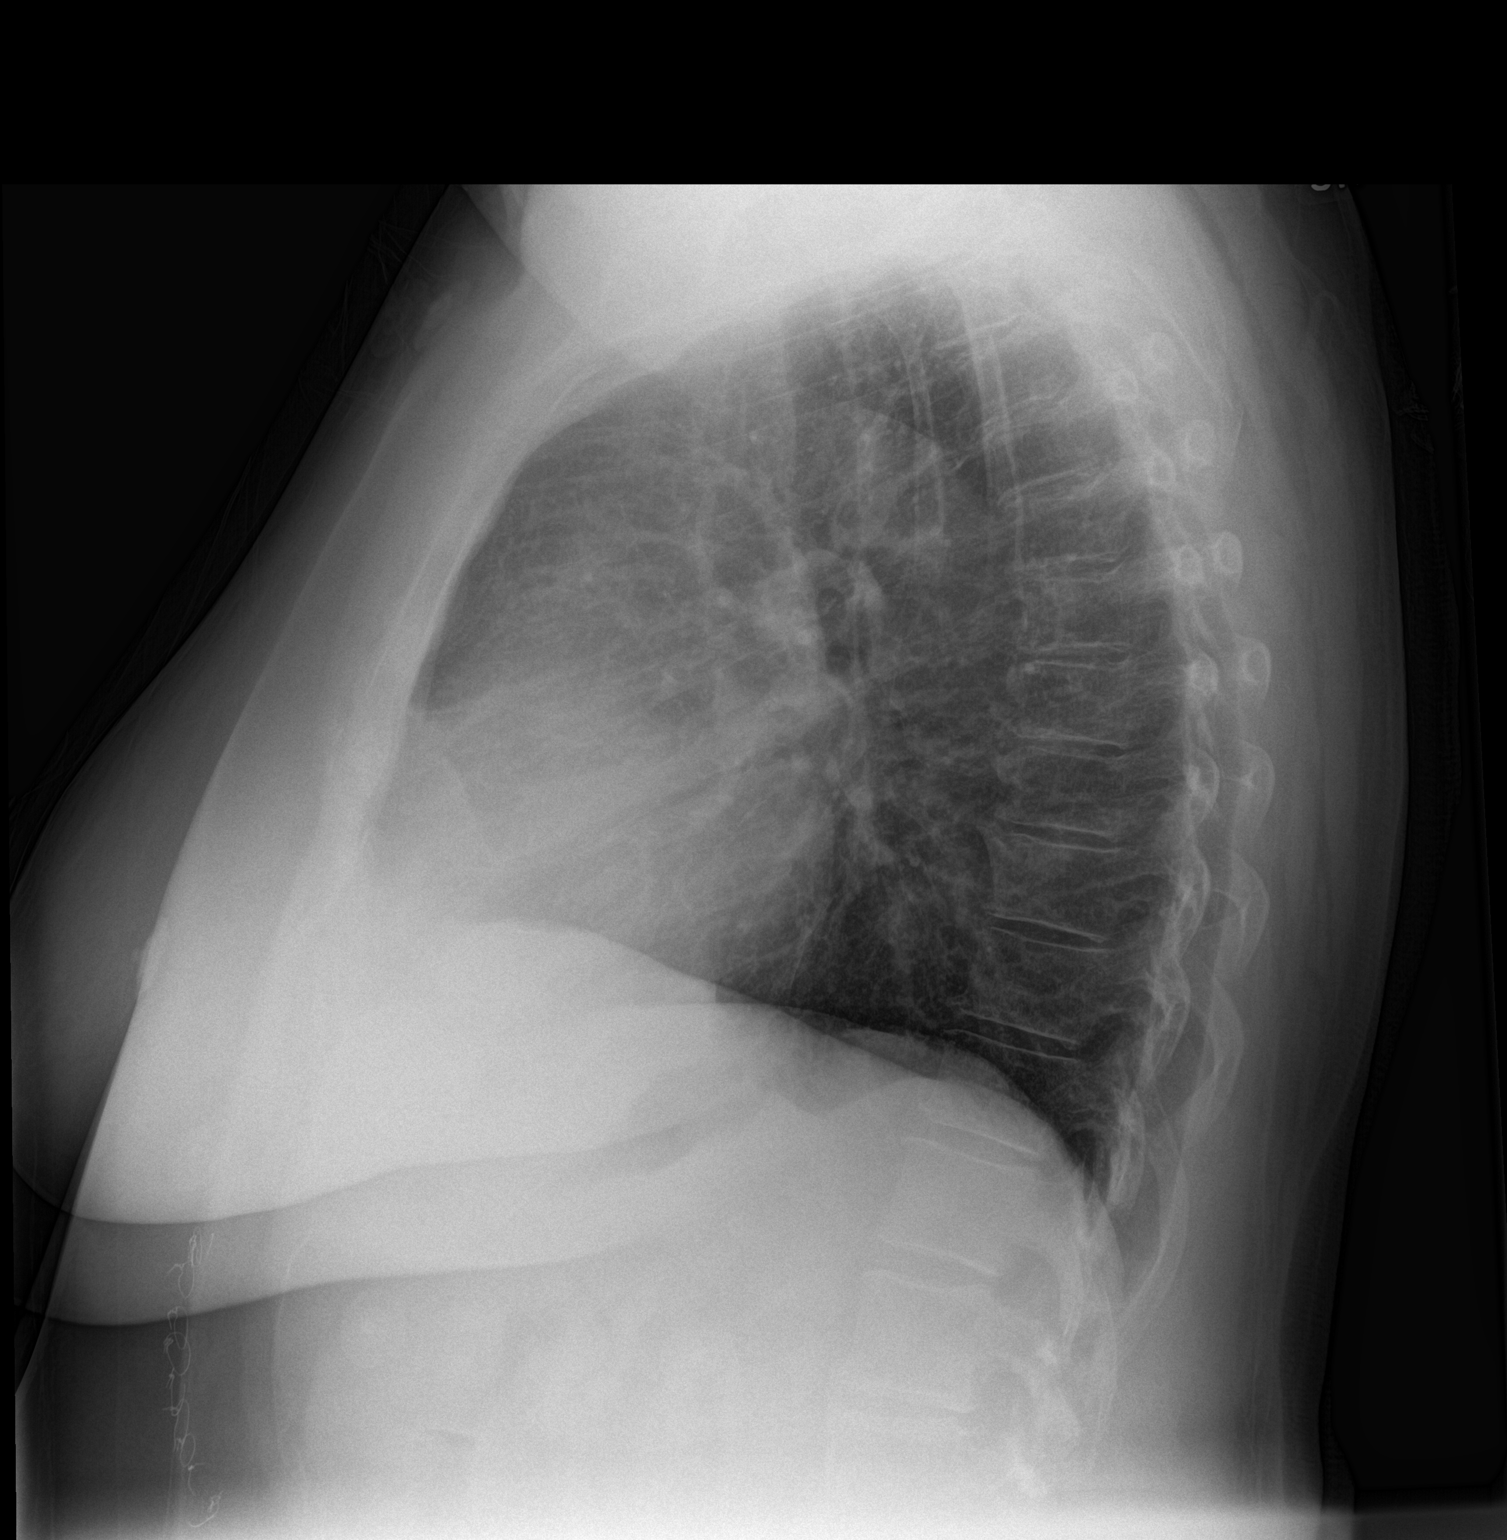

[2 of 2 positions shown; findings below may reference images not displayed]

FINDINGS: There is no edema or consolidation. The heart size and pulmonary
vascularity are normal. No adenopathy. No pneumothorax. No bone
lesions.
IMPRESSION: No edema or consolidation.

## 2016-10-29 ENCOUNTER — Other Ambulatory Visit: Payer: Self-pay | Admitting: Family Medicine

## 2016-11-26 ENCOUNTER — Encounter: Payer: Self-pay | Admitting: Cardiovascular Disease

## 2016-12-11 ENCOUNTER — Ambulatory Visit (INDEPENDENT_AMBULATORY_CARE_PROVIDER_SITE_OTHER): Payer: Medicare Other | Admitting: Cardiovascular Disease

## 2016-12-11 ENCOUNTER — Ambulatory Visit: Payer: Medicare Other | Admitting: Cardiovascular Disease

## 2016-12-11 ENCOUNTER — Other Ambulatory Visit: Payer: Medicare Other

## 2016-12-11 ENCOUNTER — Encounter: Payer: Self-pay | Admitting: Cardiovascular Disease

## 2016-12-11 VITALS — BP 126/84 | HR 72 | Ht 63.0 in | Wt 225.8 lb

## 2016-12-11 DIAGNOSIS — I255 Ischemic cardiomyopathy: Secondary | ICD-10-CM

## 2016-12-11 DIAGNOSIS — E7800 Pure hypercholesterolemia, unspecified: Secondary | ICD-10-CM | POA: Diagnosis not present

## 2016-12-11 DIAGNOSIS — I10 Essential (primary) hypertension: Secondary | ICD-10-CM | POA: Diagnosis not present

## 2016-12-11 DIAGNOSIS — I2589 Other forms of chronic ischemic heart disease: Secondary | ICD-10-CM | POA: Diagnosis not present

## 2016-12-11 DIAGNOSIS — E785 Hyperlipidemia, unspecified: Secondary | ICD-10-CM | POA: Diagnosis not present

## 2016-12-11 LAB — HEPATIC FUNCTION PANEL
ALBUMIN: 4.2 g/dL (ref 3.6–4.8)
ALT: 21 IU/L (ref 0–32)
AST: 29 IU/L (ref 0–40)
Alkaline Phosphatase: 91 IU/L (ref 39–117)
BILIRUBIN TOTAL: 0.6 mg/dL (ref 0.0–1.2)
BILIRUBIN, DIRECT: 0.18 mg/dL (ref 0.00–0.40)
TOTAL PROTEIN: 7 g/dL (ref 6.0–8.5)

## 2016-12-11 LAB — LIPID PANEL
CHOL/HDL RATIO: 2.5 ratio (ref 0.0–4.4)
Cholesterol, Total: 134 mg/dL (ref 100–199)
HDL: 53 mg/dL (ref >39)
LDL CALC: 51 mg/dL (ref 0–99)
Triglycerides: 151 mg/dL — ABNORMAL HIGH (ref 0–149)
VLDL CHOLESTEROL CAL: 30 mg/dL (ref 5–40)

## 2016-12-11 NOTE — Progress Notes (Signed)
Cardiology Office Note Date:  12/11/2016   ID:  Miranda Wong, DOB 1945-10-17, MRN [id number]  PCP:  Betty Martinique, MD  Cardiologist:  Sherren Mocha, MD    Chief Complaint  Patient presents with  . Coronary Artery Disease    follow up     History of Present Illness: Miranda Wong is a 71 y.o. female who presents for  follow-up of CAD. The patient presented with a NSTEMI in 2016 and was found to have total occlusion of the proximal LAD treated with a Xience DES. Initially the patient's LV function was reduced but on follow-up echo this normalized with an LVEF of 65%.  The patient is doing well. She experienced some leg cramps with higher doses of statin drugs but this is improved since she has reduced Crestor 20 mg. She denies chest pain or shortness of breath. She denies heart palpitations, orthopnea, or PND. She's compliant with her medications.     Past Medical History:  Diagnosis Date  . Allergy   . CAD (coronary artery disease)    cath 06/02/2015 25% mid RCA, 25% prox LCx, 100% prox LAD treated with DES (3.0 x 15 mm Xience), EF 40%  . Chicken pox   . Former smoker   . Heart disease   . Hyperlipidemia   . Hypertension   . Mild ichemic cardiomyopathy, EF 45-50% post cath 06/2015 06/04/2015  . NSTEMI (non-ST elevated myocardial infarction) ([place]) 06/02/2015    Past Surgical History:  Procedure Laterality Date  . CARDIAC CATHETERIZATION N/A 06/02/2015   Procedure: Left Heart Cath and Coronary Angiography;  Surgeon: Sherren Mocha, MD;  Location: [facility];  Service: Cardiovascular;  Laterality: N/A;  . CHOLECYSTECTOMY    . GALLBLADDER SURGERY    . TUBAL LIGATION      Current Outpatient Prescriptions  Medication Sig Dispense Refill  . acetaminophen (TYLENOL) 325 MG tablet Take 650 mg by mouth every 6 (six) hours as needed for headache.    Marland Kitchen aspirin EC 81 MG EC tablet Take 1 tablet (81 mg total) by mouth daily.    Marland Kitchen b complex vitamins tablet Take 1 tablet by  mouth every other day.    . carvedilol (COREG) 6.25 MG tablet Take 1 tablet (6.25 mg total) by mouth 2 (two) times daily with a meal. 180 tablet 3  . fluticasone (FLONASE) 50 MCG/ACT nasal spray Place 2 sprays into both nostrils as needed for allergies or rhinitis.    . hydroxypropyl methylcellulose / hypromellose (ISOPTO TEARS / GONIOVISC) 2.5 % ophthalmic solution Place 1 drop into both eyes as needed for dry eyes.    Marland Kitchen lisinopril (PRINIVIL,ZESTRIL) 10 MG tablet TAKE 1 TABLET BY MOUTH TWICE A DAY 180 tablet 3  . nitroGLYCERIN (NITROSTAT) 0.4 MG SL tablet Place 1 tablet (0.4 mg total) under the tongue every 5 (five) minutes x 3 doses as needed for chest pain. 25 tablet 3  . rosuvastatin (CRESTOR) 20 MG tablet Take 1 tablet (20 mg total) by mouth daily. 90 tablet 3  . hydrochlorothiazide (HYDRODIURIL) 25 MG tablet Take 0.5 tablets (12.5 mg total) by mouth daily. Take a whole tablet (25 mg) on Mondays and Thursdays. 45 tablet 3   No current facility-administered medications for this visit.     Allergies:   Patient has no known allergies.   Social History:  The patient  reports that she has quit smoking. She has never used smokeless tobacco. She reports that she does not drink alcohol or use  drugs.   Family History:  The patient's  family history includes Heart attack in her father; Valvular heart disease in her mother.    ROS:  Please see the history of present illness.  Otherwise, review of systems is positive for Leg cramping, back pain.  All other systems are reviewed and negative.    PHYSICAL EXAM: VS:  BP 126/84   Pulse 72   Ht 5\' 3"  (1.6 m)   Wt 225 lb 12.8 oz (102.4 kg)   BMI 40.00 kg/m  , BMI Body mass index is 40 kg/m. GEN: Well nourished, well developed, in no acute distress  HEENT: normal  Neck: no JVD, no masses. No carotid bruits Cardiac: RRR without murmur or gallop                Respiratory:  clear to auscultation bilaterally, normal work of breathing GI: soft,  nontender, nondistended, + BS MS: no deformity or atrophy  Ext: no pretibial edema, pedal pulses 2+= bilaterally Skin: warm and dry, no rash Neuro:  Strength and sensation are intact Psych: euthymic mood, full affect  EKG:  EKG is ordered today. The ekg ordered today shows normal sinus rhythm 72 bpm, left anterior fascicular block.  Recent Labs: 05/26/2016: BUN 15; Creat 1.05; Potassium 4.3; Sodium 136 10/12/2016: ALT 17   Lipid Panel     Component Value Date/Time   CHOL 129 10/12/2016 1238   TRIG 140 10/12/2016 1238   HDL 59 10/12/2016 1238   CHOLHDL 2.2 10/12/2016 1238   CHOLHDL 5.0 (H) 07/10/2016 1037   VLDL 51 (H) 07/10/2016 1037   LDLCALC 42 10/12/2016 1238      Wt Readings from Last 3 Encounters:  12/11/16 225 lb 12.8 oz (102.4 kg)  06/26/16 219 lb 4 oz (99.5 kg)  06/10/16 218 lb 6.4 oz (99.1 kg)     Cardiac Studies Reviewed: Echo 10/10/2015: Study Conclusions  - Left ventricle: The cavity size was normal. Systolic function was   vigorous. The estimated ejection fraction was in the range of 65%   to 70%. Wall motion was normal; there were no regional wall   motion abnormalities. Doppler parameters are consistent with   abnormal left ventricular relaxation (grade 1 diastolic   dysfunction). There was no evidence of elevated ventricular   filling pressure by Doppler parameters. - Aortic valve: Trileaflet; normal thickness leaflets. There was no   regurgitation. - Aortic root: The aortic root was normal in size. - Mitral valve: Structurally normal valve. There was no   regurgitation. - Right ventricle: The cavity size was normal. Wall thickness was   normal. Systolic function was normal. - Tricuspid valve: There was mild regurgitation. - Pulmonary arteries: Systolic pressure was mildly increased. PA   peak pressure: 38 mm Hg (S). - Inferior vena cava: The vessel was normal in size. - Pericardium, extracardiac: There was no pericardial effusion.   ASSESSMENT  AND PLAN: 1.  Coronary artery disease, native vessel, without angina: The patient appears stable. Her LV function is normalized following primary PCI after an acute anterior infarction in 2016. She will remain on aspirin for antiplatelet therapy, carvedilol, and lisinopril.  2. Hypertension: Patient has responded well to the addition of hydrochlorothiazide. She will continue on her current medicines as her blood pressure is well controlled.  3. Hyperlipidemia: The patient is treated with Crestor 20 mg daily. Lipids and LFTs are drawn today. Last labs are reviewed.  Current medicines are reviewed with the patient today.  The patient does  not have concerns regarding medicines.  Labs/ tests ordered today include:  No orders of the defined types were placed in this encounter.   Disposition:   FU 6 months Richardson Dopp, PA-C  Signed, Sherren Mocha, MD  12/11/2016 11:34 AM    [facility] [place], [place], [place]  [address] Phone: [phone]; Fax: [phone]

## 2016-12-11 NOTE — Patient Instructions (Signed)
Medication Instructions:  Your physician recommends that you continue on your current medications as directed. Please refer to the Current Medication list given to you today.  Labwork: Your physician recommends that you have lab work today: LIPID and LIVER  Testing/Procedures: No new orders.   Follow-Up: Your physician wants you to follow-up in: 6 MONTHS with Richardson Dopp PA-C.  You will receive a reminder letter in the mail two months in advance. If you don't receive a letter, please call our office to schedule the follow-up appointment.  Your physician wants you to follow-up in: 1 YEAR with Dr Burt Knack.  You will receive a reminder letter in the mail two months in advance. If you don't receive a letter, please call our office to schedule the follow-up appointment.   Any Other Special Instructions Will Be Listed Below (If Applicable).     If you need a refill on your cardiac medications before your next appointment, please call your pharmacy.

## 2016-12-14 ENCOUNTER — Telehealth: Payer: Self-pay | Admitting: *Deleted

## 2016-12-14 NOTE — Telephone Encounter (Signed)
Pt notified of lab results by phone with verbal understanding. Pt thanked me for my call.   

## 2016-12-14 NOTE — Telephone Encounter (Signed)
-----   Message from Liliane Shi, [place] sent at 12/13/2016  7:42 PM EDT ----- Please call patient. The LDL is at goal.   Other cholesterol parameters are at acceptable levels. The liver enzyme tests are normal. Continue with current treatment plan. Richardson Dopp, PA-C 12/13/2016 7:41 PM  12/13/2016 7:41 PM

## 2017-04-16 ENCOUNTER — Other Ambulatory Visit: Payer: Self-pay | Admitting: Cardiovascular Disease

## 2017-04-16 DIAGNOSIS — I10 Essential (primary) hypertension: Secondary | ICD-10-CM

## 2017-04-16 DIAGNOSIS — I2510 Atherosclerotic heart disease of native coronary artery without angina pectoris: Secondary | ICD-10-CM

## 2017-05-20 ENCOUNTER — Encounter: Payer: Self-pay | Admitting: Family Medicine

## 2017-06-13 NOTE — Progress Notes (Signed)
Cardiology Office Note:    Date:  06/14/2017   ID:  Miranda Wong, DOB 07/11/1946, MRN [id number]  PCP:  Martinique, Betty G, MD  Cardiologist:  Dr. Sherren Mocha    Referring MD: Martinique, Betty G, MD   Chief Complaint  Patient presents with  . Coronary Artery Disease    follow up    History of Present Illness:    Miranda Wong is a 71 y.o. female with a hx of CAD s/p anterior non-STEMI in 10/16. Urgent LHC demonstrated an occluded proximal LAD that was treated with a Xience DES. Ejection fraction was reduced and follow-up echo demonstrated EF 45-50%.  FU Echo in 2/17 demonstrated vigorous LVF with EF 61-60%, mild diastolic dysfunction.  She was changed from Brilinta to Plavix secondary to dyspnea.  Plavix was discontinued after 12 months of dual antiplatelet therapy.  Last seen by Dr. Burt Knack 4/18.  Miranda Wong returns for routine cardiology follow-up.  She is here alone.  She is doing well.  She has not had any chest discomfort consistent with prior angina.  She has not been short of breath with normal activities.  She denies syncope, paroxysmal nocturnal dyspnea.  She has not had significant edema.  She does note pruritus.  This seems to be worse when she is out in the sun.  Prior CV studies:   The following studies were reviewed today:  Echo 10/10/15 Vigorous LVF, EF 65-70%, normal wall motion, grade 1 diastolic dysfunction, normal RVSF, mild TR, PASP 38 mmHg  Echo 06/03/15 Mild LVH, EF 45-50%, anteroseptal HK  LHC 06/02/15 LAD: Proximal 100% >>PCI: 3 x 15 mm Xience DES LCx: Proximal 25% RCA: Mid 25% EF 35-45%  Past Medical History:  Diagnosis Date  . Allergy   . CAD (coronary artery disease)    cath 06/02/2015 25% mid RCA, 25% prox LCx, 100% prox LAD treated with DES (3.0 x 15 mm Xience), EF 40%  . Chicken pox   . Former smoker   . Heart disease   . Hyperlipidemia   . Hypertension   . Mild ichemic cardiomyopathy, EF 45-50% post cath 06/2015 06/04/2015  . NSTEMI  (non-ST elevated myocardial infarction) ([place]) 06/02/2015    Past Surgical History:  Procedure Laterality Date  . CARDIAC CATHETERIZATION N/A 06/02/2015   Procedure: Left Heart Cath and Coronary Angiography;  Surgeon: Sherren Mocha, MD;  Location: [facility];  Service: Cardiovascular;  Laterality: N/A;  . CHOLECYSTECTOMY    . GALLBLADDER SURGERY    . TUBAL LIGATION      Current Medications: Current Meds  Medication Sig  . acetaminophen (TYLENOL) 325 MG tablet Take 650 mg by mouth every 6 (six) hours as needed for headache.  Marland Kitchen aspirin EC 81 MG EC tablet Take 1 tablet (81 mg total) by mouth daily.  Marland Kitchen b complex vitamins tablet Take 1 tablet by mouth every other day.  . carvedilol (COREG) 6.25 MG tablet Take 1 tablet (6.25 mg total) by mouth 2 (two) times daily with a meal.  . fluticasone (FLONASE) 50 MCG/ACT nasal spray Place 2 sprays into both nostrils as needed for allergies or rhinitis.  . hydrochlorothiazide (HYDRODIURIL) 25 MG tablet Take one tablet by mouth on Mondays and Thursdays. Take one-half tablet by mouth on other days.  . hydroxypropyl methylcellulose / hypromellose (ISOPTO TEARS / GONIOVISC) 2.5 % ophthalmic solution Place 1 drop into both eyes as needed for dry eyes.  Marland Kitchen lisinopril (PRINIVIL,ZESTRIL) 10 MG tablet TAKE 1 TABLET BY MOUTH TWICE  A DAY  . nitroGLYCERIN (NITROSTAT) 0.4 MG SL tablet Place 1 tablet (0.4 mg total) under the tongue every 5 (five) minutes x 3 doses as needed for chest pain.  . rosuvastatin (CRESTOR) 20 MG tablet Take 1 tablet (20 mg total) by mouth daily.     Allergies:   Patient has no known allergies.   Social History   Social History  . Marital status: Widowed    Spouse name: N/A  . Number of children: N/A  . Years of education: N/A   Social History Main Topics  . Smoking status: Former Research scientist (life sciences)  . Smokeless tobacco: Never Used     Comment: quit when she was 89s  . Alcohol use No  . Drug use: No  . Sexual activity: Yes    Birth  control/ protection: None   Other Topics Concern  . None   Social History Narrative  . None     Family Hx: The patient's family history includes Heart attack in her father; Valvular heart disease in her mother.  ROS:   Please see the history of present illness.    Review of Systems  Cardiovascular: Positive for leg swelling.   All other systems reviewed and are negative.   EKGs/Labs/Other Test Reviewed:    EKG:  EKG is ordered today.  The ekg ordered today demonstrates normal sinus rhythm, heart rate 61, left axis deviation, interventricular conduction delay, QTC 442 ms, no change since prior tracing  Recent Labs: 12/11/2016: ALT 21   Recent Lipid Panel Lab Results  Component Value Date/Time   CHOL 134 12/11/2016 11:53 AM   TRIG 151 (H) 12/11/2016 11:53 AM   HDL 53 12/11/2016 11:53 AM   CHOLHDL 2.5 12/11/2016 11:53 AM   CHOLHDL 5.0 (H) 07/10/2016 10:37 AM   LDLCALC 51 12/11/2016 11:53 AM    Physical Exam:    VS:  BP (!) 122/58   Pulse 61   Ht 5\' 3"  (1.6 m)   Wt 224 lb 12.8 oz (102 kg)   BMI 39.82 kg/m     Wt Readings from Last 3 Encounters:  06/14/17 224 lb 12.8 oz (102 kg)  12/11/16 225 lb 12.8 oz (102.4 kg)  06/26/16 219 lb 4 oz (99.5 kg)     Physical Exam  Constitutional: She is oriented to person, place, and time. She appears well-developed and well-nourished. No distress.  HENT:  Head: Normocephalic and atraumatic.  Eyes: No scleral icterus.  Neck: No JVD present. Carotid bruit is not present.  Cardiovascular: Normal rate and regular rhythm.   No murmur heard. Pulmonary/Chest: Effort normal. She has no rales.  Abdominal: Soft.  Musculoskeletal: She exhibits no edema.  Neurological: She is alert and oriented to person, place, and time.  Skin: Skin is warm and dry.  Psychiatric: She has a normal mood and affect.    ASSESSMENT:    1. Coronary artery disease involving native coronary artery of native heart without angina pectoris   2. Essential  hypertension   3. Hyperlipidemia, unspecified hyperlipidemia type   4. Gastroesophageal reflux disease without esophagitis    PLAN:    In order of problems listed above:  1.  Coronary artery disease involving native coronary artery of native heart without angina pectoris History of non-ST elevation myocardial infarction in 10/16 treated with drug-eluting stent to the LAD.  She had recovery of LV function.  She is doing well without recurrent anginal symptoms.  Continue aspirin, beta-blocker, statin, ACE inhibitor.  2. Essential hypertension Blood pressure  well controlled.  She does note pruritus at times especially when she is out in the sun.  This may be a side effect of hydrochlorothiazide.  She can try changing this medication to take as needed for edema to see if it helps.  If she decides to do this, I have asked her to monitor her blood pressure.  She should contact me if her blood pressure runs above target.  3. Hyperlipidemia, unspecified hyperlipidemia type LDL optimal on most recent lab work.  Continue current Rx.    4. GERD She has occasional heartburn.  She can try OTC Pepcid 20 mg.  Dispo:  Return in about 6 months (around 12/13/2017) for Routine Follow Up, w/ Dr. Burt Knack.   Medication Adjustments/Labs and Tests Ordered: Current medicines are reviewed at length with the patient today.  Concerns regarding medicines are outlined above.  Tests Ordered: Orders Placed This Encounter  Procedures  . EKG 12-Lead   Medication Changes: No orders of the defined types were placed in this encounter.   Signed, Richardson Dopp, PA-C  06/14/2017 11:18 AM    [facility] [place], [place],   [address] Phone: [phone]; Fax: [phone]

## 2017-06-14 ENCOUNTER — Encounter: Payer: Self-pay | Admitting: Physician Assistant

## 2017-06-14 ENCOUNTER — Ambulatory Visit (INDEPENDENT_AMBULATORY_CARE_PROVIDER_SITE_OTHER): Payer: Medicare Other | Admitting: Physician Assistant

## 2017-06-14 VITALS — BP 122/58 | HR 61 | Ht 63.0 in | Wt 224.8 lb

## 2017-06-14 DIAGNOSIS — I2510 Atherosclerotic heart disease of native coronary artery without angina pectoris: Secondary | ICD-10-CM | POA: Diagnosis not present

## 2017-06-14 DIAGNOSIS — I10 Essential (primary) hypertension: Secondary | ICD-10-CM

## 2017-06-14 DIAGNOSIS — E785 Hyperlipidemia, unspecified: Secondary | ICD-10-CM | POA: Diagnosis not present

## 2017-06-14 DIAGNOSIS — K219 Gastro-esophageal reflux disease without esophagitis: Secondary | ICD-10-CM | POA: Diagnosis not present

## 2017-06-14 DIAGNOSIS — I255 Ischemic cardiomyopathy: Secondary | ICD-10-CM

## 2017-06-14 NOTE — Patient Instructions (Addendum)
Medication Instructions:  No changes  Labwork: None   Testing/Procedures: None   Follow-Up: Dr. Sherren Mocha in 6 months.  Any Other Special Instructions Will Be Listed Below (If Applicable).  If you need a refill on your cardiac medications before your next appointment, please call your pharmacy.  To help with the itchiness, you can try this: Change HCTZ (hydrochlorothiazide) to 1/2 tablet once daily as needed for swelling. Check your blood pressure 3-4 times a week for couple weeks after making the change. Call us if your blood pressure is running 140/90 or higher.  You can try Pepcid (Famotidine) 20 mg Once daily to Twice daily as needed for indigestion.

## 2017-06-25 ENCOUNTER — Encounter: Payer: Medicare Other | Admitting: Family Medicine

## 2017-06-29 ENCOUNTER — Other Ambulatory Visit: Payer: Self-pay | Admitting: Physician Assistant

## 2017-06-29 DIAGNOSIS — I10 Essential (primary) hypertension: Secondary | ICD-10-CM

## 2017-06-29 DIAGNOSIS — I2510 Atherosclerotic heart disease of native coronary artery without angina pectoris: Secondary | ICD-10-CM

## 2017-06-29 MED ORDER — HYDROCHLOROTHIAZIDE 25 MG PO TABS: ORAL_TABLET | ORAL | 3 refills | Status: DC

## 2017-07-12 ENCOUNTER — Other Ambulatory Visit: Payer: Self-pay

## 2017-07-12 DIAGNOSIS — I255 Ischemic cardiomyopathy: Secondary | ICD-10-CM

## 2017-07-12 DIAGNOSIS — I2510 Atherosclerotic heart disease of native coronary artery without angina pectoris: Secondary | ICD-10-CM

## 2017-07-12 MED ORDER — CARVEDILOL 6.25 MG PO TABS: 6.2500 mg | ORAL_TABLET | Freq: Two times a day (BID) | ORAL | 3 refills | Status: DC

## 2017-07-13 ENCOUNTER — Encounter: Payer: Self-pay | Admitting: Physician Assistant

## 2017-07-26 NOTE — Progress Notes (Signed)
HPI:   Ms.Kateri T Westbay is a 71 y.o. female, who is here today for her routine physical. She has Medicare, she does not think he has had a Medicare preventive visit.  Last CPE: Over a year ago.  Regular exercise 3 or more time per week: Walking her dog and active around her house. Following a healthy diet: No,she tells me that she does not like vegetables.   Chronic medical problems: Hyperlipidemia,CAD, obesity, and hypertension among some.   She lives alone but sometimes her granddaughter stays with her. Recently retired. She enjoys gathering with friends and traveling.  Husband died 2 years ago, lung cancer.   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  Independent ADL's and IADL's. No falls in the past year and denies depression symptoms.  Functional Status Survey: Is the patient deaf or have difficulty hearing?: No Does the patient have difficulty seeing, even when wearing glasses/contacts?: No Does the patient have difficulty concentrating, remembering, or making decisions?: No Does the patient have difficulty walking or climbing stairs?: Yes(For the past 2 months due to right knee pain) Does the patient have difficulty dressing or bathing?: No Does the patient have difficulty doing errands alone such as visiting a doctor's office or shopping?: No  Fall Risk  07/27/2017  Falls in the past year? No    Providers she sees regularly:  Eye care provider: Provider at [facility] and [facility]. Cardiologist, Dr Burt Knack 2 times per year.  Depression screen [facility] 2/9 07/27/2017  Decreased Interest 0  Down, Depressed, Hopeless 0  PHQ - 2 Score 0    Mini-Cog - 07/27/17 0913    Normal clock drawing test?  yes    How many words correct?  3        Hearing Screening   125Hz  250Hz  500Hz  1000Hz  2000Hz  3000Hz  4000Hz  6000Hz  8000Hz   Right ear:   Pass Pass Pass  Pass    Left ear:   Pass Pass Pass  Pass      Visual Acuity Screening   Right eye Left eye Both eyes    Without correction:     With correction: 20/20 20/20 20/20     Immunization History  Administered Date(s) Administered  . Zoster 06/26/2016    Mammogram: It was ordered last visit, not done. Colonoscopy: Never and she is not interested in doing so. DEXA: Never She does not take Vit D or Ca++ supplementation.  Hep C screening: 07/10/2016 negative.  Concerns today:  Right knee pain and mild edema. 2 months ago while she was walking her dog, she pull the leash and her knee twisted. +Stiffness. She has stairs in her house and pain is worse with walking them up and down.  4/10,intermittently.sharp pain, and occasional "tingles" on medial aspect of knee for a few seconds. No limitation of ROM and no erythema.  Knee pain is gradually getting better.   HLD:  She is on Crestor 20 mg daily. She is trying to follow a low fat diet.  Lab Results  Component Value Date   CHOL 134 12/11/2016   HDL 53 12/11/2016   LDLCALC 51 12/11/2016   TRIG 151 (H) 12/11/2016   CHOLHDL 2.5 12/11/2016   HTN: She is on Coreg 6.25 mg bid, Lisinopril 10 mg daily, and HCTZ 25 mg daily.   Review of Systems  Constitutional: Negative for appetite change, fatigue and fever.  HENT: Negative for hearing loss, mouth sores, sore throat, trouble swallowing and voice change.  Eyes: Negative for redness and visual disturbance.  Respiratory: Negative for cough, shortness of breath and wheezing.   Cardiovascular: Negative for chest pain and leg swelling.  Gastrointestinal: Negative for abdominal pain, blood in stool, nausea and vomiting.       No changes in bowel habits.  Endocrine: Negative for cold intolerance, heat intolerance, polydipsia, polyphagia and polyuria.  Genitourinary: Negative for decreased urine volume, dysuria, hematuria, vaginal bleeding and vaginal discharge.  Musculoskeletal: Positive for arthralgias. Negative for back pain, gait problem and neck pain.  Skin: Negative for color change and  rash.  Allergic/Immunologic: Positive for environmental allergies.  Neurological: Negative for syncope, weakness and headaches.  Hematological: Negative for adenopathy. Does not bruise/bleed easily.  Psychiatric/Behavioral: Negative for confusion and sleep disturbance. The patient is not nervous/anxious.   All other systems reviewed and are negative.     Current Outpatient Medications on File Prior to Visit  Medication Sig Dispense Refill  . acetaminophen (TYLENOL) 325 MG tablet Take 650 mg by mouth every 6 (six) hours as needed for headache.    Marland Kitchen aspirin EC 81 MG EC tablet Take 1 tablet (81 mg total) by mouth daily.    Marland Kitchen b complex vitamins tablet Take 1 tablet by mouth every other day.    . carvedilol (COREG) 6.25 MG tablet Take 1 tablet (6.25 mg total) 2 (two) times daily with a meal by mouth. 180 tablet 3  . fluticasone (FLONASE) 50 MCG/ACT nasal spray Place 2 sprays into both nostrils as needed for allergies or rhinitis.    . hydrochlorothiazide (HYDRODIURIL) 25 MG tablet Take one tablet by mouth on Mondays and Thursdays. Take one-half tablet by mouth on other days. 60 tablet 3  . hydroxypropyl methylcellulose / hypromellose (ISOPTO TEARS / GONIOVISC) 2.5 % ophthalmic solution Place 1 drop into both eyes as needed for dry eyes.    Marland Kitchen lisinopril (PRINIVIL,ZESTRIL) 10 MG tablet TAKE 1 TABLET BY MOUTH TWICE A DAY 180 tablet 3  . nitroGLYCERIN (NITROSTAT) 0.4 MG SL tablet Place 1 tablet (0.4 mg total) under the tongue every 5 (five) minutes x 3 doses as needed for chest pain. 25 tablet 3  . rosuvastatin (CRESTOR) 20 MG tablet Take 1 tablet (20 mg total) by mouth daily. 90 tablet 3   No current facility-administered medications on file prior to visit.      Past Medical History:  Diagnosis Date  . Allergy   . CAD (coronary artery disease)    cath 06/02/2015 25% mid RCA, 25% prox LCx, 100% prox LAD treated with DES (3.0 x 15 mm Xience), EF 40%  . Chicken pox   . Former smoker   . Heart  disease   . Hyperlipidemia   . Hypertension   . Mild ichemic cardiomyopathy, EF 45-50% post cath 06/2015 06/04/2015  . NSTEMI (non-ST elevated myocardial infarction) ([place]) 06/02/2015    Past Surgical History:  Procedure Laterality Date  . CARDIAC CATHETERIZATION N/A 06/02/2015   Procedure: Left Heart Cath and Coronary Angiography;  Surgeon: Sherren Mocha, MD;  Location: [facility];  Service: Cardiovascular;  Laterality: N/A;  . CHOLECYSTECTOMY    . GALLBLADDER SURGERY    . TUBAL LIGATION      No Known Allergies  Family History  Problem Relation Age of Onset  . Heart attack Father   . Valvular heart disease Mother     Social History   Socioeconomic History  . Marital status: Widowed    Spouse name: None  . Number of children:  None  . Years of education: None  . Highest education level: None  Social Needs  . Financial resource strain: None  . Food insecurity - worry: None  . Food insecurity - inability: None  . Transportation needs - medical: None  . Transportation needs - non-medical: None  Occupational History  . None  Tobacco Use  . Smoking status: Former Research scientist (life sciences)  . Smokeless tobacco: Never Used  . Tobacco comment: quit when she was 30s  Substance and Sexual Activity  . Alcohol use: No    Alcohol/week: 0.0 oz  . Drug use: No  . Sexual activity: Yes    Birth control/protection: None  Other Topics Concern  . None  Social History Narrative  . None     Vitals:   07/27/17 0853 07/27/17 0947  BP: 122/75   Pulse: (!) 58 60  Resp: 12   Temp: 97.7 F (36.5 C)   SpO2: 97%    Body mass index is 40.26 kg/m.   Wt Readings from Last 3 Encounters:  07/27/17 227 lb 4 oz (103.1 kg)  06/14/17 224 lb 12.8 oz (102 kg)  12/11/16 225 lb 12.8 oz (102.4 kg)     Physical Exam  Nursing note and vitals reviewed. Constitutional: She is oriented to person, place, and time. She appears well-developed. No distress.  HENT:  Head: Normocephalic and atraumatic.    Mouth/Throat: Oropharynx is clear and moist and mucous membranes are normal. She has dentures.  Eyes: Conjunctivae and EOM are normal. Pupils are equal, round, and reactive to light.  Neck: No tracheal deviation present. No thyroid mass and no thyromegaly present.  Cardiovascular: Normal rate and regular rhythm.  No murmur heard. Pulses:      Dorsalis pedis pulses are 2+ on the right side, and 2+ on the left side.  Respiratory: Effort normal and breath sounds normal. No respiratory distress.  GI: Soft. She exhibits no mass. There is no hepatomegaly. There is no tenderness.  Genitourinary:  Genitourinary Comments: Breast: No masses,skin changes,or nipple discharge bilateral.  Musculoskeletal: She exhibits no edema.  Knee: on inspection no effusion, erythema, or deformities. Valgus and varus stress normal,  anterior and posterior drawer test negative. Patellar apprehension test negative. ROM with no significant limitation. No tenderness upon palpation of medial inter articular line but some tenderness upon crossing leg on leg one.  Lymphadenopathy:    She has no cervical adenopathy.    She has no axillary adenopathy.       Right: No supraclavicular adenopathy present.       Left: No supraclavicular adenopathy present.  Neurological: She is alert and oriented to person, place, and time. She has normal strength. No cranial nerve deficit. Coordination and gait normal.  Reflex Scores:      Bicep reflexes are 2+ on the right side and 2+ on the left side.      Patellar reflexes are 2+ on the right side and 2+ on the left side. Skin: Skin is warm. No erythema.  Psychiatric: She has a normal mood and affect. Cognition and memory are normal.  Well groomed, good eye contact.     ASSESSMENT AND PLAN:   Ms. Kiari was seen today for annual exam.  Diagnoses and all orders for this visit:  Lab Results  Component Value Date   CHOL 168 07/27/2017   HDL 52.30 07/27/2017   LDLCALC 88  07/27/2017   TRIG 141.0 07/27/2017   CHOLHDL 3 07/27/2017   Lab Results  Component Value Date  CREATININE 0.98 07/27/2017   BUN 21 07/27/2017   NA 137 07/27/2017   K 3.7 07/27/2017   CL 103 07/27/2017   CO2 26 07/27/2017    Encounter for Medicare annual wellness exam  We discussed the importance of staying active, physically and mentally, as well as the benefits of a healthy/balnace diet. Low impact exercise that involve stretching and strengthing are ideal. Vaccines not up to date. We discussed preventive screening for the next 5-10 years, summery of recommendations given in AVS:   Colon cancer screening: She would like to do Cologuard.  Glaucoma screening/eye exam every 1-2 years.  Mammogram for breast cancer screening annually.  Flu vaccine annually. Prevnar 13 and pneumovax : Refused. Bone density for osteoporosis (refused) Ca++ with Vit D recommended, the former one ideally through her diet.  Diabetes screening annually.  Fall prevention Advance directives and end of life discussed, she doe snot have POA or living will, she has information and knows it can be done on line.    Screening for malignant neoplasm of breast -     MM SCREENING BREAST TOMO BILATERAL; Future  Hyperlipidemia, unspecified hyperlipidemia type  No changes in current management, will follow labs done today and will give further recommendations accordingly. F/U in 6-12 months.  -     Lipid panel  Essential hypertension  Adequately controlled. No changes in current management. DASH diet recommended. Eye exam recommended annually. F/U in 6 months with Dr Burt Knack and I will see her in a year, before if needed.  -     Basic metabolic panel  Coronary artery disease involving native coronary artery of native heart without angina pectoris  Asymptomatic. She is on BB,statin, and Aspirin 81 mg daily. Continue following with Dr Burt Knack.  Colon cancer screening -     Cologuard  Need for  influenza vaccination -     Flu vaccine HIGH DOSE PF     Return in 1 year (on 07/27/2018) for Medicare preventive and f/u.          Coye Dawood G. Martinique, MD  [facility]. [place] office.

## 2017-07-27 ENCOUNTER — Encounter: Payer: Self-pay | Admitting: Family Medicine

## 2017-07-27 ENCOUNTER — Ambulatory Visit (INDEPENDENT_AMBULATORY_CARE_PROVIDER_SITE_OTHER): Payer: Medicare Other | Admitting: Family Medicine

## 2017-07-27 VITALS — BP 122/75 | HR 60 | Temp 97.7°F | Resp 12 | Ht 63.0 in | Wt 227.2 lb

## 2017-07-27 DIAGNOSIS — Z0000 Encounter for general adult medical examination without abnormal findings: Secondary | ICD-10-CM

## 2017-07-27 DIAGNOSIS — E785 Hyperlipidemia, unspecified: Secondary | ICD-10-CM | POA: Diagnosis not present

## 2017-07-27 DIAGNOSIS — Z1211 Encounter for screening for malignant neoplasm of colon: Secondary | ICD-10-CM

## 2017-07-27 DIAGNOSIS — Z1239 Encounter for other screening for malignant neoplasm of breast: Secondary | ICD-10-CM

## 2017-07-27 DIAGNOSIS — I10 Essential (primary) hypertension: Secondary | ICD-10-CM

## 2017-07-27 DIAGNOSIS — Z23 Encounter for immunization: Secondary | ICD-10-CM | POA: Diagnosis not present

## 2017-07-27 DIAGNOSIS — Z1231 Encounter for screening mammogram for malignant neoplasm of breast: Secondary | ICD-10-CM | POA: Diagnosis not present

## 2017-07-27 DIAGNOSIS — R739 Hyperglycemia, unspecified: Secondary | ICD-10-CM

## 2017-07-27 DIAGNOSIS — I2510 Atherosclerotic heart disease of native coronary artery without angina pectoris: Secondary | ICD-10-CM

## 2017-07-27 LAB — LIPID PANEL
Cholesterol: 168 mg/dL (ref 0–200)
HDL: 52.3 mg/dL (ref >39.00)
LDL CALC: 88 mg/dL (ref 0–99)
NONHDL: 115.72
Total CHOL/HDL Ratio: 3
Triglycerides: 141 mg/dL (ref 0.0–149.0)
VLDL: 28.2 mg/dL (ref 0.0–40.0)

## 2017-07-27 LAB — BASIC METABOLIC PANEL
BUN: 21 mg/dL (ref 6–23)
CALCIUM: 9.3 mg/dL (ref 8.4–10.5)
CO2: 26 mEq/L (ref 19–32)
Chloride: 103 mEq/L (ref 96–112)
Creatinine, Ser: 0.98 mg/dL (ref 0.40–1.20)
GFR: 59.72 mL/min — ABNORMAL LOW (ref >60.00)
GLUCOSE: 138 mg/dL — AB (ref 70–99)
Potassium: 3.7 mEq/L (ref 3.5–5.1)
SODIUM: 137 meq/L (ref 135–145)

## 2017-07-27 NOTE — Patient Instructions (Addendum)
A few things to remember from today's visit:   Encounter for Medicare annual wellness exam  Screening for malignant neoplasm of breast - Plan: MM SCREENING BREAST TOMO BILATERAL  Hyperlipidemia, unspecified hyperlipidemia type - Plan: Lipid panel  Essential hypertension - Plan: Basic metabolic panel  Coronary artery disease involving native coronary artery of native heart without angina pectoris  Colon cancer screening - Plan: Cologuard   A few tips:  -As we age balance is not as good as it was, so there is a higher risks for falls. Please remove small rugs and furniture that is "in your way" and could increase the risk of falls. Stretching exercises may help with fall prevention: Yoga and Tai Chi are some examples. Low impact exercise is better, so you are not very achy the next day.  -Sun screen and avoidance of direct sun light recommended. Caution with dehydration, if working outdoors be sure to drink enough fluids.  - Some medications are not safe as we age, increases the risk of side effects and can potentially interact with other medication you are also taken;  including some of over the counter medications. Be sure to let me know when you start a new medication even if it is a dietary/vitamin supplement.   -Healthy diet low in red meet/animal fat and sugar + regular physical activity is recommended.       Screening schedule for the next 5-10 years:  Colon cancer screening: Cologuand.  Glaucoma screening/eye exam every 1-2 years.  Mammogram for breast cancer screening annually.  Flu vaccine annually. Prevnar 13 and pneumovax : Vaccines for pneumonia and recommended. Bone density for osteoporosis. Diabetes screening   Fall prevention   Advance directives:  Please see a lawyer and/or go to this website to help you with advanced directives and designating a health care power of attorney so that your wishes will be followed should you become too ill to make your  own medical decisions.  [email or web address]       Please be sure medication list is accurate. If a new problem present, please set up appointment sooner than planned today.

## 2017-08-01 ENCOUNTER — Encounter: Payer: Self-pay | Admitting: Family Medicine

## 2017-08-03 ENCOUNTER — Encounter: Payer: Medicare Other | Admitting: Family Medicine

## 2017-08-27 ENCOUNTER — Ambulatory Visit
Admission: RE | Admit: 2017-08-27 | Discharge: 2017-08-27 | Disposition: A | Discharge status: Home / Self Care | Payer: Medicare Other | Source: Ambulatory Visit | Attending: Family Medicine | Admitting: Family Medicine

## 2017-08-27 DIAGNOSIS — Z1239 Encounter for other screening for malignant neoplasm of breast: Secondary | ICD-10-CM

## 2017-08-27 DIAGNOSIS — Z1231 Encounter for screening mammogram for malignant neoplasm of breast: Secondary | ICD-10-CM | POA: Diagnosis not present

## 2017-08-30 ENCOUNTER — Other Ambulatory Visit: Payer: Self-pay | Admitting: Family Medicine

## 2017-08-30 DIAGNOSIS — R928 Other abnormal and inconclusive findings on diagnostic imaging of breast: Secondary | ICD-10-CM

## 2017-09-06 ENCOUNTER — Ambulatory Visit
Admission: RE | Admit: 2017-09-06 | Discharge: 2017-09-06 | Disposition: A | Discharge status: Home / Self Care | Payer: Medicare Other | Source: Ambulatory Visit | Attending: Family Medicine | Admitting: Family Medicine

## 2017-09-06 DIAGNOSIS — R928 Other abnormal and inconclusive findings on diagnostic imaging of breast: Secondary | ICD-10-CM

## 2017-09-06 DIAGNOSIS — N6489 Other specified disorders of breast: Secondary | ICD-10-CM | POA: Diagnosis not present

## 2017-09-06 DIAGNOSIS — Z1211 Encounter for screening for malignant neoplasm of colon: Secondary | ICD-10-CM | POA: Diagnosis not present

## 2017-09-06 DIAGNOSIS — Z1212 Encounter for screening for malignant neoplasm of rectum: Secondary | ICD-10-CM | POA: Diagnosis not present

## 2017-09-06 LAB — COLOGUARD: COLOGUARD: NEGATIVE

## 2017-09-17 ENCOUNTER — Encounter: Payer: Self-pay | Admitting: Family Medicine

## 2017-09-22 ENCOUNTER — Other Ambulatory Visit: Payer: Self-pay | Admitting: Physician Assistant

## 2017-09-22 DIAGNOSIS — E7800 Pure hypercholesterolemia, unspecified: Secondary | ICD-10-CM

## 2017-09-22 DIAGNOSIS — I2510 Atherosclerotic heart disease of native coronary artery without angina pectoris: Secondary | ICD-10-CM

## 2017-10-05 ENCOUNTER — Other Ambulatory Visit: Payer: Self-pay | Admitting: Physician Assistant

## 2017-10-05 ENCOUNTER — Encounter: Payer: Self-pay | Admitting: Family Medicine

## 2017-10-05 DIAGNOSIS — I2510 Atherosclerotic heart disease of native coronary artery without angina pectoris: Secondary | ICD-10-CM

## 2017-10-05 MED ORDER — LISINOPRIL 10 MG PO TABS: 10.0000 mg | ORAL_TABLET | Freq: Two times a day (BID) | ORAL | 2 refills | Status: DC

## 2018-01-10 ENCOUNTER — Encounter: Payer: Self-pay | Admitting: Cardiovascular Disease

## 2018-01-10 ENCOUNTER — Ambulatory Visit (INDEPENDENT_AMBULATORY_CARE_PROVIDER_SITE_OTHER): Payer: Medicare Other | Admitting: Cardiovascular Disease

## 2018-01-10 VITALS — BP 122/74 | HR 62 | Ht 63.0 in | Wt 227.0 lb

## 2018-01-10 DIAGNOSIS — I2510 Atherosclerotic heart disease of native coronary artery without angina pectoris: Secondary | ICD-10-CM

## 2018-01-10 DIAGNOSIS — I10 Essential (primary) hypertension: Secondary | ICD-10-CM | POA: Diagnosis not present

## 2018-01-10 NOTE — Patient Instructions (Signed)
Medication Instructions:  Your provider recommends that you continue on your current medications as directed. Please refer to the Current Medication list given to you today.    Labwork: None  Testing/Procedures: None  Follow-Up: Your provider wants you to follow-up in: 1 year with Dr. Burt Knack. You will receive a reminder letter in the mail two months in advance. If you don't receive a letter, please call our office to schedule the follow-up appointment.    Any Other Special Instructions Will Be Listed Below (If Applicable).   For your  leg edema you  should do  the following 1. Leg elevation - I recommend the Lounge Dr. Leg rest.  See below for details  2. Salt restriction  -  Use potassium chloride instead of regular salt as a salt substitute. 3. Walk regularly 4. Compression hose - [facility] 5. Weight loss    Available on [email or web address] Or  Go to [email or web address]     If you need a refill on your cardiac medications before your next appointment, please call your pharmacy.

## 2018-01-10 NOTE — Progress Notes (Signed)
Cardiology Office Note Date:  01/10/2018   ID:  ILANI OTTERSON, DOB [birth year], MRN [id number]  PCP:  Martinique, Betty G, MD  Cardiologist:  Sherren Mocha, MD    Chief Complaint  Patient presents with  . Coronary Artery Disease     History of Present Illness: Miranda Wong is a 72 y.o. female who presents for follow-up of CAD. The patient presented with a NSTEMI in 2016 and was found to have total occlusion of the proximal LAD treated with a Xience DES. Initially the patient's LV function was reduced but on follow-up echo this normalized with an LVEF of 65%.  The patient is here alone today.  Overall she is doing well.  She denies chest pain or shortness of breath.  She has some problems with leg swelling in the right leg.  States that after night of sleep there is no problem with edema, but when she is up on her feet she developed swelling throughout the day especially in the summer months.  No claudication symptoms.  No orthopnea or PND.   Past Medical History:  Diagnosis Date  . Allergy   . CAD (coronary artery disease)    cath 06/02/2015 25% mid RCA, 25% prox LCx, 100% prox LAD treated with DES (3.0 x 15 mm Xience), EF 40%  . Chicken pox   . Former smoker   . Heart disease   . Hyperlipidemia   . Hypertension   . Mild ichemic cardiomyopathy, EF 45-50% post cath 06/2015 06/04/2015  . NSTEMI (non-ST elevated myocardial infarction) ([place]) 06/02/2015    Past Surgical History:  Procedure Laterality Date  . CARDIAC CATHETERIZATION N/A 06/02/2015   Procedure: Left Heart Cath and Coronary Angiography;  Surgeon: Sherren Mocha, MD;  Location: [facility];  Service: Cardiovascular;  Laterality: N/A;  . CHOLECYSTECTOMY    . GALLBLADDER SURGERY    . TUBAL LIGATION      Current Outpatient Medications  Medication Sig Dispense Refill  . acetaminophen (TYLENOL) 325 MG tablet Take 650 mg by mouth every 6 (six) hours as needed for headache.    Marland Kitchen aspirin EC 81 MG EC tablet Take 1  tablet (81 mg total) by mouth daily.    Marland Kitchen b complex vitamins tablet Take 1 tablet by mouth every other day.    . carvedilol (COREG) 6.25 MG tablet Take 1 tablet (6.25 mg total) 2 (two) times daily with a meal by mouth. 180 tablet 3  . fluticasone (FLONASE) 50 MCG/ACT nasal spray Place 2 sprays into both nostrils as needed for allergies or rhinitis.    . hydrochlorothiazide (HYDRODIURIL) 25 MG tablet Take one tablet by mouth on Mondays and Thursdays. Take one-half tablet by mouth on other days. 60 tablet 3  . hydroxypropyl methylcellulose / hypromellose (ISOPTO TEARS / GONIOVISC) 2.5 % ophthalmic solution Place 1 drop into both eyes as needed for dry eyes.    Marland Kitchen lisinopril (PRINIVIL,ZESTRIL) 10 MG tablet Take 1 tablet (10 mg total) by mouth 2 (two) times daily. 180 tablet 2  . nitroGLYCERIN (NITROSTAT) 0.4 MG SL tablet Place 1 tablet (0.4 mg total) under the tongue every 5 (five) minutes x 3 doses as needed for chest pain. 25 tablet 3  . rosuvastatin (CRESTOR) 40 MG tablet TAKE 1 TABLET BY MOUTH EVERY DAY 90 tablet 2   No current facility-administered medications for this visit.     Allergies:   Patient has no known allergies.   Social History:  The patient  reports that  she has quit smoking. She has never used smokeless tobacco. She reports that she does not drink alcohol or use drugs.   Family History:  The patient's family history includes Breast cancer in her mother; Heart attack in her father; Valvular heart disease in her mother.    ROS:  Please see the history of present illness.  Otherwise, review of systems is positive for cough.  All other systems are reviewed and negative.    PHYSICAL EXAM: VS:  BP 122/74   Pulse 62   Ht 5\' 3"  (1.6 m)   Wt 227 lb (103 kg)   SpO2 94%   BMI 40.21 kg/m  , BMI Body mass index is 40.21 kg/m. GEN: Well nourished, well developed, pleasant obese woman in no acute distress  HEENT: normal  Neck: no JVD, no masses. No carotid bruits Cardiac: RRR  without murmur or gallop      Respiratory:  clear to auscultation bilaterally, normal work of breathing GI: soft, nontender, nondistended, + BS MS: no deformity or atrophy  Ext: no pretibial edema, pedal pulses 2+= bilaterally Skin: warm and dry, no rash. Few telangiectasias and varicosities noted in the right lower leg Neuro:  Strength and sensation are intact Psych: euthymic mood, full affect  EKG:  EKG is not ordered today.   Recent Labs: 07/27/2017: BUN 21; Creatinine, Ser 0.98; Potassium 3.7; Sodium 137   Lipid Panel     Component Value Date/Time   CHOL 168 07/27/2017 1002   CHOL 134 12/11/2016 1153   TRIG 141.0 07/27/2017 1002   HDL 52.30 07/27/2017 1002   HDL 53 12/11/2016 1153   CHOLHDL 3 07/27/2017 1002   VLDL 28.2 07/27/2017 1002   LDLCALC 88 07/27/2017 1002   LDLCALC 51 12/11/2016 1153      Wt Readings from Last 3 Encounters:  01/10/18 227 lb (103 kg)  07/27/17 227 lb 4 oz (103.1 kg)  06/14/17 224 lb 12.8 oz (102 kg)     ASSESSMENT AND PLAN: 1.  CAD, native vessel, without angina: The patient appears to be doing well.  She continues on aspirin 81 mg, carvedilol, lisinopril, and rosuvastatin.  2.  Hyperlipidemia: Treated with a high intensity statin drug using rosuvastatin 40 mg.  Last lipids reviewed.  Lifestyle modification counseling done.  3.  Hypertension: Blood pressure is well controlled on a combination of lisinopril, hydrochlorothiazide, and carvedilol.  4.  Leg swelling: Suspect this is primarily related to venous insufficiency.  We discussed conservative management including weight loss, sodium restriction, and leg elevation.  She is not interested in compression stockings.  Current medicines are reviewed with the patient today.  The patient does not have concerns regarding medicines.  Labs/ tests ordered today include:  No orders of the defined types were placed in this encounter.   Disposition:   FU 1 year  Signed, Sherren Mocha, MD    01/10/2018 1:43 PM    [facility] [address], [place], [place]  [address] Phone: [phone]; Fax: [phone]

## 2018-03-21 ENCOUNTER — Encounter: Payer: Self-pay | Admitting: Physician Assistant

## 2018-03-21 DIAGNOSIS — E785 Hyperlipidemia, unspecified: Secondary | ICD-10-CM

## 2018-03-21 NOTE — Telephone Encounter (Signed)
Please see question from patient. PLAN: 1. Stop Crestor 2. Start Pravastatin 40 mg QHS 3. Lipids and LFTs in 3 mos Richardson Dopp, [place]    03/21/2018 10:17 PM

## 2018-03-22 MED ORDER — PRAVASTATIN SODIUM 40 MG PO TABS: 40.0000 mg | ORAL_TABLET | Freq: Every evening | ORAL | 3 refills | Status: DC

## 2018-03-22 NOTE — Telephone Encounter (Signed)
Pt has been advised to start Pravastatin 40 mg w/LFT, LIPIDS 3 months. Pt asked for 3 month f/u as well. Rx sent to [facility]. Appt w/PA and lab work 06/27/18. Pt thanked me for the call.

## 2018-03-22 NOTE — Telephone Encounter (Signed)
Left message to call back to go over recommendations

## 2018-06-20 ENCOUNTER — Other Ambulatory Visit: Payer: Medicare Other

## 2018-06-27 ENCOUNTER — Ambulatory Visit (INDEPENDENT_AMBULATORY_CARE_PROVIDER_SITE_OTHER): Payer: Medicare Other | Admitting: Physician Assistant

## 2018-06-27 ENCOUNTER — Encounter: Payer: Self-pay | Admitting: Physician Assistant

## 2018-06-27 ENCOUNTER — Telehealth: Payer: Self-pay | Admitting: *Deleted

## 2018-06-27 VITALS — BP 122/62 | HR 58 | Ht 63.0 in | Wt 225.8 lb

## 2018-06-27 DIAGNOSIS — I10 Essential (primary) hypertension: Secondary | ICD-10-CM

## 2018-06-27 DIAGNOSIS — E785 Hyperlipidemia, unspecified: Secondary | ICD-10-CM

## 2018-06-27 DIAGNOSIS — I2510 Atherosclerotic heart disease of native coronary artery without angina pectoris: Secondary | ICD-10-CM

## 2018-06-27 LAB — LIPID PANEL
CHOL/HDL RATIO: 2.9 ratio (ref 0.0–4.4)
Cholesterol, Total: 147 mg/dL (ref 100–199)
HDL: 50 mg/dL (ref >39)
LDL Calculated: 67 mg/dL (ref 0–99)
Triglycerides: 152 mg/dL — ABNORMAL HIGH (ref 0–149)
VLDL Cholesterol Cal: 30 mg/dL (ref 5–40)

## 2018-06-27 LAB — HEPATIC FUNCTION PANEL
ALBUMIN: 4 g/dL (ref 3.5–4.8)
ALK PHOS: 82 IU/L (ref 39–117)
ALT: 29 IU/L (ref 0–32)
AST: 33 IU/L (ref 0–40)
BILIRUBIN, DIRECT: 0.16 mg/dL (ref 0.00–0.40)
Bilirubin Total: 0.5 mg/dL (ref 0.0–1.2)
TOTAL PROTEIN: 6.3 g/dL (ref 6.0–8.5)

## 2018-06-27 NOTE — Telephone Encounter (Signed)
Pt has been notified of lab results by phone with verbal understanding. Pt thanked me for the call.,

## 2018-06-27 NOTE — Progress Notes (Signed)
Cardiology Office Note:    Date:  06/27/2018   ID:  Miranda Wong, DOB 1946/08/05, MRN [id number]  PCP:  Martinique, Betty G, MD  Cardiologist:  Sherren Mocha, MD   Electrophysiologist:  None   Referring MD: Martinique, Betty G, MD   Chief Complaint  Patient presents with  . Follow-up    CAD     History of Present Illness:    Miranda Wong is a 72 y.o. female with CAD s/p anterior non-STEMI in 10/16. Urgent LHC demonstrated an occluded proximal LAD that was treated with a Xience DES. Ejection fraction was reduced and follow-up echo demonstrated EF 45-50%. FU Echo in 2/17 demonstrated vigorous LVF with EF 02-58%, mild diastolic dysfunction. Last seen by Dr. Burt Knack in May 2019.  Since that time, her rosuvastatin has been changed to pravastatin due to cost.  She had issues in the past with myalgias secondary to atorvastatin.  Miranda Wong returns for follow-up.  She is here alone.  She has occasional right sided flank discomfort with positional changes.  She denies substernal chest discomfort with exertion.  She denies significant shortness of breath.  She denies syncope.  She denies PND.  She has chronic pedal edema that seems to be related to prolonged standing.  She has a chronic, nonproductive cough.  Prior CV studies:   The following studies were reviewed today:  Echo 10/10/15 Vigorous LVF, EF 65-70%, normal wall motion, grade 1 diastolic dysfunction, normal RVSF, mild TR, PASP 38 mmHg  Echo 06/03/15 Mild LVH, EF 45-50%, anteroseptal HK  LHC 06/02/15 LAD: Proximal 100% >>PCI: 3 x 15 mm Xience DES LCx: Proximal 25% RCA: Mid 25% EF 35-45%  Past Medical History:  Diagnosis Date  . Allergy   . CAD (coronary artery disease)    cath 06/02/2015 25% mid RCA, 25% prox LCx, 100% prox LAD treated with DES (3.0 x 15 mm Xience), EF 40%  . Chicken pox   . Former smoker   . Heart disease   . Hyperlipidemia   . Hypertension   . Mild ichemic cardiomyopathy, EF 45-50% post cath 06/2015  06/04/2015  . NSTEMI (non-ST elevated myocardial infarction) ([place]) 06/02/2015   Surgical Hx: The patient  has a past surgical history that includes Cholecystectomy; Cardiac catheterization (N/A, 06/02/2015); Gallbladder surgery; and Tubal ligation.   Current Medications: Current Meds  Medication Sig  . acetaminophen (TYLENOL) 325 MG tablet Take 650 mg by mouth every 6 (six) hours as needed for headache.  Marland Kitchen aspirin EC 81 MG EC tablet Take 1 tablet (81 mg total) by mouth daily.  Marland Kitchen b complex vitamins tablet Take 1 tablet by mouth every other day.  . carvedilol (COREG) 6.25 MG tablet Take 1 tablet (6.25 mg total) 2 (two) times daily with a meal by mouth.  . fluticasone (FLONASE) 50 MCG/ACT nasal spray Place 2 sprays into both nostrils as needed for allergies or rhinitis.  . hydrochlorothiazide (HYDRODIURIL) 25 MG tablet Take one tablet by mouth on Mondays and Thursdays. Take one-half tablet by mouth on other days.  . hydroxypropyl methylcellulose / hypromellose (ISOPTO TEARS / GONIOVISC) 2.5 % ophthalmic solution Place 1 drop into both eyes as needed for dry eyes.  Marland Kitchen lisinopril (PRINIVIL,ZESTRIL) 10 MG tablet Take 1 tablet (10 mg total) by mouth 2 (two) times daily.  . nitroGLYCERIN (NITROSTAT) 0.4 MG SL tablet Place 1 tablet (0.4 mg total) under the tongue every 5 (five) minutes x 3 doses as needed for chest pain.  . pravastatin (PRAVACHOL) 40  MG tablet Take 1 tablet (40 mg total) by mouth every evening.     Allergies:   Patient has no known allergies.   Social History   Tobacco Use  . Smoking status: Former Research scientist (life sciences)  . Smokeless tobacco: Never Used  . Tobacco comment: quit when she was 30s  Substance Use Topics  . Alcohol use: No    Alcohol/week: 0.0 standard drinks  . Drug use: No     Family Hx: The patient's family history includes Breast cancer in her mother; Heart attack in her father; Valvular heart disease in her mother.  ROS:   Please see the history of present illness.      Review of Systems  Cardiovascular: Positive for leg swelling.  Respiratory: Positive for cough.    All other systems reviewed and are negative.   EKGs/Labs/Other Test Reviewed:    EKG:  EKG is  ordered today.  The ekg ordered today demonstrates sinus bradycardia, heart rate 58, left axis deviation IVCD, QTC 426, similar to old EKGs  Recent Labs: 07/27/2017: BUN 21; Creatinine, Ser 0.98; Potassium 3.7; Sodium 137   Recent Lipid Panel Lab Results  Component Value Date/Time   CHOL 168 07/27/2017 10:02 AM   CHOL 134 12/11/2016 11:53 AM   TRIG 141.0 07/27/2017 10:02 AM   HDL 52.30 07/27/2017 10:02 AM   HDL 53 12/11/2016 11:53 AM   CHOLHDL 3 07/27/2017 10:02 AM   LDLCALC 88 07/27/2017 10:02 AM   LDLCALC 51 12/11/2016 11:53 AM    Physical Exam:    VS:  BP 122/62   Pulse (!) 58   Ht 5\' 3"  (1.6 m)   Wt 225 lb 12.8 oz (102.4 kg)   SpO2 97%   BMI 40.00 kg/m     Wt Readings from Last 3 Encounters:  06/27/18 225 lb 12.8 oz (102.4 kg)  01/10/18 227 lb (103 kg)  07/27/17 227 lb 4 oz (103.1 kg)     Physical Exam  Constitutional: She is oriented to person, place, and time. She appears well-developed and well-nourished. No distress.  HENT:  Head: Normocephalic and atraumatic.  Eyes: No scleral icterus.  Neck: No JVD present.  Cardiovascular: Normal rate and regular rhythm.  No murmur heard. Pulmonary/Chest: Effort normal. She has no rales.  Abdominal: Soft.  Musculoskeletal: She exhibits edema (trace bilat LE edema).  Neurological: She is alert and oriented to person, place, and time.  Skin: Skin is warm and dry.    ASSESSMENT & PLAN:    Coronary artery disease involving native coronary artery of native heart without angina pectoris  History of non-ST elevation myocardial infarction in October 2016 treated with drug-eluting stent to LAD.  Continue aspirin, statin, beta-blocker.  Essential hypertension The patient's blood pressure is controlled on her current regimen.   Continue current therapy.   Hyperlipidemia, unspecified hyperlipidemia type  Rosuvastatin was recently changed to pravastatin due to cost.  Check lipids and LFTs today.   Dispo:  Return in about 7 months (around 01/26/2019) for Routine Follow Up, w/ Dr. Burt Knack.   Medication Adjustments/Labs and Tests Ordered: Current medicines are reviewed at length with the patient today.  Concerns regarding medicines are outlined above.  Tests Ordered: Orders Placed This Encounter  Procedures  . Lipid Profile  . Hepatic function panel  . EKG 12-Lead   Medication Changes: No orders of the defined types were placed in this encounter.   Signed, Richardson Dopp, PA-C  06/27/2018 10:35 AM    [facility]  [address], [place], [place]  [address] Phone: [phone]; Fax: [phone]    

## 2018-06-27 NOTE — Patient Instructions (Addendum)
Medication Instructions:  1. Your physician recommends that you continue on your current medications as directed. Please refer to the Current Medication list given to you today.  If you need a refill on your cardiac medications before your next appointment, please call your pharmacy.   Lab work: TODAY FASTING LIPID AND LIVER PANEL If you have labs (blood work) drawn today and your tests are completely normal, you will receive your results only by: Marland Kitchen MyChart Message (if you have MyChart) OR . A paper copy in the mail If you have any lab test that is abnormal or we need to change your treatment, we will call you to review the results.  Testing/Procedures: NONE ORDERED TODAY  Follow-Up: At [facility], you and your health needs are our priority.  As part of our continuing mission to provide you with exceptional heart care, we have created designated Provider Care Teams.  These Care Teams include your primary Cardiologist (physician) and Advanced Practice Providers (APPs -  Physician Assistants and Nurse Practitioners) who all work together to provide you with the care you need, when you need it. . DR. Burt Knack MAY 2020; WE WILL SEND OUT A REMINDER LETTER 2 MONTHS EARLIER   Any Other Special Instructions Will Be Listed Below (If Applicable). CALL IF YOUR COUGH CONTINUES [phone]

## 2018-06-27 NOTE — Telephone Encounter (Signed)
-----   Message from Liliane Shi, [place] sent at 06/27/2018  5:14 PM EDT ----- LDL at goal.  Triglycerides mildly elevated.  LFTs normal. Recommendations:  - Continue current medications and follow up as planned.  Richardson Dopp, PA-C    06/27/2018 5:12 PM

## 2018-07-19 ENCOUNTER — Other Ambulatory Visit: Payer: Self-pay | Admitting: Family Medicine

## 2018-07-26 ENCOUNTER — Encounter: Payer: Self-pay | Admitting: Family Medicine

## 2018-07-26 ENCOUNTER — Ambulatory Visit (INDEPENDENT_AMBULATORY_CARE_PROVIDER_SITE_OTHER): Payer: Medicare Other | Admitting: Family Medicine

## 2018-07-26 VITALS — BP 120/70 | HR 68 | Temp 97.6°F | Resp 12 | Ht 63.0 in | Wt 227.0 lb

## 2018-07-26 DIAGNOSIS — I2510 Atherosclerotic heart disease of native coronary artery without angina pectoris: Secondary | ICD-10-CM

## 2018-07-26 DIAGNOSIS — E785 Hyperlipidemia, unspecified: Secondary | ICD-10-CM | POA: Diagnosis not present

## 2018-07-26 DIAGNOSIS — I10 Essential (primary) hypertension: Secondary | ICD-10-CM

## 2018-07-26 DIAGNOSIS — R944 Abnormal results of kidney function studies: Secondary | ICD-10-CM | POA: Diagnosis not present

## 2018-07-26 DIAGNOSIS — R739 Hyperglycemia, unspecified: Secondary | ICD-10-CM

## 2018-07-26 DIAGNOSIS — Z23 Encounter for immunization: Secondary | ICD-10-CM | POA: Diagnosis not present

## 2018-07-26 LAB — BASIC METABOLIC PANEL
BUN: 22 mg/dL (ref 6–23)
CALCIUM: 9.4 mg/dL (ref 8.4–10.5)
CO2: 29 meq/L (ref 19–32)
Chloride: 100 mEq/L (ref 96–112)
Creatinine, Ser: 1.07 mg/dL (ref 0.40–1.20)
GFR: 53.8 mL/min — AB (ref >60.00)
Glucose, Bld: 147 mg/dL — ABNORMAL HIGH (ref 70–99)
Potassium: 4.2 mEq/L (ref 3.5–5.1)
SODIUM: 138 meq/L (ref 135–145)

## 2018-07-26 LAB — MICROALBUMIN / CREATININE URINE RATIO
CREATININE, U: 163 mg/dL
MICROALB UR: 0.9 mg/dL (ref 0.0–1.9)
MICROALB/CREAT RATIO: 0.6 mg/g (ref 0.0–30.0)

## 2018-07-26 LAB — HEMOGLOBIN A1C: Hgb A1c MFr Bld: 6.6 % — ABNORMAL HIGH (ref 4.6–6.5)

## 2018-07-26 NOTE — Assessment & Plan Note (Addendum)
LDL is at goal. Slightly elevated triglycerides, rest of the numbers in normal range. No changes in pravastatin 40 mg daily. Low-fat diet also recommended.

## 2018-07-26 NOTE — Progress Notes (Signed)
HPI:   [name] is a 72 y.o. female, who is here today for 12 months follow up.   Since her last OV she has follow with cardiologist, Dr. Burt Wong.  Last cardiology follow-up 06/27/2017, she saw Mr. Miranda Wong.  Hypertension:  Currently on lisinopril 10 mg twice daily, HCTZ 25 mg daily, carvedilol 6.25 mg twice daily. History of CAD.  She is on Aspirin 81 mg daily. In 07/2017 e GFR was 59.7.  She is taking medications as instructed most of the time,occasionally she forgets 2nd dose of Lisinopril. No side effects reported.  She has not noted unusual headache, visual changes, exertional chest pain, dyspnea,  focal weakness, or edema.   Lab Results  Component Value Date   CREATININE 0.98 07/27/2017   BUN 21 07/27/2017   NA 137 07/27/2017   K 3.7 07/27/2017   CL 103 07/27/2017   CO2 26 07/27/2017     Hyperlipidemia: Currently on pravastatin 40 mg daily. Following a low fat diet: She tries to do so.  She has not noted side effects with medication.  Lab Results  Component Value Date   CHOL 147 06/27/2018   HDL 50 06/27/2018   LDLCALC 67 06/27/2018   TRIG 152 (H) 06/27/2018   CHOLHDL 2.9 06/27/2018    Glucose 138 in 07/2016.  She walks her dog daily, about a block. She has not been consistent with a healthy diet, she does not like "greens."    Review of Systems  Constitutional: Negative for activity change, appetite change, fatigue and fever.  HENT: Negative for mouth sores, nosebleeds and trouble swallowing.   Eyes: Negative for redness and visual disturbance.  Respiratory: Negative for cough, shortness of breath and wheezing.   Cardiovascular: Negative for chest pain, palpitations and leg swelling.  Gastrointestinal: Negative for abdominal pain, nausea and vomiting.       Negative for changes in bowel habits.  Endocrine: Negative for polydipsia, polyphagia and polyuria.  Genitourinary: Negative for decreased urine volume, difficulty urinating,  dysuria and hematuria.  Musculoskeletal: Positive for arthralgias. Negative for gait problem and myalgias.  Allergic/Immunologic: Positive for environmental allergies.  Neurological: Negative for syncope, weakness and headaches.    Current Outpatient Medications on File Prior to Visit  Medication Sig Dispense Refill  . acetaminophen (TYLENOL) 325 MG tablet Take 650 mg by mouth every 6 (six) hours as needed for headache.    Marland Kitchen aspirin EC 81 MG EC tablet Take 1 tablet (81 mg total) by mouth daily.    Marland Kitchen b complex vitamins tablet Take 1 tablet by mouth every other day.    . carvedilol (COREG) 6.25 MG tablet Take 1 tablet (6.25 mg total) 2 (two) times daily with a meal by mouth. 180 tablet 3  . fluticasone (FLONASE) 50 MCG/ACT nasal spray Place 2 sprays into both nostrils as needed for allergies or rhinitis.    . hydrochlorothiazide (HYDRODIURIL) 25 MG tablet Take one tablet by mouth on Mondays and Thursdays. Take one-half tablet by mouth on other days. 60 tablet 3  . hydroxypropyl methylcellulose / hypromellose (ISOPTO TEARS / GONIOVISC) 2.5 % ophthalmic solution Place 1 drop into both eyes as needed for dry eyes.    Marland Kitchen lisinopril (PRINIVIL,ZESTRIL) 10 MG tablet Take 1 tablet (10 mg total) by mouth 2 (two) times daily. 180 tablet 2  . nitroGLYCERIN (NITROSTAT) 0.4 MG SL tablet Place 1 tablet (0.4 mg total) under the tongue every 5 (five) minutes x 3 doses as needed for chest  pain. 25 tablet 3  . pravastatin (PRAVACHOL) 40 MG tablet Take 1 tablet (40 mg total) by mouth every evening. 90 tablet 3   No current facility-administered medications on file prior to visit.      Past Medical History:  Diagnosis Date  . Allergy   . CAD (coronary artery disease)    cath 06/02/2015 25% mid RCA, 25% prox LCx, 100% prox LAD treated with DES (3.0 x 15 mm Xience), EF 40%  . Chicken pox   . Former smoker   . Heart disease   . Hyperlipidemia   . Hypertension   . Mild ichemic cardiomyopathy, EF 45-50% post  cath 06/2015 06/04/2015  . NSTEMI (non-ST elevated myocardial infarction) ([place]) 06/02/2015   No Known Allergies  Social History   Socioeconomic History  . Marital status: Widowed    Spouse name: Not on file  . Number of children: Not on file  . Years of education: Not on file  . Highest education level: Not on file  Occupational History  . Not on file  Social Needs  . Financial resource strain: Not on file  . Food insecurity:    Worry: Not on file    Inability: Not on file  . Transportation needs:    Medical: Not on file    Non-medical: Not on file  Tobacco Use  . Smoking status: Former Research scientist (life sciences)  . Smokeless tobacco: Never Used  . Tobacco comment: quit when she was 30s  Substance and Sexual Activity  . Alcohol use: No    Alcohol/week: 0.0 standard drinks  . Drug use: No  . Sexual activity: Yes    Birth control/protection: None  Lifestyle  . Physical activity:    Days per week: Not on file    Minutes per session: Not on file  . Stress: Not on file  Relationships  . Social connections:    Talks on phone: Not on file    Gets together: Not on file    Attends religious service: Not on file    Active member of club or organization: Not on file    Attends meetings of clubs or organizations: Not on file    Relationship status: Not on file  Other Topics Concern  . Not on file  Social History Narrative  . Not on file    Vitals:   07/26/18 1012  BP: 120/70  Pulse: 68  Resp: 12  Temp: 97.6 F (36.4 C)  SpO2: 97%   Body mass index is 40.21 kg/m. Wt Readings from Last 3 Encounters:  07/26/18 227 lb (103 kg)  06/27/18 225 lb 12.8 oz (102.4 kg)  01/10/18 227 lb (103 kg)    Physical Exam  Nursing note and vitals reviewed. Constitutional: She is oriented to person, place, and time. She appears well-developed. No distress.  HENT:  Head: Normocephalic and atraumatic.  Mouth/Throat: Oropharynx is clear and moist and mucous membranes are normal. She has dentures.    Eyes: Pupils are equal, round, and reactive to light. Conjunctivae are normal.  Cardiovascular: Normal rate and regular rhythm.  No murmur heard. Pulses:      Dorsalis pedis pulses are 2+ on the right side, and 2+ on the left side.  Respiratory: Effort normal and breath sounds normal. No respiratory distress.  GI: Soft. She exhibits no mass. There is no hepatomegaly. There is no tenderness.  Musculoskeletal: She exhibits no edema.  Lymphadenopathy:    She has no cervical adenopathy.  Neurological: She is alert and oriented  to person, place, and time. She has normal strength. No cranial nerve deficit. Gait normal.  Skin: Skin is warm. No rash noted. No erythema.  Psychiatric: She has a normal mood and affect.  Well groomed, good eye contact.       ASSESSMENT AND PLAN:   Ms. Miranda Wong was seen today for 12 months follow-up.  Orders Placed This Encounter  Procedures  . Basic metabolic panel  . Microalbumin / creatinine urine ratio  . Hemoglobin A1c   Lab Results  Component Value Date   CREATININE 1.07 07/26/2018   BUN 22 07/26/2018   NA 138 07/26/2018   K 4.2 07/26/2018   CL 100 07/26/2018   CO2 29 07/26/2018   Lab Results  Component Value Date   HGBA1C 6.6 (H) 07/26/2018   Lab Results  Component Value Date   MICROALBUR 0.9 07/26/2018    Hyperlipidemia LDL is at goal. Slightly elevated triglycerides, rest of the numbers in normal range. No changes in pravastatin 40 mg daily. Low-fat diet also recommended.    Essential hypertension BP adequately controlled. No changes in current management. She is following with Dr. Annice Pih. waiver ([place]) twice per year. She is overdue for eye exam, recommend arrange an appointment. Continue low-salt diet. I will see her back in a year.  Obesity, morbid, BMI 40.0-49.9 ([place]) We discussed benefits of wt loss as well as adverse effects of obesity. Consistency with healthy diet and physical activity  recommended.   Coronary artery disease involving native coronary artery of native heart without angina pectoris Asymptomatic. Continue pravastatin and Aspirin. She is following with cardiologist twice per year.  Abnormal renal function test Recommend continue low-salt diet. Adequate hydration. Avoid NSAIDs. Adequate BP control. No changes in lisinopril.  -     Basic metabolic panel -     Microalbumin / creatinine urine ratio  High blood sugar She had a fasting glucose a year ago elevated, 138. Further recommendation will be given according to lab results. Healthy lifestyle recommended for primary prevention.  -     Hemoglobin A1c       Kavan Devan G. [place], MD  [facility]. [place] office.

## 2018-07-26 NOTE — Assessment & Plan Note (Signed)
We discussed benefits of wt loss as well as adverse effects of obesity. Consistency with healthy diet and physical activity recommended.  

## 2018-07-26 NOTE — Assessment & Plan Note (Signed)
Asymptomatic. Continue pravastatin and Aspirin. She is following with cardiologist twice per year.

## 2018-07-26 NOTE — Patient Instructions (Addendum)
A few things to remember from today's visit: A few tips:  -As we age balance is not as good as it was, so there is a higher risks for falls. Please remove small rugs and furniture that is "in your way" and could increase the risk of falls. Stretching exercises may help with fall prevention: Yoga and Tai Chi are some examples. Low impact exercise is better, so you are not very achy the next day.  -Sun screen and avoidance of direct sun light recommended. Caution with dehydration, if working outdoors be sure to drink enough fluids.  - Some medications are not safe as we age, increases the risk of side effects and can potentially interact with other medication you are also taken;  including some of over the counter medications. Be sure to let me know when you start a new medication even if it is a dietary/vitamin supplement.   -Healthy diet low in red meet/animal fat and sugar + regular physical activity is recommended.       Hyperglycemia  Essential hypertension - Plan: Basic metabolic panel  Hyperlipidemia, unspecified hyperlipidemia type  Abnormal renal function test - Plan: Basic metabolic panel, Microalbumin / creatinine urine ratio  High blood sugar - Plan: Hemoglobin A1c   Please be sure medication list is accurate. If a new problem present, please set up appointment sooner than planned today.

## 2018-07-26 NOTE — Assessment & Plan Note (Addendum)
BP adequately controlled. No changes in current management. She is following with Dr. Annice Pih. waiver ([place]) twice per year. She is overdue for eye exam, recommend arrange an appointment. Continue low-salt diet. I will see her back in a year.

## 2018-07-30 ENCOUNTER — Encounter: Payer: Self-pay | Admitting: Family Medicine

## 2018-08-19 ENCOUNTER — Other Ambulatory Visit: Payer: Self-pay | Admitting: Family Medicine

## 2018-08-19 DIAGNOSIS — Z1231 Encounter for screening mammogram for malignant neoplasm of breast: Secondary | ICD-10-CM

## 2018-08-23 ENCOUNTER — Other Ambulatory Visit: Payer: Self-pay | Admitting: Physician Assistant

## 2018-08-23 DIAGNOSIS — I10 Essential (primary) hypertension: Secondary | ICD-10-CM

## 2018-08-23 DIAGNOSIS — I255 Ischemic cardiomyopathy: Secondary | ICD-10-CM

## 2018-08-23 DIAGNOSIS — I2510 Atherosclerotic heart disease of native coronary artery without angina pectoris: Secondary | ICD-10-CM

## 2018-09-13 ENCOUNTER — Ambulatory Visit
Admission: RE | Admit: 2018-09-13 | Discharge: 2018-09-13 | Disposition: A | Discharge status: Home / Self Care | Payer: Medicare Other | Source: Ambulatory Visit | Attending: Family Medicine | Admitting: Family Medicine

## 2018-09-13 DIAGNOSIS — Z1231 Encounter for screening mammogram for malignant neoplasm of breast: Secondary | ICD-10-CM | POA: Diagnosis not present

## 2018-10-07 ENCOUNTER — Telehealth: Payer: Self-pay | Admitting: Family Medicine

## 2018-10-07 NOTE — Telephone Encounter (Signed)
Spoke with Miranda Wong regarding AWV. Patient stated she would like a call back in NOV 2020 to schedule wellness visit. SF

## 2018-11-23 ENCOUNTER — Other Ambulatory Visit: Payer: Self-pay | Admitting: Physician Assistant

## 2018-11-23 DIAGNOSIS — I2510 Atherosclerotic heart disease of native coronary artery without angina pectoris: Secondary | ICD-10-CM

## 2019-04-18 DIAGNOSIS — H2513 Age-related nuclear cataract, bilateral: Secondary | ICD-10-CM | POA: Diagnosis not present

## 2019-05-21 ENCOUNTER — Other Ambulatory Visit: Payer: Self-pay | Admitting: Physician Assistant

## 2019-05-21 DIAGNOSIS — I10 Essential (primary) hypertension: Secondary | ICD-10-CM

## 2019-05-21 DIAGNOSIS — I2510 Atherosclerotic heart disease of native coronary artery without angina pectoris: Secondary | ICD-10-CM

## 2019-05-26 ENCOUNTER — Other Ambulatory Visit: Payer: Self-pay | Admitting: Physician Assistant

## 2019-05-26 DIAGNOSIS — I2510 Atherosclerotic heart disease of native coronary artery without angina pectoris: Secondary | ICD-10-CM

## 2019-05-26 DIAGNOSIS — I10 Essential (primary) hypertension: Secondary | ICD-10-CM

## 2019-05-27 DIAGNOSIS — Z23 Encounter for immunization: Secondary | ICD-10-CM | POA: Diagnosis not present

## 2019-06-18 ENCOUNTER — Other Ambulatory Visit: Payer: Self-pay | Admitting: Physician Assistant

## 2019-06-18 DIAGNOSIS — I2510 Atherosclerotic heart disease of native coronary artery without angina pectoris: Secondary | ICD-10-CM

## 2019-06-21 ENCOUNTER — Encounter: Payer: Self-pay | Admitting: Physician Assistant

## 2019-06-21 ENCOUNTER — Ambulatory Visit (INDEPENDENT_AMBULATORY_CARE_PROVIDER_SITE_OTHER): Payer: Medicare Other | Admitting: Physician Assistant

## 2019-06-21 ENCOUNTER — Other Ambulatory Visit: Payer: Self-pay

## 2019-06-21 VITALS — BP 108/68 | HR 69 | Ht 63.0 in | Wt 221.0 lb

## 2019-06-21 DIAGNOSIS — I10 Essential (primary) hypertension: Secondary | ICD-10-CM | POA: Diagnosis not present

## 2019-06-21 DIAGNOSIS — E785 Hyperlipidemia, unspecified: Secondary | ICD-10-CM | POA: Diagnosis not present

## 2019-06-21 DIAGNOSIS — I2510 Atherosclerotic heart disease of native coronary artery without angina pectoris: Secondary | ICD-10-CM

## 2019-06-21 MED ORDER — HYDROCHLOROTHIAZIDE 25 MG PO TABS: ORAL_TABLET | ORAL | 3 refills | Status: DC

## 2019-06-21 MED ORDER — LISINOPRIL 10 MG PO TABS: 10.0000 mg | ORAL_TABLET | Freq: Every day | ORAL | 3 refills | Status: DC

## 2019-06-21 NOTE — Patient Instructions (Addendum)
Medication Instructions:  Your physician has recommended you make the following change in your medication: 1. Decrease lisinopril to one tablet (10 mg) daily, sent in to requested pharmacy # 90. 2. Sent in HCTZ today # 90.  *If you need a refill on your cardiac medications before your next appointment, please call your pharmacy*  Lab Work: Your physician recommends that you have lab work today: CMET/LIPID  If you have labs (blood work) drawn today and your tests are completely normal, you will receive your results only by: Marland Kitchen MyChart Message (if you have MyChart) OR . A paper copy in the mail If you have any lab test that is abnormal or we need to change your treatment, we will call you to review the results.  Testing/Procedures: -None  Follow-Up: At [facility], you and your health needs are our priority.  As part of our continuing mission to provide you with exceptional heart care, we have created designated Provider Care Teams.  These Care Teams include your primary Cardiologist (physician) and Advanced Practice Providers (APPs -  Physician Assistants and Nurse Practitioners) who all work together to provide you with the care you need, when you need it.  Your next appointment:   12 months  The format for your next appointment:   In Person  Provider:   You may see Sherren Mocha, MD or one of the following Advanced Practice Providers on your designated Care Team:    Richardson Dopp, PA-C  [place], [place]  Daune Perch, NP   Other Instructions Get a blood pressure cuff, check bp daily X 2-3 weeks, send in readings to Richardson Dopp, PA-C either [employer] or mail.

## 2019-06-21 NOTE — Progress Notes (Signed)
Cardiology Office Note:    Date:  06/21/2019   ID:  Miranda Wong, Miranda Wong 1946-05-29, MRN [id number]  PCP:  Miranda Wong, Miranda G, MD  Cardiologist:  Sherren Mocha, MD  Electrophysiologist:  None   Referring MD: Miranda Wong, Miranda G, MD   Chief Complaint  Patient presents with  . Follow-up    CAD    History of Present Illness:    Miranda Wong is a 73 y.o. female with:   Coronary artery disease   S/p anterior NSTEMI in 10/16 >> PCI: DES to LAD  Ischemic CM   EF 45-50 post MI in 2016  EF 2/17: EF 65-70  Hypertension   Hyperlipidemia   Intol of Atorva (myalgias); Rosuva (cost)  Miranda Wong was last seen in 05/2018.  She returns for follow-up.  She is here alone.  She has not had any chest discomfort or significant shortness of breath.  She has not had orthopnea, significant lower extremity swelling or syncope.  She does have a dry, nonproductive cough.  This seems to be caused by lisinopril.   Prior CV studies:   The following studies were reviewed today:  Echo 10/10/15 Vigorous LVF, EF 65-70%, normal wall motion, grade 1 diastolic dysfunction, normal RVSF, mild TR, PASP 38 mmHg  Echo 06/03/15 Mild LVH, EF 45-50%, anteroseptal HK  LHC 06/02/15 LAD: Proximal 100% >>PCI: 3 x 15 mm Xience DES LCx: Proximal 25% RCA: Mid 25% EF 35-45%  Past Medical History:  Diagnosis Date  . Allergy   . CAD (coronary artery disease)    cath 06/02/2015 25% mid RCA, 25% prox LCx, 100% prox LAD treated with DES (3.0 x 15 mm Xience), EF 40%  . Chicken pox   . Former smoker   . Heart disease   . Hyperlipidemia   . Hypertension   . Mild ichemic cardiomyopathy, EF 45-50% post cath 06/2015 06/04/2015  . NSTEMI (non-ST elevated myocardial infarction) ([place]) 06/02/2015   Surgical Hx: The patient  has a past surgical history that includes Cholecystectomy; Cardiac catheterization (N/A, 06/02/2015); Gallbladder surgery; and Tubal ligation.   Current Medications: Current Meds  Medication Sig   . acetaminophen (TYLENOL) 325 MG tablet Take 650 mg by mouth every 6 (six) hours as needed for headache.  Marland Kitchen aspirin EC 81 MG EC tablet Take 1 tablet (81 mg total) by mouth daily.  Marland Kitchen b complex vitamins tablet Take 1 tablet by mouth every other day.  . carvedilol (COREG) 6.25 MG tablet TAKE 1 TABLET 2TIMES DAILY WITH A MEAL BY MOUTH.  . fluticasone (FLONASE) 50 MCG/ACT nasal spray Place 2 sprays into both nostrils as needed for allergies or rhinitis.  . hydroxypropyl methylcellulose / hypromellose (ISOPTO TEARS / GONIOVISC) 2.5 % ophthalmic solution Place 1 drop into both eyes as needed for dry eyes.  . nitroGLYCERIN (NITROSTAT) 0.4 MG SL tablet Place 1 tablet (0.4 mg total) under the tongue every 5 (five) minutes x 3 doses as needed for chest pain.  . pravastatin (PRAVACHOL) 40 MG tablet Take 1 tablet (40 mg total) by mouth every evening.  . [DISCONTINUED] hydrochlorothiazide (HYDRODIURIL) 25 MG tablet TAKE 1 TABLET BY MOUTH ON MONDAY/THURSDAY AND TAKE 1/2 TAB ON TUE/WED/FRID/SAT/SUN. Pt needs to make appt to get 90 days  . [DISCONTINUED] lisinopril (ZESTRIL) 10 MG tablet Take 1 tablet (10 mg total) by mouth 2 (two) times daily. Please make yearly appt with Dr. Burt Knack for October before anymore refills. 1st attempt     Allergies:   Patient has no known  allergies.   Social History   Tobacco Use  . Smoking status: Former Research scientist (life sciences)  . Smokeless tobacco: Never Used  . Tobacco comment: quit when she was 30s  Substance Use Topics  . Alcohol use: No    Alcohol/week: 0.0 standard drinks  . Drug use: No     Family Hx: The patient's family history includes Breast cancer in her mother; Heart attack in her father; Valvular heart disease in her mother.  ROS:   Please see the history of present illness.    ROS All other systems reviewed and are negative.   EKGs/Labs/Other Test Reviewed:    EKG:  EKG is  ordered today.  The ekg ordered today demonstrates normal sinus rhythm, HR 69, left axis  deviation, septal Q waves, PAC, QTC 439, no change from prior tracing  Recent Labs: 06/27/2018: ALT 29 07/26/2018: BUN 22; Creatinine, Ser 1.07; Potassium 4.2; Sodium 138   Recent Lipid Panel Lab Results  Component Value Date/Time   CHOL 147 06/27/2018 10:38 AM   TRIG 152 (H) 06/27/2018 10:38 AM   HDL 50 06/27/2018 10:38 AM   CHOLHDL 2.9 06/27/2018 10:38 AM   CHOLHDL 3 07/27/2017 10:02 AM   LDLCALC 67 06/27/2018 10:38 AM    Physical Exam:    VS:  BP 108/68   Pulse 69   Ht _0  (1.6 m)   Wt 221 lb (100.2 kg)   SpO2 95%   BMI 39.15 kg/m     Wt Readings from Last 3 Encounters:  06/21/19 221 lb (100.2 kg)  07/26/18 227 lb (103 kg)  06/27/18 225 lb 12.8 oz (102.4 kg)     Physical Exam  Constitutional: She is oriented to person, place, and time. She appears well-developed and well-nourished. No distress.  HENT:  Head: Normocephalic and atraumatic.  Neck: Neck supple. No JVD present.  Cardiovascular: Normal rate, regular rhythm, S1 normal, S2 normal and normal heart sounds.  No murmur heard. Pulmonary/Chest: Effort normal. She has no rales.  Abdominal: Soft. There is no hepatomegaly.  Musculoskeletal:        General: No edema.  Neurological: She is alert and oriented to person, place, and time.  Skin: Skin is warm and dry.    ASSESSMENT & PLAN:    1. Coronary artery disease involving native coronary artery of native heart without angina pectoris History of non-ST elevation myocardial infarction October 2016 treated with drug-eluting stent to the LAD.  She is currently doing well without anginal symptoms.  Continue aspirin, statin, beta-blocker.  Obtain fasting CMET, lipids.  2. Essential hypertension Blood pressure is well controlled.  She does note some fatigue or dizziness at nighttime when she takes her carvedilol and lisinopril.  Decrease lisinopril to 10 mg once daily.  I will provide her with a blood pressure cuff.  She should check her blood pressure daily for  the next couple of weeks and send me those readings.  If her pressure remains well controlled, she will continue on lisinopril once daily.  3. Hyperlipidemia, unspecified hyperlipidemia type Continue pravastatin.  Obtain fasting CMET lipids today.   Dispo:  Return in about 1 year (around 06/20/2020) for Routine Follow Up, w/ Dr. Burt Knack, or Richardson Dopp, PA-C, in person.   Medication Adjustments/Labs and Tests Ordered: Current medicines are reviewed at length with the patient today.  Concerns regarding medicines are outlined above.  Tests Ordered: Orders Placed This Encounter  Procedures  . Comp Met (CMET)  . Lipid Profile  . EKG 12-Lead  Medication Changes: Meds ordered this encounter  Medications  . hydrochlorothiazide (HYDRODIURIL) 25 MG tablet    Sig: TAKE 1 TABLET BY MOUTH ON MONDAY/THURSDAY AND TAKE 1/2 TAB ON TUE/WED/FRID/SAT/SUN.    Dispense:  90 tablet    Refill:  3  . lisinopril (ZESTRIL) 10 MG tablet    Sig: Take 1 tablet (10 mg total) by mouth daily.    Dispense:  90 tablet    Refill:  3    Signed, Richardson Dopp, PA-C  06/21/2019 5:26 PM    [facility] [place], [place], [place]  [address] Phone: [phone]; Fax: [phone]

## 2019-06-22 ENCOUNTER — Encounter: Payer: Self-pay | Admitting: Physician Assistant

## 2019-06-22 ENCOUNTER — Telehealth: Payer: Self-pay

## 2019-06-22 ENCOUNTER — Telehealth: Payer: Self-pay | Admitting: Physician Assistant

## 2019-06-22 DIAGNOSIS — E1169 Type 2 diabetes mellitus with other specified complication: Secondary | ICD-10-CM | POA: Insufficient documentation

## 2019-06-22 DIAGNOSIS — E119 Type 2 diabetes mellitus without complications: Secondary | ICD-10-CM

## 2019-06-22 HISTORY — DX: Type 2 diabetes mellitus without complications: E11.9

## 2019-06-22 LAB — COMPREHENSIVE METABOLIC PANEL
ALT: 54 IU/L — ABNORMAL HIGH (ref 0–32)
AST: 53 IU/L — ABNORMAL HIGH (ref 0–40)
Albumin/Globulin Ratio: 1.6 (ref 1.2–2.2)
Albumin: 4.4 g/dL (ref 3.7–4.7)
Alkaline Phosphatase: 107 IU/L (ref 39–117)
BUN/Creatinine Ratio: 15 (ref 12–28)
BUN: 17 mg/dL (ref 8–27)
Bilirubin Total: 0.7 mg/dL (ref 0.0–1.2)
CO2: 25 mmol/L (ref 20–29)
Calcium: 9.8 mg/dL (ref 8.7–10.3)
Chloride: 100 mmol/L (ref 96–106)
Creatinine, Ser: 1.12 mg/dL — ABNORMAL HIGH (ref 0.57–1.00)
GFR calc Af Amer: 57 mL/min/{1.73_m2} — ABNORMAL LOW (ref >59)
GFR calc non Af Amer: 50 mL/min/{1.73_m2} — ABNORMAL LOW (ref >59)
Globulin, Total: 2.8 g/dL (ref 1.5–4.5)
Glucose: 152 mg/dL — ABNORMAL HIGH (ref 65–99)
Potassium: 4.5 mmol/L (ref 3.5–5.2)
Sodium: 139 mmol/L (ref 134–144)
Total Protein: 7.2 g/dL (ref 6.0–8.5)

## 2019-06-22 LAB — LIPID PANEL
Chol/HDL Ratio: 3.7 ratio (ref 0.0–4.4)
Cholesterol, Total: 195 mg/dL (ref 100–199)
HDL: 53 mg/dL (ref >39)
LDL Chol Calc (NIH): 109 mg/dL — ABNORMAL HIGH (ref 0–99)
Triglycerides: 193 mg/dL — ABNORMAL HIGH (ref 0–149)
VLDL Cholesterol Cal: 33 mg/dL (ref 5–40)

## 2019-06-22 NOTE — Telephone Encounter (Signed)
Follow Up:     Pt is returning your call, concerning her lab results.

## 2019-06-22 NOTE — Telephone Encounter (Signed)
-----   Message from Liliane Shi, [place] sent at 06/22/2019  9:02 AM EDT ----- Glucose (sugar) is elevated.  Kidney function is mildly weak (Creatinine elevated).  The potassium is normal.  The liver enzymes are mildly elevated (AST, ALT).  The triglycerides are elevated.  The LDL ("bad cholesterol") is too high (goal is less than 70).  The HDL ("good cholesterol") is optimal.   PLAN:   -Decrease HCTZ to 1/2 tab every day  -Start Zetia 10 mg once daily   -If she drinks alcohol at all, limit consumption  -Repeat fasting CMET, Lipids in 8 weeks.   -Follow up with PCP for elevated glucose  Richardson Dopp, PA-C    06/22/2019 8:52 AM

## 2019-06-22 NOTE — Telephone Encounter (Signed)
Notes recorded by Frederik Schmidt, RN on 06/22/2019 at 10:44 AM EDT  [email or web address] 10/22  ------

## 2019-06-23 ENCOUNTER — Telehealth: Payer: Self-pay

## 2019-06-23 DIAGNOSIS — Z79899 Other long term (current) drug therapy: Secondary | ICD-10-CM

## 2019-06-23 DIAGNOSIS — E785 Hyperlipidemia, unspecified: Secondary | ICD-10-CM

## 2019-06-23 DIAGNOSIS — I10 Essential (primary) hypertension: Secondary | ICD-10-CM

## 2019-06-23 DIAGNOSIS — I2510 Atherosclerotic heart disease of native coronary artery without angina pectoris: Secondary | ICD-10-CM

## 2019-06-23 DIAGNOSIS — E781 Pure hyperglyceridemia: Secondary | ICD-10-CM

## 2019-06-23 MED ORDER — HYDROCHLOROTHIAZIDE 25 MG PO TABS: ORAL_TABLET | ORAL | 3 refills | Status: DC

## 2019-06-23 MED ORDER — EZETIMIBE 10 MG PO TABS: 10.0000 mg | ORAL_TABLET | Freq: Every day | ORAL | 3 refills | Status: DC

## 2019-06-23 NOTE — Telephone Encounter (Signed)
Pt verbalized understanding of her lab results.... will return 08/21/19 for repeat fasting labs.

## 2019-06-23 NOTE — Telephone Encounter (Signed)
-----   Message from Liliane Shi, [place] sent at 06/22/2019  9:02 AM EDT ----- Glucose (sugar) is elevated.  Kidney function is mildly weak (Creatinine elevated).  The potassium is normal.  The liver enzymes are mildly elevated (AST, ALT).  The triglycerides are elevated.  The LDL ("bad cholesterol") is too high (goal is less than 70).  The HDL ("good cholesterol") is optimal.   PLAN:   -Decrease HCTZ to 1/2 tab every day  -Start Zetia 10 mg once daily   -If she drinks alcohol at all, limit consumption  -Repeat fasting CMET, Lipids in 8 weeks.   -Follow up with PCP for elevated glucose  Richardson Dopp, PA-C    06/22/2019 8:52 AM

## 2019-06-23 NOTE — Telephone Encounter (Signed)
Notes recorded by Frederik Schmidt, RN on 06/23/2019 at 8:13 AM EDT  lpmtcb 10/23

## 2019-08-21 ENCOUNTER — Other Ambulatory Visit: Payer: Self-pay

## 2019-08-21 ENCOUNTER — Other Ambulatory Visit: Payer: Medicare Other | Admitting: *Deleted

## 2019-08-21 DIAGNOSIS — Z79899 Other long term (current) drug therapy: Secondary | ICD-10-CM

## 2019-08-21 DIAGNOSIS — E785 Hyperlipidemia, unspecified: Secondary | ICD-10-CM

## 2019-08-21 DIAGNOSIS — I2510 Atherosclerotic heart disease of native coronary artery without angina pectoris: Secondary | ICD-10-CM

## 2019-08-21 DIAGNOSIS — E781 Pure hyperglyceridemia: Secondary | ICD-10-CM

## 2019-08-21 DIAGNOSIS — I10 Essential (primary) hypertension: Secondary | ICD-10-CM

## 2019-08-22 ENCOUNTER — Telehealth: Payer: Self-pay

## 2019-08-22 ENCOUNTER — Other Ambulatory Visit: Payer: Self-pay

## 2019-08-22 DIAGNOSIS — I10 Essential (primary) hypertension: Secondary | ICD-10-CM

## 2019-08-22 LAB — HEPATIC FUNCTION PANEL
ALT: 48 IU/L — ABNORMAL HIGH (ref 0–32)
AST: 67 IU/L — ABNORMAL HIGH (ref 0–40)
Albumin: 4 g/dL (ref 3.7–4.7)
Alkaline Phosphatase: 87 IU/L (ref 39–117)
Bilirubin Total: 0.7 mg/dL (ref 0.0–1.2)
Bilirubin, Direct: 0.27 mg/dL (ref 0.00–0.40)
Total Protein: 6.4 g/dL (ref 6.0–8.5)

## 2019-08-22 LAB — LIPID PANEL
Chol/HDL Ratio: 2.7 ratio (ref 0.0–4.4)
Cholesterol, Total: 132 mg/dL (ref 100–199)
HDL: 49 mg/dL (ref >39)
LDL Chol Calc (NIH): 59 mg/dL (ref 0–99)
Triglycerides: 140 mg/dL (ref 0–149)
VLDL Cholesterol Cal: 24 mg/dL (ref 5–40)

## 2019-08-22 NOTE — Telephone Encounter (Signed)
-----   Message from Liliane Shi, [place] sent at 08/22/2019 12:34 PM EST ----- Lipids improved/optimal.  LFTs mildly elevated. She was supposed to have a follow up CMET, not just LFTs. PLAN:   - Continue current medications.  - Follow up with PCP for elevated LFTs - send copy of labs to PCP.  - Can we see if the lab can add on a BMET to the blood in the labs? Richardson Dopp, PA-C    08/22/2019 12:30 PM

## 2019-08-22 NOTE — Telephone Encounter (Signed)
BMET labs being added on by lab, results to Richardson Dopp, [place].  LFT results routed to Dr. Betty Martinique.    Spoke with pt regarding results, questions answered.  Patient concerned with kidney function and glucose level, advised those results are pending as lab is only now running them.    Patient states that she is having hip and leg pain similar to what she had on Lipitor but "not as bad"  The pain started when she began Zetia and has been improving but is still there if she stands for a prolonged period of time.  Advised I would notify provider and if any changes contact her back.

## 2019-08-22 NOTE — Telephone Encounter (Signed)
Hold Pravastatin for 1 week. Call if pain significantly improved. If no change, resume Pravastatin. Richardson Dopp, PA-C    08/22/2019 1:16 PM

## 2019-08-22 NOTE — Telephone Encounter (Signed)
Spoke with patient.  She will hold Pravastatin for a week.  Report back if improvement in pain in the hip, otherwise if no improvement, resume pravastatin.

## 2019-08-24 LAB — BASIC METABOLIC PANEL
BUN/Creatinine Ratio: 15 (ref 12–28)
BUN: 14 mg/dL (ref 8–27)
CO2: 19 mmol/L — ABNORMAL LOW (ref 20–29)
Calcium: 9.5 mg/dL (ref 8.7–10.3)
Chloride: 104 mmol/L (ref 96–106)
Creatinine, Ser: 0.96 mg/dL (ref 0.57–1.00)
GFR calc Af Amer: 69 mL/min/{1.73_m2} (ref >59)
GFR calc non Af Amer: 60 mL/min/{1.73_m2} (ref >59)
Glucose: 135 mg/dL — ABNORMAL HIGH (ref 65–99)
Potassium: 4.1 mmol/L (ref 3.5–5.2)
Sodium: 141 mmol/L (ref 134–144)

## 2019-08-24 LAB — SPECIMEN STATUS REPORT

## 2019-08-30 ENCOUNTER — Other Ambulatory Visit: Payer: Self-pay

## 2019-08-30 ENCOUNTER — Ambulatory Visit (INDEPENDENT_AMBULATORY_CARE_PROVIDER_SITE_OTHER): Payer: Medicare Other | Admitting: Family Medicine

## 2019-08-30 ENCOUNTER — Encounter: Payer: Self-pay | Admitting: Family Medicine

## 2019-08-30 VITALS — BP 130/70 | HR 71 | Resp 16 | Ht 63.0 in | Wt 217.0 lb

## 2019-08-30 DIAGNOSIS — I10 Essential (primary) hypertension: Secondary | ICD-10-CM | POA: Diagnosis not present

## 2019-08-30 DIAGNOSIS — M791 Myalgia, unspecified site: Secondary | ICD-10-CM | POA: Diagnosis not present

## 2019-08-30 DIAGNOSIS — I2510 Atherosclerotic heart disease of native coronary artery without angina pectoris: Secondary | ICD-10-CM

## 2019-08-30 DIAGNOSIS — Z0000 Encounter for general adult medical examination without abnormal findings: Secondary | ICD-10-CM

## 2019-08-30 DIAGNOSIS — E1169 Type 2 diabetes mellitus with other specified complication: Secondary | ICD-10-CM | POA: Diagnosis not present

## 2019-08-30 DIAGNOSIS — Z23 Encounter for immunization: Secondary | ICD-10-CM | POA: Diagnosis not present

## 2019-08-30 DIAGNOSIS — T466X5A Adverse effect of antihyperlipidemic and antiarteriosclerotic drugs, initial encounter: Secondary | ICD-10-CM

## 2019-08-30 DIAGNOSIS — Z780 Asymptomatic menopausal state: Secondary | ICD-10-CM

## 2019-08-30 DIAGNOSIS — G720 Drug-induced myopathy: Secondary | ICD-10-CM | POA: Insufficient documentation

## 2019-08-30 LAB — MICROALBUMIN / CREATININE URINE RATIO
Creatinine,U: 145.2 mg/dL
Microalb Creat Ratio: 0.5 mg/g (ref 0.0–30.0)
Microalb, Ur: 0.7 mg/dL (ref 0.0–1.9)

## 2019-08-30 LAB — POCT GLYCOSYLATED HEMOGLOBIN (HGB A1C): Hemoglobin A1C: 6.2 % — AB (ref 4.0–5.6)

## 2019-08-30 NOTE — Progress Notes (Signed)
HPI:   [name] is a 73 y.o. female, who is here today for AWV and chronic disease management.  She was last seen on 07/26/18. Last AWV on 07/27/17.  She is now living alone. Her mother passed away in Dec 17, 2018. Independent ADL's and IADL's. No falls in the past year and denies depression symptoms.  Functional Status Survey: Is the patient deaf or have difficulty hearing?: No Does the patient have difficulty seeing, even when wearing glasses/contacts?: No Does the patient have difficulty concentrating, remembering, or making decisions?: No Does the patient have difficulty walking or climbing stairs?: No Does the patient have difficulty dressing or bathing?: No  Fall Risk  08/30/2019 07/26/2018 07/27/2017  Falls in the past year? 0 0 No  Number falls in past yr: 0 0 -  Injury with Fall? 0 0 -  Follow up Education provided Education provided -   Providers she sees regularly: Eye care provider: Erline Levine Hutto,optometrist. Cardiologist: Richardson Dopp, PA  Depression screen [facility] 2/9 08/30/2019  Decreased Interest 0  Down, Depressed, Hopeless 0  PHQ - 2 Score 0     Hearing Screening   125Hz  250Hz  500Hz  1000Hz  2000Hz  3000Hz  4000Hz  6000Hz  8000Hz   Right ear:           Left ear:           Vision Screening Comments: Refused. Last eye exam 04/2019.  DM II: Dx'ed in 07/2018. Denies polydipsia,polyuria, or polyphagia.  Lab Results  Component Value Date   HGBA1C 6.6 (H) 07/26/2018   HLD: Pravastatin stopped a week ago and achiness improved, not resolved.  Lab Results  Component Value Date   CHOL 132 08/21/2019   HDL 49 08/21/2019   LDLCALC 59 08/21/2019   TRIG 140 08/21/2019   CHOLHDL 2.7 08/21/2019   HTN and CAD: She is on Coreg 6.25 mg bid,Lisinopril 10 mg daily,and HCTZ 25 mg daily. Denies severe/frequent headache, visual changes, chest pain, dyspnea, palpitation, claudication, focal weakness, or edema.  She is following a healthier diet and walking her  dog daily. She has noted some wt loss.   Review of Systems  Constitutional: Negative for activity change, appetite change, fatigue, fever and unexpected weight change.  HENT: Negative for mouth sores, nosebleeds and sore throat.   Respiratory: Negative for cough and wheezing.   Gastrointestinal: Negative for abdominal pain, nausea and vomiting.       Negative for changes in bowel habits.  Endocrine: Negative for cold intolerance and heat intolerance.  Genitourinary: Negative for decreased urine volume, dysuria and hematuria.  Skin: Negative for rash and wound.  Allergic/Immunologic: Positive for environmental allergies.  Neurological: Negative for syncope, facial asymmetry and numbness.  Rest of ROS, see pertinent positives sand negatives in HPI   Current Outpatient Medications on File Prior to Visit  Medication Sig Dispense Refill  . acetaminophen (TYLENOL) 325 MG tablet Take 650 mg by mouth every 6 (six) hours as needed for headache.    Marland Kitchen aspirin EC 81 MG EC tablet Take 1 tablet (81 mg total) by mouth daily.    Marland Kitchen b complex vitamins tablet Take 1 tablet by mouth every other day.    . carvedilol (COREG) 6.25 MG tablet TAKE 1 TABLET 2TIMES DAILY WITH A MEAL BY MOUTH. 180 tablet 2  . ezetimibe (ZETIA) 10 MG tablet Take 1 tablet (10 mg total) by mouth daily. 90 tablet 3  . fluticasone (FLONASE) 50 MCG/ACT nasal spray Place 2 sprays into both nostrils as needed  for allergies or rhinitis.    . hydrochlorothiazide (HYDRODIURIL) 25 MG tablet Take 1/2 tablet ( 12.5 mg ) once a day. 90 tablet 3  . hydroxypropyl methylcellulose / hypromellose (ISOPTO TEARS / GONIOVISC) 2.5 % ophthalmic solution Place 1 drop into both eyes as needed for dry eyes.    Marland Kitchen lisinopril (ZESTRIL) 10 MG tablet Take 1 tablet (10 mg total) by mouth daily. 90 tablet 3  . nitroGLYCERIN (NITROSTAT) 0.4 MG SL tablet Place 1 tablet (0.4 mg total) under the tongue every 5 (five) minutes x 3 doses as needed for chest pain. 25 tablet  3  . pravastatin (PRAVACHOL) 40 MG tablet Take 1 tablet (40 mg total) by mouth every evening. 90 tablet 3   No current facility-administered medications on file prior to visit.   Past Medical History:  Diagnosis Date  . Allergy   . CAD (coronary artery disease)    cath 06/02/2015 25% mid RCA, 25% prox LCx, 100% prox LAD treated with DES (3.0 x 15 mm Xience), EF 40%  . Chicken pox   . Former smoker   . Heart disease   . Hyperlipidemia   . Hypertension   . Mild ichemic cardiomyopathy, EF 45-50% post cath 06/2015 06/04/2015  . NSTEMI (non-ST elevated myocardial infarction) ([place]) 06/02/2015  . Type 2 diabetes mellitus without complication ([place]) [id number]   No Known Allergies  Social History   Socioeconomic History  . Marital status: Widowed    Spouse name: Not on file  . Number of children: Not on file  . Years of education: Not on file  . Highest education level: Not on file  Occupational History  . Not on file  Tobacco Use  . Smoking status: Former Research scientist (life sciences)  . Smokeless tobacco: Never Used  . Tobacco comment: quit when she was 30s  Substance and Sexual Activity  . Alcohol use: No    Alcohol/week: 0.0 standard drinks  . Drug use: No  . Sexual activity: Yes    Birth control/protection: None  Other Topics Concern  . Not on file  Social History Narrative  . Not on file   Social Determinants of Health   Financial Resource Strain:   . Difficulty of Paying Living Expenses: Not on file  Food Insecurity:   . Worried About Charity fundraiser in the Last Year: Not on file  . Ran Out of Food in the Last Year: Not on file  Transportation Needs:   . Lack of Transportation (Medical): Not on file  . Lack of Transportation (Non-Medical): Not on file  Physical Activity:   . Days of Exercise per Week: Not on file  . Minutes of Exercise per Session: Not on file  Stress:   . Feeling of Stress : Not on file  Social Connections:   . Frequency of Communication with Friends and  Family: Not on file  . Frequency of Social Gatherings with Friends and Family: Not on file  . Attends Religious Services: Not on file  . Active Member of Clubs or Organizations: Not on file  . Attends Archivist Meetings: Not on file  . Marital Status: Not on file    Vitals:   08/30/19 0848  BP: 130/70  Pulse: 71  Resp: 16  SpO2: 94%    Wt Readings from Last 3 Encounters:  08/30/19 217 lb (98.4 kg)  06/21/19 221 lb (100.2 kg)  07/26/18 227 lb (103 kg)    Body mass index is 38.44 kg/m.   Physical  Exam  Nursing note and vitals reviewed. Constitutional: She is oriented to person, place, and time. She appears well-developed. No distress.  HENT:  Head: Normocephalic and atraumatic.  Mouth/Throat: Oropharynx is clear and moist and mucous membranes are normal.  Cerumen excess, TM seen partially.  Eyes: Pupils are equal, round, and reactive to light. Conjunctivae are normal.  Cardiovascular: Normal rate and regular rhythm.  No murmur heard. Pulses:      Dorsalis pedis pulses are 2+ on the right side and 2+ on the left side.  Respiratory: Effort normal and breath sounds normal. No respiratory distress.  GI: Soft. She exhibits no mass. There is no hepatomegaly. There is no abdominal tenderness.  Musculoskeletal:        General: No edema.  Lymphadenopathy:    She has no cervical adenopathy.  Neurological: She is alert and oriented to person, place, and time. She has normal strength. No cranial nerve deficit. Gait normal.  Skin: Skin is warm. No rash noted. No erythema.  Psychiatric: She has a normal mood and affect.  Well groomed, good eye contact.   Diabetic Foot Exam - Simple   Simple Foot Form Diabetic Foot exam was performed with the following findings: Yes 08/30/2019  7:03 PM  Visual Inspection No deformities, no ulcerations, no other skin breakdown bilaterally: Yes Sensation Testing Intact to touch and monofilament testing bilaterally: Yes Pulse  Check Posterior Tibialis and Dorsalis pulse intact bilaterally: Yes Comments     ASSESSMENT AND PLAN:   Ms. KIEANNA WIRKKALA was seen today for AWV and chronic disease management.  Orders Placed This Encounter  Procedures  . DG Bone Density  . Pneumococcal polysaccharide vaccine 23-valent greater than or equal to 2yo subcutaneous/IM  . Microalbumin / creatinine urine ratio  . POC HgB A1c    Lab Results  Component Value Date   HGBA1C 6.2 (A) 08/30/2019   Lab Results  Component Value Date   MICROALBUR 0.7 08/30/2019   Medicare annual wellness visit, subsequent We discussed the importance of staying active, physically and mentally, as well as the benefits of a healthy/balance diet. Low impact exercise that involve stretching and strengthing are ideal. Vaccines updated, We discussed preventive screening for the next 5-10 years, summery of recommendations given in AVS. Fall prevention.  Advance directives and end of life discussed, she does not have POA or living will. Advance Health directives package given.    Essential hypertension BP adequately controlled. No changes in current management.  Asymptomatic postmenopausal estrogen deficiency -     DG Bone Density; Future  Type 2 diabetes mellitus with other specified complication, without long-term current use of insulin ([place]) HgA1C is at goal. Continue non pharmacologic treatment. Regular exercise and healthy diet with avoidance of added sugar food intake is an important part of treatment and recommended. Annual eye exam, periodic dental and foot care. HgA1C in 5-6 months with blood work done at her cardiologist's office.  Need for pneumococcal vaccination -     Pneumococcal polysaccharide vaccine 23-valent greater than or equal to 2yo subcutaneous/IM  Morbid obesity ([place]) She has lost about 10 Lb since her last visit. BMI went from 40 to 38. Encouraged to continue regularly physical activity and healthful  diet. Benefits of wt loss discussed.  Myalgia due to statin Because CAD she may be a good candidate for PCSK9 inhibitors. Continue low fat diet.  She prefers to follow annually.   Return in about 1 year (around 08/29/2020) for AWV and f/u.   Inez Catalina  G. Martinique, MD  [facility]. [place] office.

## 2019-08-30 NOTE — Patient Instructions (Addendum)
A few things to remember from today's visit:   Medicare annual wellness visit, subsequent  Essential hypertension  Asymptomatic postmenopausal estrogen deficiency - Plan: DG Bone Density  Type 2 diabetes mellitus with other specified complication, without long-term current use of insulin ([place]) - Plan: POC HgB A1c, Microalbumin / creatinine urine ratio   Miranda Wong , Thank you for taking time to come for your Medicare Wellness Visit. I appreciate your ongoing commitment to your health goals. Please review the following plan we discussed and let me know if I can assist you in the future.   These are the goals we discussed: Goals    . Exercise 3x per week (30 min per time)     Continue walking daily for 15-30 min as tolerated.       This is a list of the screening recommended for you and due dates:  Health Maintenance  Topic Date Due  . Complete foot exam   01/19/1958  . DEXA scan (bone density measurement)  01/19/2013  . Pneumonia vaccines (2 of 2 - PPSV23) 07/27/2019  . Tetanus Vaccine  08/29/2020*  . Colon Cancer Screening  09/04/2020*  . Hemoglobin A1C  02/28/2020  . Eye exam for diabetics  04/25/2020  . Mammogram  09/13/2020  . Flu Shot  Completed  .  Hepatitis C: One time screening is recommended by Center for Disease Control  (CDC) for  adults born from [birth year] through 1965.   Discontinued  *Topic was postponed. The date shown is not the original due date.    A few tips:  -As we age balance is not as good as it was, so there is a higher risks for falls. Please remove small rugs and furniture that is "in your way" and could increase the risk of falls. Stretching exercises may help with fall prevention: Yoga and Tai Chi are some examples. Low impact exercise is better, so you are not very achy the next day.  -Sun screen and avoidance of direct sun light recommended. Caution with dehydration, if working outdoors be sure to drink enough fluids.  - Some medications are not  safe as we age, increases the risk of side effects and can potentially interact with other medication you are also taken;  including some of over the counter medications. Be sure to let me know when you start a new medication even if it is a dietary/vitamin supplement.   -Healthy diet low in red meet/animal fat and sugar + regular physical activity is recommended.    Screening schedule for the next 5-10 years:  Colon cancer screening due in 08/2020.  Glaucoma screening/eye exam every 1-2 years.  Mammogram for breast cancer screening annually. Flu vaccine annually. Fall prevention   Advance directives:  Please see a lawyer and/or go to this website to help you with advanced directives and designating a health care power of attorney so that your wishes will be followed should you become too ill to make your own medical decisions.  [email or web address]   Please be sure medication list is accurate. If a new problem present, please set up appointment sooner than planned today.

## 2019-09-01 ENCOUNTER — Encounter: Payer: Self-pay | Admitting: Family Medicine

## 2019-09-08 ENCOUNTER — Other Ambulatory Visit: Payer: Self-pay | Admitting: Family Medicine

## 2019-09-08 DIAGNOSIS — Z1231 Encounter for screening mammogram for malignant neoplasm of breast: Secondary | ICD-10-CM

## 2019-09-11 ENCOUNTER — Other Ambulatory Visit: Payer: Self-pay | Admitting: Physician Assistant

## 2019-11-03 ENCOUNTER — Ambulatory Visit: Payer: Medicare Other | Attending: Internal Medicine

## 2019-11-03 DIAGNOSIS — Z23 Encounter for immunization: Secondary | ICD-10-CM | POA: Insufficient documentation

## 2019-11-03 NOTE — Progress Notes (Signed)
   Covid-19 Vaccination Clinic  Name:  Miranda Wong    MRN: [id number] DOB: Jun 04, 1946  11/03/2019  [name] was observed post Covid-19 immunization for 15 minutes without incident. She was provided with Vaccine Information Sheet and instruction to access the V-Safe system.   [name] was instructed to call 911 with any severe reactions post vaccine: Marland Kitchen Difficulty breathing  . Swelling of face and throat  . A fast heartbeat  . A bad rash all over body  . Dizziness and weakness   Immunizations Administered    Name Date Dose VIS Date Route   Pfizer COVID-19 Vaccine 11/03/2019  4:25 PM 0.3 mL 08/11/2019 Intramuscular   Manufacturer: [place]   Lot: [id number]   [place]: [id number]

## 2019-11-20 ENCOUNTER — Other Ambulatory Visit: Payer: Medicare Other

## 2019-11-20 ENCOUNTER — Ambulatory Visit: Payer: Medicare Other

## 2019-11-28 ENCOUNTER — Other Ambulatory Visit: Payer: Self-pay | Admitting: Physician Assistant

## 2019-11-28 DIAGNOSIS — I255 Ischemic cardiomyopathy: Secondary | ICD-10-CM

## 2019-11-28 DIAGNOSIS — I10 Essential (primary) hypertension: Secondary | ICD-10-CM

## 2019-11-28 DIAGNOSIS — I2510 Atherosclerotic heart disease of native coronary artery without angina pectoris: Secondary | ICD-10-CM

## 2019-12-05 ENCOUNTER — Ambulatory Visit: Payer: Medicare Other | Attending: Internal Medicine

## 2019-12-05 DIAGNOSIS — Z23 Encounter for immunization: Secondary | ICD-10-CM

## 2019-12-05 NOTE — Progress Notes (Signed)
   Covid-19 Vaccination Clinic  Name:  Miranda Wong    MRN: [id number] DOB: 06-14-1946  12/05/2019  [name] was observed post Covid-19 immunization for 15 minutes without incident. She was provided with Vaccine Information Sheet and instruction to access the V-Safe system.   [name] was instructed to call 911 with any severe reactions post vaccine: Marland Kitchen Difficulty breathing  . Swelling of face and throat  . A fast heartbeat  . A bad rash all over body  . Dizziness and weakness   Immunizations Administered    Name Date Dose VIS Date Route   Pfizer COVID-19 Vaccine 12/05/2019 10:14 AM 0.3 mL 08/11/2019 Intramuscular   Manufacturer: [employer], [employer]   Lot: [id number]   [place]: [id number]

## 2020-01-16 ENCOUNTER — Telehealth: Payer: Self-pay | Admitting: Family Medicine

## 2020-01-16 NOTE — Chronic Care Management (AMB) (Signed)
  Chronic Care Management   Note  01/16/2020 Name: Miranda Wong MRN: [id number] DOB: 1946/02/01  Miranda Wong is a 74 y.o. year old female who is a primary care patient of Martinique, Malka So, MD. I reached out to [name] by phone today in response to a referral sent by [name]'s PCP, Martinique, Betty G, MD.   [name] was given information about Chronic Care Management services today including:  1. CCM service includes personalized support from designated clinical staff supervised by her physician, including individualized plan of care and coordination with other care providers 2. 24/7 contact phone numbers for assistance for urgent and routine care needs. 3. Service will only be billed when office clinical staff spend 20 minutes or more in a month to coordinate care. 4. Only one practitioner may furnish and bill the service in a calendar month. 5. The patient may stop CCM services at any time (effective at the end of the month) by phone call to the office staff.   Patient agreed to services and verbal consent obtained.   Follow up plan:  [place]

## 2020-01-17 ENCOUNTER — Ambulatory Visit
Admission: RE | Admit: 2020-01-17 | Discharge: 2020-01-17 | Disposition: A | Discharge status: Home / Self Care | Payer: Medicare Other | Source: Ambulatory Visit | Attending: Family Medicine | Admitting: Family Medicine

## 2020-01-17 ENCOUNTER — Other Ambulatory Visit: Payer: Self-pay

## 2020-01-17 DIAGNOSIS — Z780 Asymptomatic menopausal state: Secondary | ICD-10-CM | POA: Diagnosis not present

## 2020-01-17 DIAGNOSIS — Z1231 Encounter for screening mammogram for malignant neoplasm of breast: Secondary | ICD-10-CM

## 2020-01-17 DIAGNOSIS — M85852 Other specified disorders of bone density and structure, left thigh: Secondary | ICD-10-CM | POA: Diagnosis not present

## 2020-01-29 ENCOUNTER — Encounter: Payer: Self-pay | Admitting: Family Medicine

## 2020-02-08 IMAGING — MG DIGITAL SCREENING BILATERAL MAMMOGRAM WITH TOMO AND CAD
8 series · 8 of 24 positions shown · non-contrast
Comparison: Previous exam(s).

ACR Breast Density Category a: The breast tissue is almost entirely
fatty.

CLINICAL DATA: Screening.

EXAM:
DIGITAL SCREENING BILATERAL MAMMOGRAM WITH TOMO AND CAD

[L CC synth-2D]
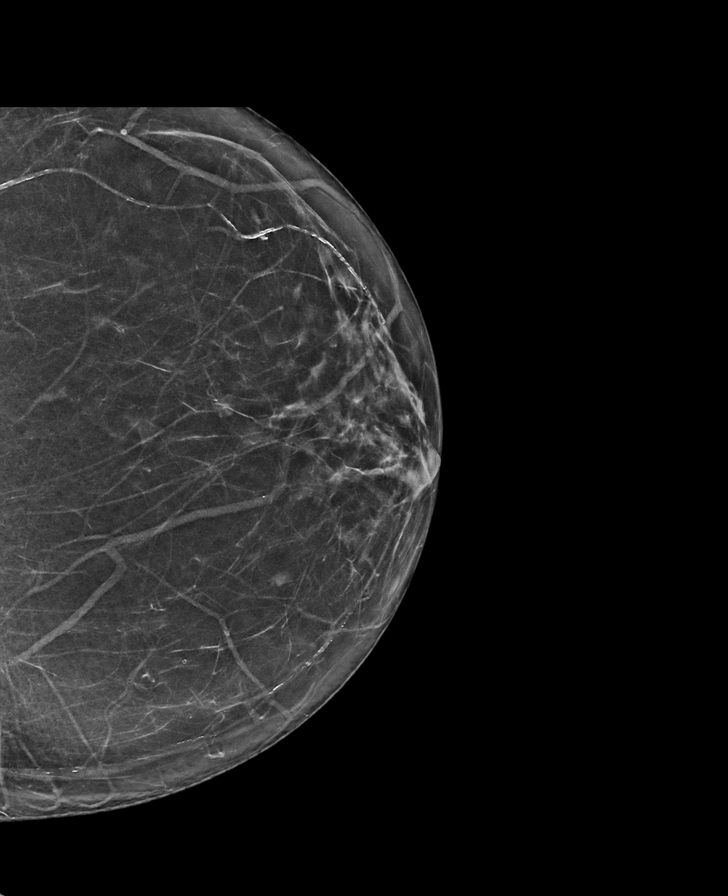

[L MLO synth-2D]
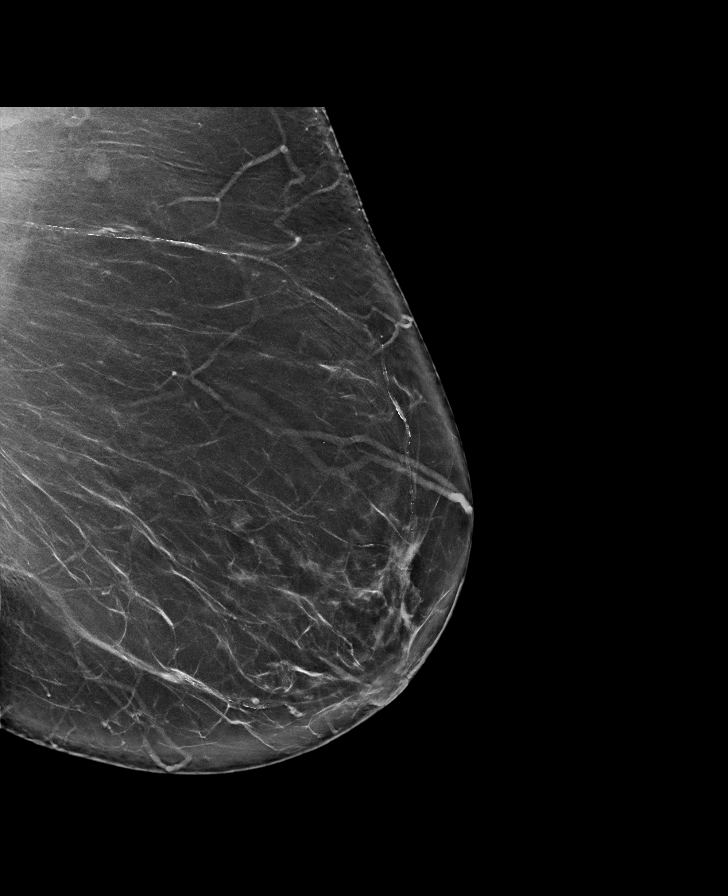

[R MLO synth-2D]
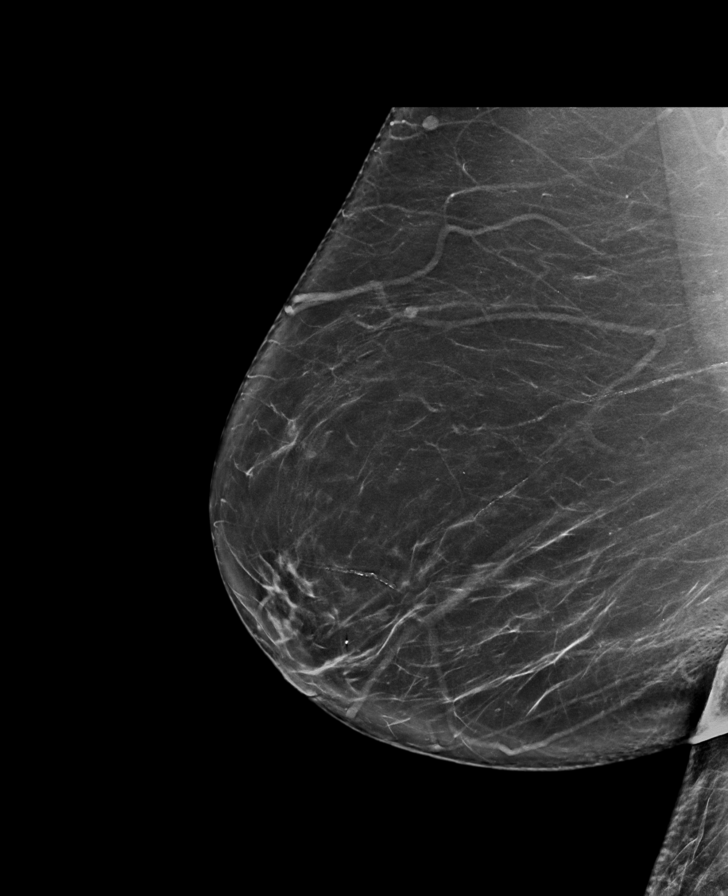

[R CC synth-2D]
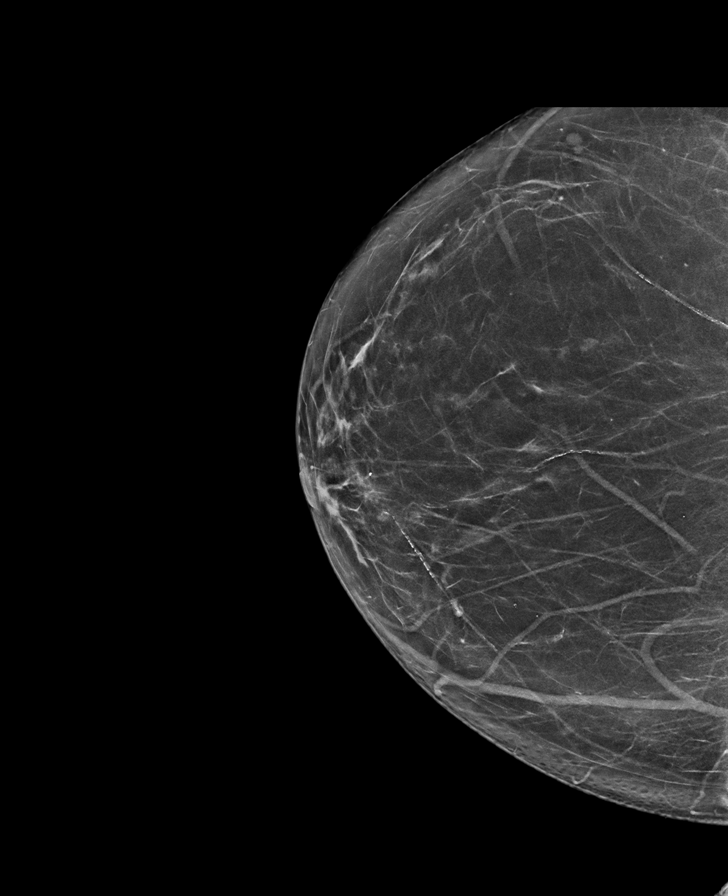

[L CC tomo · tomo slice 35/69.0]
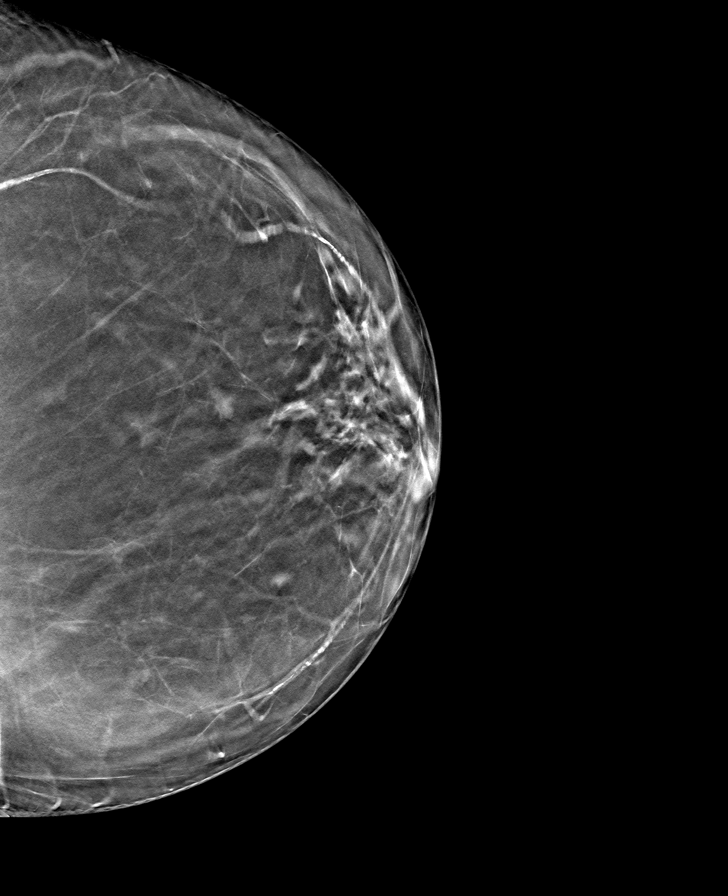

[L MLO tomo · tomo slice 46/91.0]
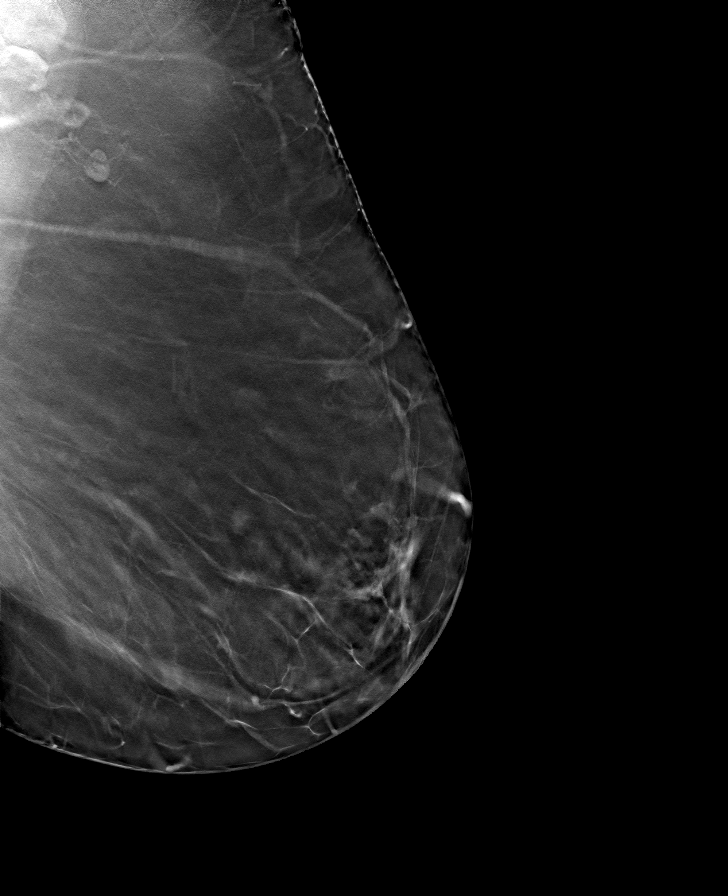

[R MLO tomo · tomo slice 45/90.0]
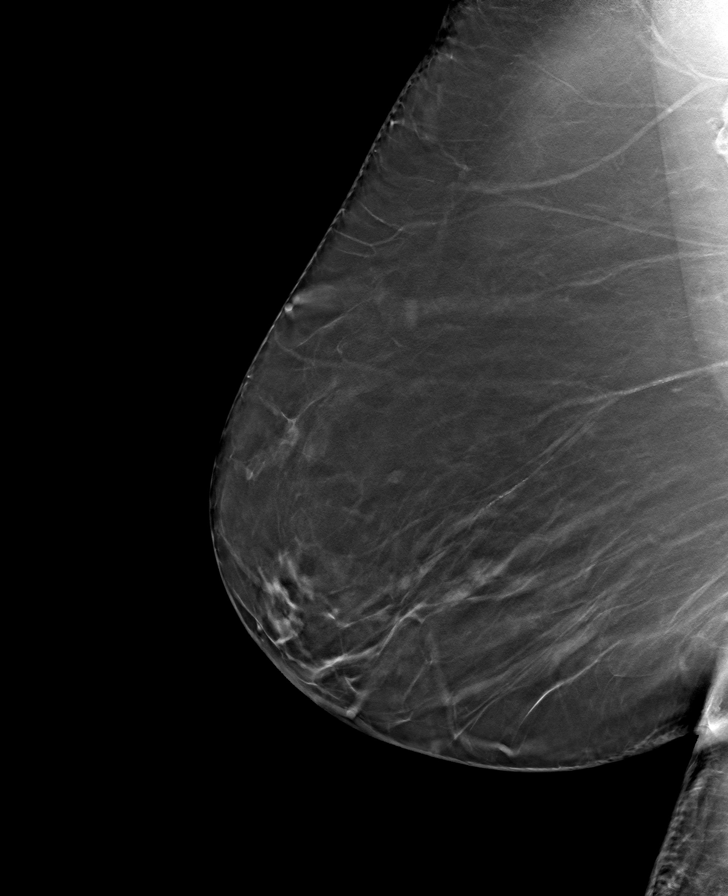

[R CC tomo · tomo slice 37/74.0]
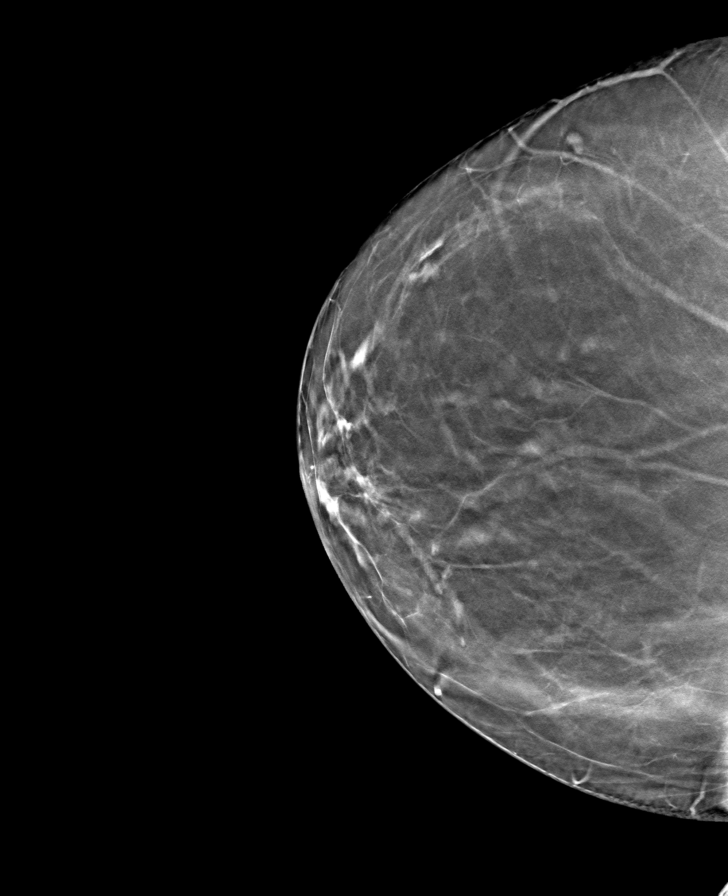

[8 of 24 positions shown; findings below may reference images not displayed]

FINDINGS: There are no findings suspicious for malignancy. Images were
processed with CAD.
IMPRESSION: No mammographic evidence of malignancy. A result letter of this
screening mammogram will be mailed directly to the patient.

RECOMMENDATION:
Screening mammogram in one year. (Code:[id number])

BI-RADS CATEGORY  1: Negative.

## 2020-02-20 ENCOUNTER — Telehealth: Payer: Self-pay

## 2020-02-20 ENCOUNTER — Other Ambulatory Visit: Payer: Self-pay | Admitting: *Deleted

## 2020-02-20 ENCOUNTER — Telehealth: Payer: Medicare Other

## 2020-02-20 DIAGNOSIS — E1169 Type 2 diabetes mellitus with other specified complication: Secondary | ICD-10-CM

## 2020-02-20 DIAGNOSIS — I10 Essential (primary) hypertension: Secondary | ICD-10-CM

## 2020-02-20 NOTE — Telephone Encounter (Signed)
Referral placed as requested.

## 2020-02-20 NOTE — Telephone Encounter (Signed)
°  Chronic Care Management   Outreach Note  02/20/2020 Name: Miranda Wong MRN: [id number] DOB: [birth year]  Referred by: Martinique, Betty G, MD Reason for referral : Chronic Care Management   An unsuccessful telephone outreach was attempted today. The patient was referred to the pharmacist for assistance with care management and care coordination.   Follow Up Plan:  Left HIPAA compliant message for patient to return call to: [phone]. Patient information to be given to scheduling team to reschedule initial CCM visit if no response is heard from patient today.     Anson Crofts, PharmD Clinical Pharmacist [facility] [place] [phone]

## 2020-02-20 NOTE — Telephone Encounter (Signed)
  Chronic Care Management   Outreach Note  02/20/2020 Name: Miranda Wong MRN: [id number] DOB: Aug 29, 1946  Referred by: Martinique, Betty G, MD Reason for referral : Chronic Care Management  Had a missed call from patient. A second unsuccessful telephone outreach was attempted today.   Follow Up Plan:  Left message to return call to: [place], PharmD Clinical Pharmacist [facility] [place] [phone]

## 2020-06-21 ENCOUNTER — Other Ambulatory Visit: Payer: Self-pay | Admitting: Physician Assistant

## 2020-06-21 DIAGNOSIS — I2510 Atherosclerotic heart disease of native coronary artery without angina pectoris: Secondary | ICD-10-CM

## 2020-07-24 DIAGNOSIS — Z23 Encounter for immunization: Secondary | ICD-10-CM | POA: Diagnosis not present

## 2020-08-05 ENCOUNTER — Ambulatory Visit: Payer: Medicare Other | Attending: Internal Medicine

## 2020-08-05 DIAGNOSIS — Z23 Encounter for immunization: Secondary | ICD-10-CM

## 2020-08-05 NOTE — Progress Notes (Signed)
   Covid-19 Vaccination Clinic  Name:  MAKITA BLOW    MRN: [id number] DOB: 26-Feb-1946  08/05/2020  Ms. Derosa was observed post Covid-19 immunization for 15 minutes without incident. She was provided with Vaccine Information Sheet and instruction to access the V-Safe system.   Ms. Gieselman was instructed to call 911 with any severe reactions post vaccine: Marland Kitchen Difficulty breathing  . Swelling of face and throat  . A fast heartbeat  . A bad rash all over body  . Dizziness and weakness   Immunizations Administered    Name Date Dose VIS Date Route   Pfizer COVID-19 Vaccine 08/05/2020  4:06 PM 0.3 mL 06/19/2020 Intramuscular   Manufacturer: [place]   Lot: [id number]   NDC: [address]-3

## 2020-08-15 ENCOUNTER — Other Ambulatory Visit: Payer: Self-pay | Admitting: Cardiovascular Disease

## 2020-08-15 DIAGNOSIS — I255 Ischemic cardiomyopathy: Secondary | ICD-10-CM

## 2020-08-15 DIAGNOSIS — I2510 Atherosclerotic heart disease of native coronary artery without angina pectoris: Secondary | ICD-10-CM

## 2020-08-19 ENCOUNTER — Telehealth: Payer: Self-pay | Admitting: Physician Assistant

## 2020-08-19 NOTE — Telephone Encounter (Signed)
    Pt said she needs to get her blood work done, she said she needs to check her cholesterol, no order on file

## 2020-08-19 NOTE — Telephone Encounter (Signed)
I called and left patient a message to call back. 

## 2020-08-20 NOTE — Telephone Encounter (Signed)
Follow up   Pt is returning call to [facility]  Call transferred to [facility]

## 2020-08-20 NOTE — Telephone Encounter (Signed)
Patient returned my call from yesterday, she states that she just wanted to make sure blood work would be checked at office visit tomorrow. She will be fasting and states that Scott usually checks her cholesterol when she comes in. Advised patient that blood work will be checked tomorrow.

## 2020-08-20 NOTE — Progress Notes (Signed)
Cardiology Office Note:    Date:  08/21/2020   ID:  Miranda Wong, DOB 01/02/1946, MRN [id number]  PCP:  Martinique, Betty G, MD  [facility] Cardiologist:  Sherren Mocha, MD   [facility] Electrophysiologist:  None   Referring MD: Martinique, Betty G, MD   Chief Complaint:  Follow-up (CAD)    Patient Profile:    Miranda Wong is a 74 y.o. female with:   Coronary artery disease  ? S/p anterior NSTEMI in 10/16 >> PCI: DES to LAD  Ischemic CM  ? EF 45-50 post MI in 2016 ? EF 2/17: EF 65-70  Hypertension   Hyperlipidemia  ? Intol of Atorva (myalgias); Rosuva (cost)   Prior CV studies: Echo 10/10/15 Vigorous LVF, EF 65-70%, normal wall motion, grade 1 diastolic dysfunction, normal RVSF, mild TR, PASP 38 mmHg  Echo 06/03/15 Mild LVH, EF 45-50%, anteroseptal HK  LHC 06/02/15 LAD: Proximal 100% >>PCI: 3 x 15 mm Xience DES LCx: Proximal 25% RCA: Mid 25% EF 35-45%  History of Present Illness:    [name] returns for follow-up.  She is here alone.  She has not had chest discomfort, significant shortness of breath, syncope, leg edema.  She sleeps on an incline chronically.      Past Medical History:  Diagnosis Date  . Allergy   . CAD (coronary artery disease)    cath 06/02/2015 25% mid RCA, 25% prox LCx, 100% prox LAD treated with DES (3.0 x 15 mm Xience), EF 40%  . Chicken pox   . Former smoker   . Heart disease   . Hyperlipidemia   . Hypertension   . Mild ichemic cardiomyopathy, EF 45-50% post cath 06/2015 06/04/2015  . NSTEMI (non-ST elevated myocardial infarction) ([place]) 06/02/2015  . Type 2 diabetes mellitus without complication ([place]) [id number]    Current Medications: Current Meds  Medication Sig  . aspirin EC 81 MG EC tablet Take 1 tablet (81 mg total) by mouth daily.  Marland Kitchen b complex vitamins tablet Take 1 tablet by mouth every other day.  . carvedilol (COREG) 6.25 MG tablet Take 1 tablet (6.25 mg total) by mouth 2 (two) times daily with a meal. Please  keep upcoming appt in January 2022 for future refills. Thank you  . fluticasone (FLONASE) 50 MCG/ACT nasal spray Place 2 sprays into both nostrils as needed for allergies or rhinitis.  . hydrochlorothiazide (HYDRODIURIL) 25 MG tablet Take 0.5 tablets (12.5 mg total) by mouth daily.  . hydroxypropyl methylcellulose / hypromellose (ISOPTO TEARS / GONIOVISC) 2.5 % ophthalmic solution Place 1 drop into both eyes as needed for dry eyes.  . nitroGLYCERIN (NITROSTAT) 0.4 MG SL tablet Place 1 tablet (0.4 mg total) under the tongue every 5 (five) minutes x 3 doses as needed for chest pain.  . pravastatin (PRAVACHOL) 40 MG tablet TAKE 1 TABLET BY MOUTH EVERY DAY IN THE EVENING  . [DISCONTINUED] lisinopril (ZESTRIL) 10 MG tablet TAKE 1 TABLET BY MOUTH EVERY DAY     Allergies:   Patient has no known allergies.   Social History   Tobacco Use  . Smoking status: Former Research scientist (life sciences)  . Smokeless tobacco: Never Used  . Tobacco comment: quit when she was 30s  Substance Use Topics  . Alcohol use: No    Alcohol/week: 0.0 standard drinks  . Drug use: No     Family Hx: The patient's family history includes Breast cancer in her mother; Heart attack in her father; Valvular heart disease in her mother.  ROS   EKGs/Labs/Other Test Reviewed:    EKG:  EKG is   ordered today.  The ekg ordered today demonstrates sinus bradycardia, HR 59, left axis deviation, septal Q waves, QTC 419, no significant change since prior tracing  Recent Labs: 08/21/2020: ALT 75; BUN 9; Creatinine, Ser 0.88; Potassium 4.0; Sodium 138   Recent Lipid Panel Lab Results  Component Value Date/Time   CHOL 165 08/21/2020 11:53 AM   TRIG 145 08/21/2020 11:53 AM   HDL 58 08/21/2020 11:53 AM   CHOLHDL 2.8 08/21/2020 11:53 AM   CHOLHDL 3 07/27/2017 10:02 AM   LDLCALC 82 08/21/2020 11:53 AM      Risk Assessment/Calculations:      Physical Exam:    VS:  BP 120/70   Pulse (!) 59   Ht 5\' 3"  (1.6 m)   Wt 226 lb 3.2 oz (102.6 kg)    SpO2 93%   BMI 40.07 kg/m     Wt Readings from Last 3 Encounters:  08/21/20 226 lb 3.2 oz (102.6 kg)  08/30/19 217 lb (98.4 kg)  06/21/19 221 lb (100.2 kg)     Constitutional:      Appearance: Healthy appearance. Not in distress.  Neck:     Thyroid: No thyromegaly.     Vascular: No carotid bruit. JVD normal.  Pulmonary:     Effort: Pulmonary effort is normal.     Breath sounds: No wheezing. No rales.  Cardiovascular:     Normal rate. Regular rhythm. Normal S1. Normal S2.     Murmurs: There is no murmur.  Edema:    Peripheral edema absent.  Abdominal:     Palpations: Abdomen is soft. There is no hepatomegaly.  Skin:    General: Skin is warm and dry.  Neurological:     Mental Status: Alert and oriented to person, place and time.     Cranial Nerves: Cranial nerves are intact.       ASSESSMENT & PLAN:    1. Coronary artery disease involving native coronary artery of native heart without angina pectoris History of non-ST elevation myocardial infarction October 2016 treated with drug-eluting stent to the LAD.  Her EF was mildly depressed at the time of her myocardial infarction.  This improved to normal by follow-up echocardiogram in 2017.  She is doing well without anginal symptoms.  It has been over 5 years since her myocardial infarction.  I have recommended proceeding with a routine treadmill test for risk stratification.  She will contact us when she is ready to schedule this.  Continue aspirin, carvedilol, lisinopril, pravastatin.  2. Essential hypertension The patient's blood pressure is controlled on her current regimen.  Continue current therapy.   3. Mixed hyperlipidemia Continue pravastatin 40 mg daily.  She stopped taking ezetimibe due to hair loss.  Repeat CMET, lipids today.  If LDL greater than 70, consider bempedoic acid.  Shared Decision Making/Informed Consent The risks [chest pain, shortness of breath, cardiac arrhythmias, dizziness, blood pressure  fluctuations, myocardial infarction, stroke/transient ischemic attack, and life-threatening complications (estimated to be 1 in 10,000)], benefits (risk stratification, diagnosing coronary artery disease, treatment guidance) and alternatives of an exercise tolerance test were discussed in detail with Ms. Henne and she agrees to proceed.   Dispo:  Return in about 1 year (around 08/21/2021) for Routine Follow Up, w/ Dr. Burt Knack, or Richardson Dopp, PA-C, in person.   Medication Adjustments/Labs and Tests Ordered: Current medicines are reviewed at length with the patient today.  Concerns regarding medicines  are outlined above.  Tests Ordered: Orders Placed This Encounter  Procedures  . Comprehensive metabolic panel  . Lipid panel  . EKG 12-Lead   Medication Changes: Meds ordered this encounter  Medications  . lisinopril (ZESTRIL) 10 MG tablet    Sig: Take 1 tablet (10 mg total) by mouth in the morning and at bedtime.    Dispense:  180 tablet    Refill:  1    Signed, Richardson Dopp, PA-C  08/21/2020 5:08 PM    [facility] [place], [place],   [address] Phone: [phone]; Fax: [phone]

## 2020-08-21 ENCOUNTER — Ambulatory Visit (INDEPENDENT_AMBULATORY_CARE_PROVIDER_SITE_OTHER): Payer: Medicare Other | Admitting: Physician Assistant

## 2020-08-21 ENCOUNTER — Other Ambulatory Visit: Payer: Self-pay

## 2020-08-21 ENCOUNTER — Encounter: Payer: Self-pay | Admitting: Physician Assistant

## 2020-08-21 VITALS — BP 120/70 | HR 59 | Ht 63.0 in | Wt 226.2 lb

## 2020-08-21 DIAGNOSIS — I2510 Atherosclerotic heart disease of native coronary artery without angina pectoris: Secondary | ICD-10-CM | POA: Diagnosis not present

## 2020-08-21 DIAGNOSIS — I255 Ischemic cardiomyopathy: Secondary | ICD-10-CM

## 2020-08-21 DIAGNOSIS — I10 Essential (primary) hypertension: Secondary | ICD-10-CM

## 2020-08-21 DIAGNOSIS — E782 Mixed hyperlipidemia: Secondary | ICD-10-CM | POA: Diagnosis not present

## 2020-08-21 LAB — COMPREHENSIVE METABOLIC PANEL
ALT: 75 IU/L — ABNORMAL HIGH (ref 0–32)
AST: 114 IU/L — ABNORMAL HIGH (ref 0–40)
Albumin/Globulin Ratio: 1.5 (ref 1.2–2.2)
Albumin: 4.1 g/dL (ref 3.7–4.7)
Alkaline Phosphatase: 126 IU/L — ABNORMAL HIGH (ref 44–121)
BUN/Creatinine Ratio: 10 — ABNORMAL LOW (ref 12–28)
BUN: 9 mg/dL (ref 8–27)
Bilirubin Total: 0.7 mg/dL (ref 0.0–1.2)
CO2: 27 mmol/L (ref 20–29)
Calcium: 9.7 mg/dL (ref 8.7–10.3)
Chloride: 99 mmol/L (ref 96–106)
Creatinine, Ser: 0.88 mg/dL (ref 0.57–1.00)
GFR calc Af Amer: 76 mL/min/{1.73_m2} (ref >59)
GFR calc non Af Amer: 66 mL/min/{1.73_m2} (ref >59)
Globulin, Total: 2.7 g/dL (ref 1.5–4.5)
Glucose: 186 mg/dL — ABNORMAL HIGH (ref 65–99)
Potassium: 4 mmol/L (ref 3.5–5.2)
Sodium: 138 mmol/L (ref 134–144)
Total Protein: 6.8 g/dL (ref 6.0–8.5)

## 2020-08-21 LAB — LIPID PANEL
Chol/HDL Ratio: 2.8 ratio (ref 0.0–4.4)
Cholesterol, Total: 165 mg/dL (ref 100–199)
HDL: 58 mg/dL (ref >39)
LDL Chol Calc (NIH): 82 mg/dL (ref 0–99)
Triglycerides: 145 mg/dL (ref 0–149)
VLDL Cholesterol Cal: 25 mg/dL (ref 5–40)

## 2020-08-21 MED ORDER — LISINOPRIL 10 MG PO TABS: 10.0000 mg | ORAL_TABLET | Freq: Two times a day (BID) | ORAL | 1 refills | Status: DC

## 2020-08-21 NOTE — Patient Instructions (Signed)
Medication Instructions:  Your physician recommends that you continue on your current medications as directed. Please refer to the Current Medication list given to you today.  *If you need a refill on your cardiac medications before your next appointment, please call your pharmacy*  Lab Work: You will have labs drawn today: CMET/Lipids  Testing/Procedures: Call when you are ready to schedule your stress test  Follow-Up: At [facility], you and your health needs are our priority.  As part of our continuing mission to provide you with exceptional heart care, we have created designated Provider Care Teams.  These Care Teams include your primary Cardiologist (physician) and Advanced Practice Providers (APPs -  Physician Assistants and Nurse Practitioners) who all work together to provide you with the care you need, when you need it.  Your next appointment:   12 month(s)  The format for your next appointment:   In Person  Provider:   You may see Sherren Mocha, MD or Richardson Dopp, PA-C

## 2020-08-22 ENCOUNTER — Telehealth: Payer: Self-pay | Admitting: *Deleted

## 2020-08-22 DIAGNOSIS — E782 Mixed hyperlipidemia: Secondary | ICD-10-CM

## 2020-08-22 NOTE — Telephone Encounter (Signed)
-----   Message from Liliane Shi, [place] sent at 08/21/2020  5:48 PM EST ----- Glucose elevated.  Creatinine, K+ are normal.  LFTs elevated.  LDL above goal. She will need follow-up with primary care to evaluate her LFTs.  She probably needs a hepatic ultrasound as well as serology to rule out hepatitis. Since she cannot take Ezetimibe, her LDL is not to goal and her LFTs are elevated, she would benefit from referral to the lipid clinic to consider PCSK9 inhibitor therapy. PLAN:  -Please arrange follow-up with PCP for elevated LFTs -If drinking any alcohol, reduce intake/stop  -Refer to lipid clinic at [facility] for consideration of PCSK9 inhibitor therapy -I will copy Dr. Martinique (PCP) as Miranda Leyden, PA-C    08/21/2020 5:40 PM

## 2020-09-03 ENCOUNTER — Ambulatory Visit: Payer: Medicare Other | Admitting: Physician Assistant

## 2020-09-05 ENCOUNTER — Telehealth: Payer: Self-pay | Admitting: Pharmacist

## 2020-09-05 ENCOUNTER — Encounter: Payer: Self-pay | Admitting: Pharmacist

## 2020-09-05 ENCOUNTER — Ambulatory Visit (INDEPENDENT_AMBULATORY_CARE_PROVIDER_SITE_OTHER): Payer: Medicare Other | Admitting: Pharmacist

## 2020-09-05 ENCOUNTER — Other Ambulatory Visit: Payer: Self-pay

## 2020-09-05 DIAGNOSIS — E782 Mixed hyperlipidemia: Secondary | ICD-10-CM

## 2020-09-05 DIAGNOSIS — I2510 Atherosclerotic heart disease of native coronary artery without angina pectoris: Secondary | ICD-10-CM

## 2020-09-05 MED ORDER — PRALUENT 75 MG/ML ~~LOC~~ SOAJ: 1.0000 mL | SUBCUTANEOUS | 1 refills | Status: DC

## 2020-09-05 NOTE — Telephone Encounter (Signed)
PA for Praluent approved through 06/07/2020

## 2020-09-05 NOTE — Progress Notes (Signed)
Patient ID: Miranda Wong                 DOB: [birth year]                    MRN: [id number]     HPI: Miranda Wong is a 75 y.o. female patient referred to lipid clinic by [name]. PMH is significant for cardiomyopathy, CAD, HTN, NSTEMI, and DM.  Seen by Tereso Newcomer on 12/21 for CAD and ran CMP.  Lab work showed elevated LFTs, blood glucose, and LDL of 82.  Patient presents today in good spirits.  Reports she does not think there is much issue with her cholesterol.  Does not follow any diet.  Reports she does not like vegetables, fish and only likes certain types of meat.  Yesterday she ate a peanut butter sandwich and a bowl of Rice Krispies.  Drinks Pepsi and fruit juice.   Researched the Mediterranean diet and will not do it because she does not like those foods.  Lives alone with her dog.  Has not been physically active and is trying to avoid gatherings due to Covid.  Daughter has moved to the [place].  Does not check her blood sugar at home  Denies alcohol use and Tylenol.  Current Medications: Pravastatin 40mg  Intolerances: Lipitor, Crestor Risk Factors:  CAD, DM, hx of NSTEMI LDL goal: <55  Social History: no drinking, no tobacco, no tylenol  Labs: TC 165, Trigs 145, HDL 58, LDL 82 (08/21/20 on pravastatin 40mg )  Past Medical History:  Diagnosis Date  . Allergy   . CAD (coronary artery disease)    cath 06/02/2015 25% mid RCA, 25% prox LCx, 100% prox LAD treated with DES (3.0 x 15 mm Xience), EF 40%  . Chicken pox   . Former smoker   . Heart disease   . Hyperlipidemia   . Hypertension   . Mild ichemic cardiomyopathy, EF 45-50% post cath 06/2015 06/04/2015  . NSTEMI (non-ST elevated myocardial infarction) ([facility]) 06/02/2015  . Type 2 diabetes mellitus without complication ([facility]) 06/22/2019    Current Outpatient Medications on File Prior to Visit  Medication Sig Dispense Refill  . aspirin EC 81 MG EC tablet Take 1 tablet (81 mg total) by mouth daily.    08/02/2015 b complex  vitamins tablet Take 1 tablet by mouth every other day.    . carvedilol (COREG) 6.25 MG tablet Take 1 tablet (6.25 mg total) by mouth 2 (two) times daily with a meal. Please keep upcoming appt in January 2022 for future refills. Thank you 180 tablet 0  . fluticasone (FLONASE) 50 MCG/ACT nasal spray Place 2 sprays into both nostrils as needed for allergies or rhinitis.    . hydrochlorothiazide (HYDRODIURIL) 25 MG tablet Take 0.5 tablets (12.5 mg total) by mouth daily. 45 tablet 1  . hydroxypropyl methylcellulose / hypromellose (ISOPTO TEARS / GONIOVISC) 2.5 % ophthalmic solution Place 1 drop into both eyes as needed for dry eyes.    Marland Kitchen lisinopril (ZESTRIL) 10 MG tablet Take 1 tablet (10 mg total) by mouth in the morning and at bedtime. 180 tablet 1  . nitroGLYCERIN (NITROSTAT) 0.4 MG SL tablet Place 1 tablet (0.4 mg total) under the tongue every 5 (five) minutes x 3 doses as needed for chest pain. 25 tablet 3  . pravastatin (PRAVACHOL) 40 MG tablet TAKE 1 TABLET BY MOUTH EVERY DAY IN THE EVENING 90 tablet 2   No current facility-administered medications on file prior to  visit.    No Known Allergies  Assessment/Plan:  1. Hyperlipidemia - LDL today 82 which is above goal of <55.  Aggressive goal pursued due to history of NSTEMI and DM.  Unclear why patient has elevated LFTs (denies alcohol use and acetaminophen) but will have patient discontinue pravastatin at this time.  Patient diet is poor which is likely contributory.  Drinks sodas and consumes simple sugars and saturated fats.  Recommended patient try to increase vegetables and whole grains but patient is resistant.  Patient needs further LDL lowering for risk prevention especially since pravastatin is being d/c.  Is willing to start PCSK9i.  Believe Praluent is likely covered under formulary plan.  Using demo pen, educated patient on storage, site selection, and administtration.  Patient was able to demonstrate use in room.  Will complete PA.   Repeat lipid panel set for March 2022.    STOP pravastatin 40mg  daily START Praluent 75mg  every 14 days RECHECK lipid panel in 2-3 months  Karren Cobble, PharmD, BCACP, [place], [place] [id number], [place], [place] [address] Phone: [phone]; Fax: [phone] 09/05/2020 12:39 PM

## 2020-09-05 NOTE — Patient Instructions (Addendum)
It was nice meeting you today!  We would like your LDL (bad cholesterol) to be less than 55  Please discontinue the pravastatin  We will start a new medication called Praluent which you will inject once every 2 weeks  We will check your cholesterol levels in about 2-3 months  Laural Golden, PharmD, BCACP, CDCES, CPP [facility] [address] [place], [place] [address] Phone: [phone]; Fax: [phone] 09/05/2020 11:35 AM

## 2020-09-06 ENCOUNTER — Telehealth: Payer: Self-pay | Admitting: Pharmacist

## 2020-09-06 MED ORDER — ACETAMINOPHEN 500 MG PO CAPS: 1.0000 | ORAL_CAPSULE | Freq: Four times a day (QID) | ORAL | 0 refills | Status: AC | PRN

## 2020-09-06 NOTE — Telephone Encounter (Signed)
Patient called to report she used to take Tylenol but has not recently because she ran out.  Praluent copay was 700 dollars.  Applied for [employer] but they are not accepting new patients at this time.  Will continue to submit

## 2020-09-06 NOTE — Telephone Encounter (Signed)
Patient called and left a VM for [facility]. States she cannot afford the medication he sent in yesterday and would like Gerald Stabs to call her back at [phone]

## 2020-09-10 ENCOUNTER — Encounter: Payer: Medicare Other | Admitting: Family Medicine

## 2020-09-10 NOTE — Telephone Encounter (Signed)
[employer] approved.  Called info into [facility], copay 0 dollars for Praluent.    BIN [id number] PCN  [id number] [id number] ID [id number]  Called patient and left message on machine

## 2020-09-11 NOTE — Telephone Encounter (Signed)
Called patient and left another message regarding zero dollar copay

## 2020-09-24 ENCOUNTER — Encounter: Payer: Medicare Other | Admitting: Family Medicine

## 2020-10-03 ENCOUNTER — Other Ambulatory Visit: Payer: Self-pay

## 2020-10-03 NOTE — Progress Notes (Signed)
HPI:  Miranda Wong is a 75 y.o. female, who is here today for her AWV and follow up.   She was last seen on 08/30/2019. No new problems sine her last visit. She has followed with her cardiologist regularly, had labs done recently.  She lives alone. Independent ADL's and IADL's. No falls in the past year and denies depression symptoms. Sometimes she feels sad when she remembers her children, both has died unexpected at age 51 and 73 yo. She does not think she needs medication for depression.  Functional Status Survey: Is the patient deaf or have difficulty hearing?: No Does the patient have difficulty seeing, even when wearing glasses/contacts?: No Does the patient have difficulty concentrating, remembering, or making decisions?: No Does the patient have difficulty walking or climbing stairs?: No Does the patient have difficulty dressing or bathing?: No Does the patient have difficulty doing errands alone such as visiting a doctor's office or shopping?: No  Fall Risk  10/04/2020 09/01/2019 07/26/2018 07/27/2017  Falls in the past year? 0 0 0 No  Number falls in past yr: 0 0 0 -  Injury with Fall? 0 0 0 -  Follow up Education provided Education provided Education provided -   Providers she sees regularly: Cardiologist: Richardson Dopp. Eye care provider: Florencia Reasons  Depression screen [facility] 2/9 10/04/2020  Decreased Interest 0  Down, Depressed, Hopeless 0  PHQ - 2 Score 0     Mini-Cog - 10/04/20 0821    Normal clock drawing test? yes    How many words correct? 3            Hearing Screening   125Hz  250Hz  500Hz  1000Hz  2000Hz  3000Hz  4000Hz  6000Hz  8000Hz   Right ear:           Left ear:             Visual Acuity Screening   Right eye Left eye Both eyes  Without correction:     With correction: 20/30 20/30 20/30    She does not exercise regularly and pretty much eats what she wants. Wt has been otherwsie stable. She does not enjoy eating vegetables.  Enjoys hanging  up with her sister, who lives close by. She likes to go out to eat and to the movie theater, both have been challenging due to restrictions related to [place] 19 cases.   DM II: She is on non pharmacologic treatment. She is not checking BS's.  Lab Results  Component Value Date   HGBA1C 6.2 (A) 08/30/2019   Lab Results  Component Value Date   MICROALBUR 0.7 08/30/2019   MICROALBUR 0.9 07/26/2018   HTN:She is on Lisinopril 10 mg daily and Carvedilol 6.25 mg bid,and HCTZ 25 mg daily. Negative for severe/frequent headache, visual changes, chest pain, dyspnea, palpitation, claudication, focal weakness, or edema.  Lab Results  Component Value Date   CREATININE 0.88 08/21/2020   BUN 9 08/21/2020   NA 138 08/21/2020   K 4.0 08/21/2020   CL 99 08/21/2020   CO2 27 08/21/2020   HLD: She is not on statin because abnormal LFT's. Recently started on Praluent q 14 days, has done x 2 and has tolerated well.  Lab Results  Component Value Date   CHOL 165 08/21/2020   HDL 58 08/21/2020   LDLCALC 82 08/21/2020   TRIG 145 08/21/2020   CHOLHDL 2.8 08/21/2020   She does not drink alcohol. Negative for fever,abnormal wt loss,or night sweats. She has not noted changes in stool/urine  color,N/V,or jaundice. S/P cholecystectomy.  Lab Results  Component Value Date   ALT 75 (H) 08/21/2020   AST 114 (H) 08/21/2020   ALKPHOS 126 (H) 08/21/2020   BILITOT 0.7 08/21/2020   Review of Systems  Constitutional: Negative for activity change, appetite change, fatigue and fever.  HENT: Negative for mouth sores, nosebleeds and sore throat.   Respiratory: Negative for cough and wheezing.   Cardiovascular: Negative for leg swelling.  Gastrointestinal: Negative for abdominal pain.       Negative for changes in bowel habits.  Genitourinary: Negative for decreased urine volume, dysuria and hematuria.  Neurological: Negative for syncope, facial asymmetry and weakness.  Hematological: Negative for  adenopathy. Does not bruise/bleed easily.  Rest of ROS, see pertinent positives sand negatives in HPI  Current Outpatient Medications on File Prior to Visit  Medication Sig Dispense Refill  . Acetaminophen 500 MG capsule Take 1 capsule (500 mg total) by mouth every 6 (six) hours as needed for fever. 30 capsule 0  . Alirocumab (PRALUENT) 75 MG/ML SOAJ Inject 1 mL into the skin every 14 (fourteen) days. 6 mL 1  . aspirin EC 81 MG EC tablet Take 1 tablet (81 mg total) by mouth daily.    Marland Kitchen b complex vitamins tablet Take 1 tablet by mouth every other day.    . carvedilol (COREG) 6.25 MG tablet Take 1 tablet (6.25 mg total) by mouth 2 (two) times daily with a meal. Please keep upcoming appt in January 2022 for future refills. Thank you 180 tablet 0  . fluticasone (FLONASE) 50 MCG/ACT nasal spray Place 2 sprays into both nostrils as needed for allergies or rhinitis.    . hydrochlorothiazide (HYDRODIURIL) 25 MG tablet Take 0.5 tablets (12.5 mg total) by mouth daily. 45 tablet 1  . hydroxypropyl methylcellulose / hypromellose (ISOPTO TEARS / GONIOVISC) 2.5 % ophthalmic solution Place 1 drop into both eyes as needed for dry eyes.    Marland Kitchen lisinopril (ZESTRIL) 10 MG tablet Take 1 tablet (10 mg total) by mouth in the morning and at bedtime. 180 tablet 1  . nitroGLYCERIN (NITROSTAT) 0.4 MG SL tablet Place 1 tablet (0.4 mg total) under the tongue every 5 (five) minutes x 3 doses as needed for chest pain. 25 tablet 3   No current facility-administered medications on file prior to visit.   Past Medical History:  Diagnosis Date  . Allergy   . CAD (coronary artery disease)    cath 06/02/2015 25% mid RCA, 25% prox LCx, 100% prox LAD treated with DES (3.0 x 15 mm Xience), EF 40%  . Chicken pox   . Former smoker   . Heart disease   . Hyperlipidemia   . Hypertension   . Mild ichemic cardiomyopathy, EF 45-50% post cath 06/2015 06/04/2015  . NSTEMI (non-ST elevated myocardial infarction) ([place]) 06/02/2015  . Type 2  diabetes mellitus without complication ([place]) 24/23/5361   Allergies  Allergen Reactions  . Atorvastatin Other (See Comments)    myalgia   Social History   Socioeconomic History  . Marital status: Widowed    Spouse name: Not on file  . Number of children: Not on file  . Years of education: Not on file  . Highest education level: Not on file  Occupational History  . Not on file  Tobacco Use  . Smoking status: Former Research scientist (life sciences)  . Smokeless tobacco: Never Used  . Tobacco comment: quit when she was 30s  Substance and Sexual Activity  . Alcohol use: No  Alcohol/week: 0.0 standard drinks  . Drug use: No  . Sexual activity: Yes    Birth control/protection: None  Other Topics Concern  . Not on file  Social History Narrative  . Not on file   Social Determinants of Health   Financial Resource Strain: Not on file  Food Insecurity: Not on file  Transportation Needs: Not on file  Physical Activity: Not on file  Stress: Not on file  Social Connections: Not on file   Vitals:   10/04/20 0750  BP: 110/70  Pulse: 70  Resp: 16  SpO2: 94%    Wt Readings from Last 3 Encounters:  10/04/20 221 lb 8 oz (100.5 kg)  08/21/20 226 lb 3.2 oz (102.6 kg)  08/30/19 217 lb (98.4 kg)   Body mass index is 39.24 kg/m.  Physical Exam Vitals and nursing note reviewed.  Constitutional:      General: She is not in acute distress.    Appearance: She is well-developed.  HENT:     Head: Normocephalic and atraumatic.     Mouth/Throat:     Mouth: Oropharynx is clear and moist and mucous membranes are normal. Mucous membranes are moist.     Pharynx: Oropharynx is clear.  Eyes:     General: No scleral icterus.    Conjunctiva/sclera: Conjunctivae normal.     Pupils: Pupils are equal, round, and reactive to light.  Cardiovascular:     Rate and Rhythm: Normal rate and regular rhythm.     Pulses:          Dorsalis pedis pulses are 2+ on the right side and 2+ on the left side.     Heart sounds:  No murmur heard.   Pulmonary:     Effort: Pulmonary effort is normal. No respiratory distress.     Breath sounds: Normal breath sounds.  Abdominal:     Palpations: Abdomen is soft. There is no hepatomegaly or mass.     Tenderness: There is no abdominal tenderness.  Musculoskeletal:        General: No edema.  Lymphadenopathy:     Cervical: No cervical adenopathy.  Skin:    General: Skin is warm.     Findings: No erythema or rash.  Neurological:     Mental Status: She is alert and oriented to person, place, and time.     Cranial Nerves: No cranial nerve deficit.     Gait: Gait normal.     Deep Tendon Reflexes: Strength normal.  Psychiatric:        Mood and Affect: Mood and affect normal.     Comments: Well groomed, good eye contact.    Diabetic Foot Exam - Simple   Simple Foot Form Diabetic Foot exam was performed with the following findings: Yes 10/04/2020  8:47 AM  Visual Inspection No deformities, no ulcerations, no other skin breakdown bilaterally: Yes Sensation Testing Intact to touch and monofilament testing bilaterally: Yes Pulse Check Posterior Tibialis and Dorsalis pulse intact bilaterally: Yes Comments    ASSESSMENT AND PLAN:  Miranda Wong was seen today for AWV and follow-up.  Orders Placed This Encounter  Procedures  . US Abdomen Limited RUQ (LIVER/GB)  . CBC  . Fructosamine  . Hemoglobin A1c  . Hepatic function panel  . Hepatitis B surface antibody,qualitative  . Hepatitis B surface antigen  . Hepatitis C antibody  . Lipid panel  . Microalbumin / creatinine urine ratio   Lab Results  Component Value Date   CHOL 152  10/04/2020   HDL 56.20 10/04/2020   LDLCALC 71 10/04/2020   TRIG 121.0 10/04/2020   CHOLHDL 3 10/04/2020   Lab Results  Component Value Date   ALT 93 (H) 10/04/2020   AST 110 (H) 10/04/2020   ALKPHOS 109 10/04/2020   BILITOT 0.9 10/04/2020   Lab Results  Component Value Date   HGBA1C 7.7 (H) 10/04/2020   Lab Results   Component Value Date   MICROALBUR 1.5 10/04/2020   MICROALBUR 0.7 08/30/2019   Lab Results  Component Value Date   WBC 4.9 10/04/2020   HGB 15.1 (H) 10/04/2020   HCT 43.0 10/04/2020   MCV 91.8 10/04/2020   PLT 177.0 10/04/2020    Medicare annual wellness visit, subsequent She understands the importance of staying active, physically and mentally, as well as the benefits of a healthy/balance diet. Low impact exercise that involve stretching and strengthing are ideal. Vaccines up to date. We discussed preventive screening for the next 5-10 years, summery of recommendations given in AVS. Fall prevention. Cologuard due in 08/2021. Mammogram done on 01/17/20. DEXA 12/2019. Immunization History  Administered Date(s) Administered  . Influenza, High Dose Seasonal PF 07/27/2017, 07/26/2018, 05/27/2019, 07/24/2020  . PFIZER(Purple Top)SARS-COV-2 Vaccination 11/03/2019, 12/05/2019, 08/05/2020  . Pneumococcal Conjugate-13 07/26/2018  . Pneumococcal Polysaccharide-23 08/30/2019  . Zoster 06/26/2016    Advance directives and end of life discussed, she does not have POA or living will but she still has advance healthcare forms given last visit.   Type 2 diabetes mellitus with other specified complication, without long-term current use of insulin ([place]) HgA1C has been at goal. Continue non pharmacologic treatment. Regular exercise and healthy diet with avoidance of added sugar food intake encouraged. Annual eye exam, periodic dental and foot care are important. F/U in 5-6 months  Essential hypertension BP adequately controlled. Continue Lisinopril 10 mg daily and low salt diet.  Abnormal LFTs We discussed possible etiologies. ? Fatty liver. Wt loss and low fat diet are important part of the treatment. We can consider adding vit E. Further recommendations will be given according to LFT's results.  Mixed hyperlipidemia LDL goal < 70. Continue Praluent and low fat diet. She would like  FLP done today. Following with cardiologist.  Morbid obesity ([place]) About 4 Lb gain since 08/2019. She understands benefits of wt loss as well as adverse effects of obesity. Consistency with healthy diet and physical activity encouraged.  Return in about 6 months (around 04/03/2021).    G. Martinique, MD  [facility]. [place] office.  Ms. Fauth , Thank you for taking time to come for your Medicare Wellness Visit. I appreciate your ongoing commitment to your health goals. Please review the following plan we discussed and let me know if I can assist you in the future.   These are the goals we discussed: Goals    . Exercise 3x per week (30 min per time)     Continue walking daily for 15-30 min as tolerated.       This is a list of the screening recommended for you and due dates:  Health Maintenance  Topic Date Due  . Colon Cancer Screening  Never done  . Hemoglobin A1C  02/28/2020  . Eye exam for diabetics  04/25/2020  . Complete foot exam   08/29/2020  . Tetanus Vaccine  09/05/2021*  . Mammogram  01/16/2022  . Flu Shot  Completed  . DEXA scan (bone density measurement)  Completed  . COVID-19 Vaccine  Completed  . Pneumonia  vaccines  Completed  .  Hepatitis C: One time screening is recommended by Center for Disease Control  (CDC) for  adults born from [birth year] through 1963.   Discontinued  *Topic was postponed. The date shown is not the original due date.   A few things to remember from today's visit:   Type 2 diabetes mellitus with other specified complication, without long-term current use of insulin ([place]) - Plan: Hemoglobin A1c, Fructosamine, Lipid panel  Essential hypertension  Medicare annual wellness visit, subsequent  Abnormal LFTs - Plan: Hepatitis C antibody, Hepatitis B surface antigen, Hepatitis B surface antibody,qualitative, Microalbumin / creatinine urine ratio, CBC, US Abdomen Limited RUQ (LIVER/GB), Hepatic function panel  Mixed  hyperlipidemia  If you need refills please call your pharmacy. Do not use My Chart to request refills or for acute issues that need immediate attention.   Please be sure medication list is accurate. If a new problem present, please set up appointment sooner than planned today.

## 2020-10-04 ENCOUNTER — Ambulatory Visit (INDEPENDENT_AMBULATORY_CARE_PROVIDER_SITE_OTHER): Payer: Medicare Other | Admitting: Family Medicine

## 2020-10-04 ENCOUNTER — Encounter: Payer: Self-pay | Admitting: Family Medicine

## 2020-10-04 VITALS — BP 110/70 | HR 70 | Resp 16 | Ht 63.0 in | Wt 221.5 lb

## 2020-10-04 DIAGNOSIS — R945 Abnormal results of liver function studies: Secondary | ICD-10-CM

## 2020-10-04 DIAGNOSIS — Z0000 Encounter for general adult medical examination without abnormal findings: Secondary | ICD-10-CM

## 2020-10-04 DIAGNOSIS — E1169 Type 2 diabetes mellitus with other specified complication: Secondary | ICD-10-CM | POA: Diagnosis not present

## 2020-10-04 DIAGNOSIS — I10 Essential (primary) hypertension: Secondary | ICD-10-CM | POA: Diagnosis not present

## 2020-10-04 DIAGNOSIS — R7989 Other specified abnormal findings of blood chemistry: Secondary | ICD-10-CM | POA: Insufficient documentation

## 2020-10-04 DIAGNOSIS — I2510 Atherosclerotic heart disease of native coronary artery without angina pectoris: Secondary | ICD-10-CM | POA: Diagnosis not present

## 2020-10-04 DIAGNOSIS — E782 Mixed hyperlipidemia: Secondary | ICD-10-CM

## 2020-10-04 LAB — CBC
HCT: 43 % (ref 36.0–46.0)
Hemoglobin: 15.1 g/dL — ABNORMAL HIGH (ref 12.0–15.0)
MCHC: 35 g/dL (ref 30.0–36.0)
MCV: 91.8 fl (ref 78.0–100.0)
Platelets: 177 10*3/uL (ref 150.0–400.0)
RBC: 4.68 Mil/uL (ref 3.87–5.11)
RDW: 12.9 % (ref 11.5–15.5)
WBC: 4.9 10*3/uL (ref 4.0–10.5)

## 2020-10-04 LAB — MICROALBUMIN / CREATININE URINE RATIO
Creatinine,U: 250.2 mg/dL
Microalb Creat Ratio: 0.6 mg/g (ref 0.0–30.0)
Microalb, Ur: 1.5 mg/dL (ref 0.0–1.9)

## 2020-10-04 LAB — LIPID PANEL
Cholesterol: 152 mg/dL (ref 0–200)
HDL: 56.2 mg/dL (ref >39.00)
LDL Cholesterol: 71 mg/dL (ref 0–99)
NonHDL: 95.67
Total CHOL/HDL Ratio: 3
Triglycerides: 121 mg/dL (ref 0.0–149.0)
VLDL: 24.2 mg/dL (ref 0.0–40.0)

## 2020-10-04 LAB — HEMOGLOBIN A1C: Hgb A1c MFr Bld: 7.7 % — ABNORMAL HIGH (ref 4.6–6.5)

## 2020-10-04 LAB — HEPATIC FUNCTION PANEL
ALT: 93 U/L — ABNORMAL HIGH (ref 0–35)
AST: 110 U/L — ABNORMAL HIGH (ref 0–37)
Albumin: 4 g/dL (ref 3.5–5.2)
Alkaline Phosphatase: 109 U/L (ref 39–117)
Bilirubin, Direct: 0.2 mg/dL (ref 0.0–0.3)
Total Bilirubin: 0.9 mg/dL (ref 0.2–1.2)
Total Protein: 7.3 g/dL (ref 6.0–8.3)

## 2020-10-04 NOTE — Patient Instructions (Addendum)
  Ms. Miranda Wong , Thank you for taking time to come for your Medicare Wellness Visit. I appreciate your ongoing commitment to your health goals. Please review the following plan we discussed and let me know if I can assist you in the future.   These are the goals we discussed: Goals    . Exercise 3x per week (30 min per time)     Continue walking daily for 15-30 min as tolerated.       This is a list of the screening recommended for you and due dates:  Health Maintenance  Topic Date Due  . Colon Cancer Screening  Never done  . Hemoglobin A1C  02/28/2020  . Eye exam for diabetics  04/25/2020  . Complete foot exam   08/29/2020  . Tetanus Vaccine  09/05/2021*  . Mammogram  01/16/2022  . Flu Shot  Completed  . DEXA scan (bone density measurement)  Completed  . COVID-19 Vaccine  Completed  . Pneumonia vaccines  Completed  .  Hepatitis C: One time screening is recommended by Center for Disease Control  (CDC) for  adults born from [birth year] through 1963.   Discontinued  *Topic was postponed. The date shown is not the original due date.   A few things to remember from today's visit:   Type 2 diabetes mellitus with other specified complication, without long-term current use of insulin ([place]) - Plan: Hemoglobin A1c, Fructosamine, Lipid panel  Essential hypertension  Medicare annual wellness visit, subsequent  Abnormal LFTs - Plan: Hepatitis C antibody, Hepatitis B surface antigen, Hepatitis B surface antibody,qualitative, Microalbumin / creatinine urine ratio, CBC, US Abdomen Limited RUQ (LIVER/GB), Hepatic function panel  Mixed hyperlipidemia  If you need refills please call your pharmacy. Do not use My Chart to request refills or for acute issues that need immediate attention.   Please be sure medication list is accurate. If a new problem present, please set up appointment sooner than planned today.

## 2020-10-08 LAB — HEPATITIS C ANTIBODY
Hepatitis C Ab: NONREACTIVE
SIGNAL TO CUT-OFF: 0 (ref <1.00)

## 2020-10-08 LAB — HEPATITIS B SURFACE ANTIBODY,QUALITATIVE: Hep B S Ab: NONREACTIVE

## 2020-10-08 LAB — HEPATITIS B SURFACE ANTIGEN: Hepatitis B Surface Ag: NONREACTIVE

## 2020-10-08 LAB — FRUCTOSAMINE: Fructosamine: 295 umol/L — ABNORMAL HIGH (ref 205–285)

## 2020-10-14 ENCOUNTER — Other Ambulatory Visit: Payer: Self-pay | Admitting: Physician Assistant

## 2020-10-14 DIAGNOSIS — I2510 Atherosclerotic heart disease of native coronary artery without angina pectoris: Secondary | ICD-10-CM

## 2020-10-16 MED ORDER — LISINOPRIL 10 MG PO TABS: 10.0000 mg | ORAL_TABLET | Freq: Two times a day (BID) | ORAL | 3 refills | Status: DC

## 2020-10-21 ENCOUNTER — Ambulatory Visit
Admission: RE | Admit: 2020-10-21 | Discharge: 2020-10-21 | Disposition: A | Discharge status: Home / Self Care | Payer: Medicare Other | Source: Ambulatory Visit | Attending: Family Medicine | Admitting: Family Medicine

## 2020-10-21 ENCOUNTER — Other Ambulatory Visit: Payer: Self-pay | Admitting: Physician Assistant

## 2020-10-21 DIAGNOSIS — R7989 Other specified abnormal findings of blood chemistry: Secondary | ICD-10-CM

## 2020-10-21 DIAGNOSIS — I2510 Atherosclerotic heart disease of native coronary artery without angina pectoris: Secondary | ICD-10-CM

## 2020-10-21 DIAGNOSIS — R945 Abnormal results of liver function studies: Secondary | ICD-10-CM

## 2020-10-21 DIAGNOSIS — K760 Fatty (change of) liver, not elsewhere classified: Secondary | ICD-10-CM | POA: Diagnosis not present

## 2020-10-22 MED ORDER — LISINOPRIL 10 MG PO TABS: 10.0000 mg | ORAL_TABLET | Freq: Two times a day (BID) | ORAL | 3 refills | Status: DC

## 2020-11-13 ENCOUNTER — Other Ambulatory Visit: Payer: Self-pay

## 2020-11-13 ENCOUNTER — Other Ambulatory Visit: Payer: Medicare Other | Admitting: *Deleted

## 2020-11-13 DIAGNOSIS — E782 Mixed hyperlipidemia: Secondary | ICD-10-CM

## 2020-11-13 DIAGNOSIS — I2510 Atherosclerotic heart disease of native coronary artery without angina pectoris: Secondary | ICD-10-CM | POA: Diagnosis not present

## 2020-11-13 LAB — LIPID PANEL
Chol/HDL Ratio: 2 ratio (ref 0.0–4.4)
Cholesterol, Total: 121 mg/dL (ref 100–199)
HDL: 60 mg/dL (ref >39)
LDL Chol Calc (NIH): 43 mg/dL (ref 0–99)
Triglycerides: 97 mg/dL (ref 0–149)
VLDL Cholesterol Cal: 18 mg/dL (ref 5–40)

## 2020-12-04 ENCOUNTER — Other Ambulatory Visit: Payer: Self-pay | Admitting: Family Medicine

## 2020-12-04 DIAGNOSIS — Z1231 Encounter for screening mammogram for malignant neoplasm of breast: Secondary | ICD-10-CM

## 2021-01-23 ENCOUNTER — Other Ambulatory Visit: Payer: Self-pay

## 2021-01-23 ENCOUNTER — Ambulatory Visit
Admission: RE | Admit: 2021-01-23 | Discharge: 2021-01-23 | Disposition: A | Discharge status: Home / Self Care | Payer: Medicare Other | Source: Ambulatory Visit | Attending: Family Medicine | Admitting: Family Medicine

## 2021-01-23 DIAGNOSIS — Z1231 Encounter for screening mammogram for malignant neoplasm of breast: Secondary | ICD-10-CM

## 2021-01-28 ENCOUNTER — Other Ambulatory Visit: Payer: Self-pay | Admitting: Family Medicine

## 2021-01-28 DIAGNOSIS — D171 Benign lipomatous neoplasm of skin and subcutaneous tissue of trunk: Secondary | ICD-10-CM

## 2021-02-04 ENCOUNTER — Ambulatory Visit
Admission: RE | Admit: 2021-02-04 | Discharge: 2021-02-04 | Disposition: A | Discharge status: Home / Self Care | Payer: Medicare Other | Source: Ambulatory Visit | Attending: Family Medicine | Admitting: Family Medicine

## 2021-02-04 ENCOUNTER — Other Ambulatory Visit: Payer: Self-pay

## 2021-02-04 DIAGNOSIS — R922 Inconclusive mammogram: Secondary | ICD-10-CM | POA: Diagnosis not present

## 2021-02-04 DIAGNOSIS — D171 Benign lipomatous neoplasm of skin and subcutaneous tissue of trunk: Secondary | ICD-10-CM

## 2021-02-22 ENCOUNTER — Other Ambulatory Visit: Payer: Self-pay | Admitting: Physician Assistant

## 2021-02-22 DIAGNOSIS — I2510 Atherosclerotic heart disease of native coronary artery without angina pectoris: Secondary | ICD-10-CM

## 2021-02-22 DIAGNOSIS — E782 Mixed hyperlipidemia: Secondary | ICD-10-CM

## 2021-02-26 ENCOUNTER — Telehealth: Payer: Self-pay | Admitting: Cardiovascular Disease

## 2021-02-26 NOTE — Telephone Encounter (Signed)
Pt c/o medication issue:  1. Name of Medication: Alirocumab ([place]) 75 MG/ML SOAJ  2. How are you currently taking this medication (dosage and times per day)? As prescribed   3. Are you having a reaction (difficulty breathing--STAT)? No   4. What is your medication issue? Miranda Wong is calling stating it is time for her to renew this medication and she is wanting to know the steps needed to be taken to get it at the discounted price she received before. Requesting Gerald Stabs callback in regards to it unless someone else can assist with this more conveniently. Please advise.

## 2021-02-27 NOTE — Telephone Encounter (Signed)
Called and spoke w/pt and stated that they were approved for [facility] $[phone] good till 08/10/21 and pt voiced understanding.

## 2021-03-06 ENCOUNTER — Other Ambulatory Visit: Payer: Self-pay | Admitting: Cardiovascular Disease

## 2021-03-06 DIAGNOSIS — I10 Essential (primary) hypertension: Secondary | ICD-10-CM

## 2021-03-06 DIAGNOSIS — I2510 Atherosclerotic heart disease of native coronary artery without angina pectoris: Secondary | ICD-10-CM

## 2021-04-04 ENCOUNTER — Other Ambulatory Visit: Payer: Self-pay

## 2021-04-04 ENCOUNTER — Encounter: Payer: Self-pay | Admitting: Family Medicine

## 2021-04-04 ENCOUNTER — Ambulatory Visit (INDEPENDENT_AMBULATORY_CARE_PROVIDER_SITE_OTHER): Payer: Medicare Other | Admitting: Family Medicine

## 2021-04-04 VITALS — BP 130/80 | HR 64 | Resp 16 | Ht 63.0 in | Wt 207.0 lb

## 2021-04-04 DIAGNOSIS — R945 Abnormal results of liver function studies: Secondary | ICD-10-CM

## 2021-04-04 DIAGNOSIS — R7989 Other specified abnormal findings of blood chemistry: Secondary | ICD-10-CM

## 2021-04-04 DIAGNOSIS — E1169 Type 2 diabetes mellitus with other specified complication: Secondary | ICD-10-CM | POA: Diagnosis not present

## 2021-04-04 DIAGNOSIS — I2510 Atherosclerotic heart disease of native coronary artery without angina pectoris: Secondary | ICD-10-CM

## 2021-04-04 DIAGNOSIS — I10 Essential (primary) hypertension: Secondary | ICD-10-CM | POA: Diagnosis not present

## 2021-04-04 DIAGNOSIS — E782 Mixed hyperlipidemia: Secondary | ICD-10-CM

## 2021-04-04 DIAGNOSIS — D696 Thrombocytopenia, unspecified: Secondary | ICD-10-CM

## 2021-04-04 DIAGNOSIS — T148XXA Other injury of unspecified body region, initial encounter: Secondary | ICD-10-CM

## 2021-04-04 LAB — BASIC METABOLIC PANEL
BUN: 10 mg/dL (ref 6–23)
CO2: 29 mEq/L (ref 19–32)
Calcium: 9.6 mg/dL (ref 8.4–10.5)
Chloride: 101 mEq/L (ref 96–112)
Creatinine, Ser: 1.03 mg/dL (ref 0.40–1.20)
GFR: 54.06 mL/min — ABNORMAL LOW (ref >60.00)
Glucose, Bld: 130 mg/dL — ABNORMAL HIGH (ref 70–99)
Potassium: 3.9 mEq/L (ref 3.5–5.1)
Sodium: 138 mEq/L (ref 135–145)

## 2021-04-04 LAB — HEPATIC FUNCTION PANEL
ALT: 40 U/L — ABNORMAL HIGH (ref 0–35)
AST: 66 U/L — ABNORMAL HIGH (ref 0–37)
Albumin: 3.9 g/dL (ref 3.5–5.2)
Alkaline Phosphatase: 108 U/L (ref 39–117)
Bilirubin, Direct: 0.3 mg/dL (ref 0.0–0.3)
Total Bilirubin: 1.4 mg/dL — ABNORMAL HIGH (ref 0.2–1.2)
Total Protein: 7.4 g/dL (ref 6.0–8.3)

## 2021-04-04 LAB — POCT GLYCOSYLATED HEMOGLOBIN (HGB A1C): Hemoglobin A1C: 6 % — AB (ref 4.0–5.6)

## 2021-04-04 LAB — CBC
HCT: 44.5 % (ref 36.0–46.0)
Hemoglobin: 15.4 g/dL — ABNORMAL HIGH (ref 12.0–15.0)
MCHC: 34.6 g/dL (ref 30.0–36.0)
MCV: 93.8 fl (ref 78.0–100.0)
Platelets: 139 10*3/uL — ABNORMAL LOW (ref 150.0–400.0)
RBC: 4.74 Mil/uL (ref 3.87–5.11)
RDW: 13.4 % (ref 11.5–15.5)
WBC: 4.2 10*3/uL (ref 4.0–10.5)

## 2021-04-04 LAB — PROTIME-INR
INR: 1.1 ratio — ABNORMAL HIGH (ref 0.8–1.0)
Prothrombin Time: 11.8 s (ref 9.6–13.1)

## 2021-04-04 LAB — TSH: TSH: 2.86 u[IU]/mL (ref 0.35–5.50)

## 2021-04-04 NOTE — Assessment & Plan Note (Addendum)
We discussed benefits of wt loss as well as adverse effects of obesity. She has lost about 14 Lb since 10/2020, most likely because decreased oral intake. Consistency with healthy diet and physical activity encouraged.

## 2021-04-04 NOTE — Assessment & Plan Note (Addendum)
BP adequately controlled. Continue current management: Lisinopril. DASH/low salt diet recommended. Monitor BP at home. Eye exam is current.

## 2021-04-04 NOTE — Patient Instructions (Signed)
A few things to remember from today's visit:   Type 2 diabetes mellitus with other specified complication, without long-term current use of insulin ([place]) - Plan: POC HgB [id number], Basic metabolic panel, Fructosamine  Essential hypertension - Plan: Basic metabolic panel, TSH, CBC  Mixed hyperlipidemia  Abnormal LFTs - Plan: Hepatic function panel  Bruising - Plan: Protime-INR  If you need refills please call your pharmacy. Do not use My Chart to request refills or for acute issues that need immediate attention.   No changes today.  Please be sure medication list is accurate. If a new problem present, please set up appointment sooner than planned today.

## 2021-04-04 NOTE — Assessment & Plan Note (Addendum)
Reviewed RUQ [place] report, reassured in regard to hemangioma. NASH. Reporting negative hep work up. Wt loss may help. Further recommendations according to LFT's results.

## 2021-04-04 NOTE — Progress Notes (Signed)
HPI:  Chief Complaint  Patient presents with   Follow-up    Miranda Wong is a 75 y.o. female, who is here today for chronic disease management. Last seen on 10/04/20.  Diabetes Mellitus II: Dx'ed in 07/2018. - Checking BG at home: Not checking. - Medications: Non pharmacologic treatment. - Diet: She has decreased sodas intake and eating smaller portions during hot weather. - Exercise: Not consistently. - eye exam: 03/2020, she already has appt on 04/24/21. - foot exam: 10/04/20  - Negative for symptoms of hypoglycemia, polyuria, polydipsia, numbness extremities, foot ulcers/trauma  Lab Results  Component Value Date   HGBA1C 7.7 (H) 10/04/2020   Lab Results  Component Value Date   MICROALBUR 1.5 10/04/2020   Easy bruising since she started Praluent 75 mg q 2 weeks. Injection site erythema and mild pruritus that last < 24 hours. She has not noted nose/gum bleeding,gross hematuria,melena,or blood in stool. She is on Aspirin 81 mg daily.  Hypertension:  Medications:lisinopril 10 mg daily, hydrochlorothiazide 25 mg 1/2 tablet daily, carvedilol 6.25 mg two times daily. BP readings at home:She is not checking. Side effects:None  Negative for unusual or severe headache, visual changes, exertional chest pain, dyspnea,  focal weakness, or edema.  Lab Results  Component Value Date   CREATININE 0.88 08/21/2020   BUN 9 08/21/2020   NA 138 08/21/2020   K 4.0 08/21/2020   CL 99 08/21/2020   CO2 27 08/21/2020   Concernd about liver US, states that she did not receive information about results, so she assumes it was negative for serious process. RUQ US done on 10/21/20: Hepatic steatosis. Subcentimeter left hepatic lesion may reflect a hemangioma. Post cholecystectomy sequela. Note was sent through my chart.  She has had abnormal LFT's. Reporting recent hepatitis screening and negative (hep B,A,C). She has not noted abdominal pain,N/V,or jaundice.  Lab Results   Component Value Date   ALT 93 (H) 10/04/2020   AST 110 (H) 10/04/2020   ALKPHOS 109 10/04/2020   BILITOT 0.9 10/04/2020   Review of Systems  Constitutional: Negative for activity change, appetite change, fatigue and fever.  HENT: Negative for mouth sores and trouble swallowing.   Respiratory: Negative for cough and wheezing.        Negative for changes in bowel habits.  Endocrine: Negative.   Genitourinary: Negative for decreased urine volume, difficulty urinating, or dysuria. Musculoskeletal: Negative for gait problem and myalgias.  Skin: Negative for color change and rash.  Neurological: Negative for syncope, weakness and facial asymmetry. Psychiatric/Behavioral: Negative for confusion,depression,and anxiety.  Rest of ROS see pertinent positives and negatives in HPI.  Current Outpatient Medications on File Prior to Visit  Medication Sig Dispense Refill   Alirocumab (PRALUENT) 75 MG/ML SOAJ Inject 1 mL into the skin every 14 (fourteen) days. 2 mL 11   aspirin EC 81 MG EC tablet Take 1 tablet (81 mg total) by mouth daily.     b complex vitamins tablet Take 1 tablet by mouth every other day.     carvedilol (COREG) 6.25 MG tablet Take 1 tablet (6.25 mg total) by mouth 2 (two) times daily with a meal. Please keep upcoming appt in January 2022 for future refills. Thank you 180 tablet 0   fluticasone (FLONASE) 50 MCG/ACT nasal spray Place 2 sprays into both nostrils as needed for allergies or rhinitis.     hydrochlorothiazide (HYDRODIURIL) 25 MG tablet TAKE 1/2 TABLET BY MOUTH EVERY DAY 45 tablet 1   hydroxypropyl methylcellulose /  hypromellose (ISOPTO TEARS / GONIOVISC) 2.5 % ophthalmic solution Place 1 drop into both eyes as needed for dry eyes.     lisinopril (ZESTRIL) 10 MG tablet Take 1 tablet (10 mg total) by mouth in the morning and at bedtime. 180 tablet 3   nitroGLYCERIN (NITROSTAT) 0.4 MG SL tablet Place 1 tablet (0.4 mg total) under the tongue every 5 (five) minutes x 3 doses as  needed for chest pain. 25 tablet 3   No current facility-administered medications on file prior to visit.    Past Medical History:  Diagnosis Date   Allergy    CAD (coronary artery disease)    cath 06/02/2015 25% mid RCA, 25% prox LCx, 100% prox LAD treated with DES (3.0 x 15 mm Xience), EF 40%   Chicken pox    Former smoker    Heart disease    Hyperlipidemia    Hypertension    Mild ichemic cardiomyopathy, EF 45-50% post cath 06/2015 06/04/2015   NSTEMI (non-ST elevated myocardial infarction) ([place]) 06/02/2015   Type 2 diabetes mellitus without complication ([place]) 123XX123    Allergies  Allergen Reactions   Atorvastatin Other (See Comments)    myalgia    Social History   Socioeconomic History   Marital status: Widowed    Spouse name: Not on file   Number of children: Not on file   Years of education: Not on file   Highest education level: Not on file  Occupational History   Not on file  Tobacco Use   Smoking status: Former   Smokeless tobacco: Never   Tobacco comments:    quit when she was 89s  Substance and Sexual Activity   Alcohol use: No    Alcohol/week: 0.0 standard drinks   Drug use: No   Sexual activity: Yes    Birth control/protection: None  Other Topics Concern   Not on file  Social History Narrative   Not on file   Social Determinants of Health   Financial Resource Strain: Not on file  Food Insecurity: Not on file  Transportation Needs: Not on file  Physical Activity: Not on file  Stress: Not on file  Social Connections: Not on file   Today's Vitals   04/04/21 0950  BP: 130/80  Pulse: 64  Resp: 16  SpO2: 94%  Weight: 207 lb (93.9 kg)  Height: '5\' 3"'$  (1.6 m)   Wt Readings from Last 3 Encounters:  04/04/21 207 lb (93.9 kg)  10/04/20 221 lb 8 oz (100.5 kg)  08/21/20 226 lb 3.2 oz (102.6 kg)   Body mass index is 36.67 kg/m.  Physical Exam  Nursing note and vitals reviewed. Constitutional: She is oriented to person, place, and time. She  appears well-developed. No distress.  HENT:  Head: Normocephalic and atraumatic.  Mouth/Throat: Oropharynx is clear and moist and mucous membranes are normal.  Eyes: Pupils are equal, round, and reactive to light. Conjunctivae are normal.  Cardiovascular: Normal rate and regular rhythm.  No murmur heard. Pulses:      Dorsalis pedis pulses are 2+ on the right side, and 2+ on the left side.  Varicose veins LE, bilateral. Respiratory: Effort normal and breath sounds normal. No respiratory distress.  GI: Soft. She exhibits no mass. There is no hepatomegaly. There is no tenderness.  Musculoskeletal: She exhibits no edema.  Lymphadenopathy:    She has no cervical adenopathy.  Neurological: She is alert and oriented to person, place, and time. She has normal strength. No cranial nerve deficit.  Gait normal.  Skin: Skin is warm. No rash noted. No erythema. Small ecchymosis on LE's.  Psychiatric: She has a normal mood and affect.  Well groomed, good eye contact.   ASSESSMENT AND PLAN:  Ms.Kamaile was seen today for follow-up.  Diagnoses and all orders for this visit: Orders Placed This Encounter  Procedures   Basic metabolic panel   Hepatic function panel   TSH   CBC   Protime-INR   Fructosamine   POC HgB A1c   Lab Results  Component Value Date   CREATININE 1.03 04/04/2021   BUN 10 04/04/2021   NA 138 04/04/2021   K 3.9 04/04/2021   CL 101 04/04/2021   CO2 29 04/04/2021   Lab Results  Component Value Date   ALT 40 (H) 04/04/2021   AST 66 (H) 04/04/2021   ALKPHOS 108 04/04/2021   BILITOT 1.4 (H) 04/04/2021   Lab Results  Component Value Date   WBC 4.2 04/04/2021   HGB 15.4 (H) 04/04/2021   HCT 44.5 04/04/2021   MCV 93.8 04/04/2021   PLT 139.0 (L) 04/04/2021   Lab Results  Component Value Date   TSH 2.86 04/04/2021   Lab Results  Component Value Date   HGBA1C 6.0 (A) 04/04/2021   Bruising We discussed possible etiologies. Aspirin can aggravate  problem. Instructed about warning signs. Further recommendations according to lab results.  Essential hypertension BP adequately controlled. Continue current management: Lisinopril. DASH/low salt diet recommended. Monitor BP at home. Eye exam is current.  Type 2 diabetes mellitus with other specified complication ([place]) 123456 is now at goal. Continue non pharmacologic treatment. Regular exercise and healthy diet with avoidance of added sugar food intake is an important part of treatment and recommended. Annual eye exam and foot care recommended. F/U in 5-6 months   Abnormal LFTs Reviewed RUQ [place] report, reassured in regard to hemangioma. NASH. Reporting negative hep work up. Wt loss may help. Further recommendations according to LFT's results.  Morbid obesity ([place]) We discussed benefits of wt loss as well as adverse effects of obesity. She has lost about 14 Lb since 10/2020, most likely because decreased oral intake. Consistency with healthy diet and physical activity encouraged.  Return in about 6 months (around 10/08/2021) for f/u .  Adith Tejada Martinique, MD [facility]. [place] office.

## 2021-04-04 NOTE — Assessment & Plan Note (Signed)
HgA1C is now at goal. Continue non pharmacologic treatment. Regular exercise and healthy diet with avoidance of added sugar food intake is an important part of treatment and recommended. Annual eye exam and foot care recommended. F/U in 5-6 months

## 2021-04-08 LAB — FRUCTOSAMINE: Fructosamine: 290 umol/L — ABNORMAL HIGH (ref 205–285)

## 2021-04-24 DIAGNOSIS — H2513 Age-related nuclear cataract, bilateral: Secondary | ICD-10-CM | POA: Diagnosis not present

## 2021-05-14 ENCOUNTER — Telehealth: Payer: Self-pay

## 2021-05-14 ENCOUNTER — Other Ambulatory Visit: Payer: Self-pay

## 2021-05-14 NOTE — Telephone Encounter (Signed)
Patient needs a lab appointment for a repeat CBC, it does not have to be fasting. Can you help her get scheduled for a day and time that works best for her? Thank you!

## 2021-05-15 ENCOUNTER — Other Ambulatory Visit (INDEPENDENT_AMBULATORY_CARE_PROVIDER_SITE_OTHER): Payer: Medicare Other

## 2021-05-15 DIAGNOSIS — D696 Thrombocytopenia, unspecified: Secondary | ICD-10-CM

## 2021-05-15 LAB — CBC WITH DIFFERENTIAL/PLATELET
Basophils Absolute: 0 10*3/uL (ref 0.0–0.1)
Basophils Relative: 1.1 % (ref 0.0–3.0)
Eosinophils Absolute: 0.3 10*3/uL (ref 0.0–0.7)
Eosinophils Relative: 6 % — ABNORMAL HIGH (ref 0.0–5.0)
HCT: 44.3 % (ref 36.0–46.0)
Hemoglobin: 15.4 g/dL — ABNORMAL HIGH (ref 12.0–15.0)
Lymphocytes Relative: 27 % (ref 12.0–46.0)
Lymphs Abs: 1.2 10*3/uL (ref 0.7–4.0)
MCHC: 34.7 g/dL (ref 30.0–36.0)
MCV: 93.7 fl (ref 78.0–100.0)
Monocytes Absolute: 0.4 10*3/uL (ref 0.1–1.0)
Monocytes Relative: 9.2 % (ref 3.0–12.0)
Neutro Abs: 2.6 10*3/uL (ref 1.4–7.7)
Neutrophils Relative %: 56.7 % (ref 43.0–77.0)
Platelets: 134 10*3/uL — ABNORMAL LOW (ref 150.0–400.0)
RBC: 4.73 Mil/uL (ref 3.87–5.11)
RDW: 13.3 % (ref 11.5–15.5)
WBC: 4.6 10*3/uL (ref 4.0–10.5)

## 2021-06-01 ENCOUNTER — Other Ambulatory Visit: Payer: Self-pay | Admitting: Cardiovascular Disease

## 2021-06-01 DIAGNOSIS — I2510 Atherosclerotic heart disease of native coronary artery without angina pectoris: Secondary | ICD-10-CM

## 2021-06-01 DIAGNOSIS — I255 Ischemic cardiomyopathy: Secondary | ICD-10-CM

## 2021-06-12 DIAGNOSIS — H35371 Puckering of macula, right eye: Secondary | ICD-10-CM | POA: Diagnosis not present

## 2021-06-12 DIAGNOSIS — H2513 Age-related nuclear cataract, bilateral: Secondary | ICD-10-CM | POA: Diagnosis not present

## 2021-06-12 DIAGNOSIS — H2511 Age-related nuclear cataract, right eye: Secondary | ICD-10-CM | POA: Diagnosis not present

## 2021-06-12 DIAGNOSIS — H25013 Cortical age-related cataract, bilateral: Secondary | ICD-10-CM | POA: Diagnosis not present

## 2021-06-12 DIAGNOSIS — H18413 Arcus senilis, bilateral: Secondary | ICD-10-CM | POA: Diagnosis not present

## 2021-06-12 DIAGNOSIS — H25043 Posterior subcapsular polar age-related cataract, bilateral: Secondary | ICD-10-CM | POA: Diagnosis not present

## 2021-06-13 ENCOUNTER — Telehealth: Payer: Self-pay | Admitting: *Deleted

## 2021-06-13 NOTE — Telephone Encounter (Signed)
   Patient Name: Miranda Wong  DOB: Jul 11, 1946 MRN: [id number]  Primary Cardiologist: Sherren Mocha, MD  Chart reviewed as part of pre-operative protocol coverage. Cataract extractions are recognized in guidelines as low risk surgeries that do not typically require specific preoperative testing or holding of blood thinner therapy. Therefore, given past medical history and time since last visit, based on ACC/AHA guidelines, Miranda Wong would be at acceptable risk for the planned procedure without further cardiovascular testing.   I will route this recommendation to the requesting party via Epic fax function and remove from pre-op pool.  Please call with questions.  Deberah Pelton, NP 06/13/2021, 9:25 AM

## 2021-06-13 NOTE — Telephone Encounter (Signed)
   Pre-operative Risk Assessment    Patient Name: Miranda Wong  DOB: May 11, 1946 MRN: [id number]      Request for Surgical Clearance   Procedure:   CATRACT EXTRACTION w/INTRAOCULAR LENS IMPLANT OF THE RIGHT EYE 08/13/21; FOLLOWED BY THE LEFT EYE 09/10/21  Date of Surgery: Clearance 08/13/21 RIGHT EYE; LEFT EYE 09/10/21                           Surgeon:  DR. Darleen Crocker Surgeon's Group or Practice Name:  [facility] Phone number:  [phone] Fax number:  [phone]   Type of Clearance Requested: - Medical    Type of Anesthesia:   TOPICAL WITH IV MEDICATION   Additional requests/questions: PER DR, BEVIS NO MEDICATIONS ARE NEEDING TO BE HELD INCLUDING BLOOD THINNERS  Signed, Julaine Hua   06/13/2021, 8:59 AM

## 2021-06-19 DIAGNOSIS — Z23 Encounter for immunization: Secondary | ICD-10-CM | POA: Diagnosis not present

## 2021-07-20 ENCOUNTER — Other Ambulatory Visit: Payer: Self-pay | Admitting: Cardiovascular Disease

## 2021-07-20 DIAGNOSIS — I10 Essential (primary) hypertension: Secondary | ICD-10-CM

## 2021-07-20 DIAGNOSIS — I2510 Atherosclerotic heart disease of native coronary artery without angina pectoris: Secondary | ICD-10-CM

## 2021-08-13 DIAGNOSIS — H2511 Age-related nuclear cataract, right eye: Secondary | ICD-10-CM | POA: Diagnosis not present

## 2021-08-14 DIAGNOSIS — H2512 Age-related nuclear cataract, left eye: Secondary | ICD-10-CM | POA: Diagnosis not present

## 2021-08-29 ENCOUNTER — Telehealth: Payer: Self-pay

## 2021-08-29 ENCOUNTER — Other Ambulatory Visit: Payer: Self-pay

## 2021-08-29 ENCOUNTER — Ambulatory Visit (INDEPENDENT_AMBULATORY_CARE_PROVIDER_SITE_OTHER): Payer: Medicare Other | Admitting: Cardiovascular Disease

## 2021-08-29 ENCOUNTER — Encounter: Payer: Self-pay | Admitting: Cardiovascular Disease

## 2021-08-29 VITALS — BP 132/90 | HR 58 | Ht 63.0 in | Wt 213.8 lb

## 2021-08-29 DIAGNOSIS — I10 Essential (primary) hypertension: Secondary | ICD-10-CM

## 2021-08-29 DIAGNOSIS — I255 Ischemic cardiomyopathy: Secondary | ICD-10-CM | POA: Diagnosis not present

## 2021-08-29 DIAGNOSIS — E782 Mixed hyperlipidemia: Secondary | ICD-10-CM

## 2021-08-29 DIAGNOSIS — I2510 Atherosclerotic heart disease of native coronary artery without angina pectoris: Secondary | ICD-10-CM

## 2021-08-29 NOTE — Telephone Encounter (Signed)
Miranda Wong is a patient of Dr. Burt Knack, she needs patient assistance regarding her prescription for praluent. Please reach out to her and let her know what can be done. Thank you.

## 2021-08-29 NOTE — Patient Instructions (Signed)
Medication Instructions:  Your physician recommends that you continue on your current medications as directed. Please refer to the Current Medication list given to you today.  *If you need a refill on your cardiac medications before your next appointment, please call your pharmacy*   Lab Work: None today     Testing/Procedures: None    Follow-Up: At [facility], you and your health needs are our priority.  As part of our continuing mission to provide you with exceptional heart care, we have created designated Provider Care Teams.  These Care Teams include your primary Cardiologist (physician) and Advanced Practice Providers (APPs -  Physician Assistants and Nurse Practitioners) who all work together to provide you with the care you need, when you need it.  We recommend signing up for the patient portal called "MyChart".  Sign up information is provided on this After Visit Summary.  MyChart is used to connect with patients for Virtual Visits (Telemedicine).  Patients are able to view lab/test results, encounter notes, upcoming appointments, etc.  Non-urgent messages can be sent to your provider as well.   To learn more about what you can do with MyChart, go to [email or web address].    Your next appointment:   1 year(s)  The format for your next appointment:   In Person  Provider:   Sherren Mocha, MD   Other Instructions We will speak with our pharmacist at our office to see about patient assistance for your praluent.

## 2021-08-29 NOTE — Telephone Encounter (Signed)
Called and lmom pt that I resolved hersituation at the pharmacy and they stated that it was running through for free

## 2021-08-29 NOTE — Progress Notes (Signed)
Cardiology Office Note:    Date:  08/29/2021   ID:  Miranda, Wong 01/01/1946, MRN [id number]  PCP:  Martinique, Betty G, MD   [facility] Providers Cardiologist:  Sherren Mocha, MD     Referring MD: Martinique, Betty G, MD   Chief Complaint  Patient presents with   Coronary Artery Disease    History of Present Illness:    Miranda Wong is a 75 y.o. female with a hx of: Coronary artery disease  S/p anterior NSTEMI in 10/16 >> PCI: DES to LAD Ischemic CM  EF 45-50 post MI in 2016 EF 2/17: EF 65-70 Hypertension  Hyperlipidemia  Intol of Atorva (myalgias); Rosuva (cost) Established Lipid Clinic 2022 initiated on Praluent  The patient was last seen by Richardson Wong in December 2021.  She is here alone today.  The patient is doing well and denies any symptoms of chest pain, chest pressure, shortness of breath, or heart palpitations.  She has no cardiac-related complaints.  Her Praluent has become cost prohibitive now that she is in the donut hole.  Past Medical History:  Diagnosis Date   Allergy    CAD (coronary artery disease)    cath 06/02/2015 25% mid RCA, 25% prox LCx, 100% prox LAD treated with DES (3.0 x 15 mm Xience), EF 40%   Chicken pox    Former smoker    Heart disease    Hyperlipidemia    Hypertension    Mild ichemic cardiomyopathy, EF 45-50% post cath 06/2015 06/04/2015   NSTEMI (non-ST elevated myocardial infarction) ([place]) 06/02/2015   Type 2 diabetes mellitus without complication ([place]) 54/56/2563    Past Surgical History:  Procedure Laterality Date   CARDIAC CATHETERIZATION N/A 06/02/2015   Procedure: Left Heart Cath and Coronary Angiography;  Surgeon: Sherren Mocha, MD;  Location: [facility];  Service: Cardiovascular;  Laterality: N/A;   CHOLECYSTECTOMY     GALLBLADDER SURGERY     TUBAL LIGATION      Current Medications: Current Meds  Medication Sig   Alirocumab (PRALUENT) 75 MG/ML SOAJ Inject 1 mL into the skin every 14 (fourteen) days.    aspirin EC 81 MG EC tablet Take 1 tablet (81 mg total) by mouth daily.   b complex vitamins tablet Take 1 tablet by mouth every other day.   carvedilol (COREG) 6.25 MG tablet TAKE 1 TABLET BY MOUTH TWICE A DAY WITH A MEAL MUST KEEP JAN APPT FOR REFILLS   fluticasone (FLONASE) 50 MCG/ACT nasal spray Place 2 sprays into both nostrils as needed for allergies or rhinitis.   hydrochlorothiazide (HYDRODIURIL) 25 MG tablet Take 0.5 tablets (12.5 mg total) by mouth daily. Please keep upcoming appt in December 2022 with Dr. Burt Knack before anymore refills. Thank you   hydroxypropyl methylcellulose / hypromellose (ISOPTO TEARS / GONIOVISC) 2.5 % ophthalmic solution Place 1 drop into both eyes as needed for dry eyes.   lisinopril (ZESTRIL) 10 MG tablet Take 1 tablet (10 mg total) by mouth in the morning and at bedtime.   nitroGLYCERIN (NITROSTAT) 0.4 MG SL tablet Place 1 tablet (0.4 mg total) under the tongue every 5 (five) minutes x 3 doses as needed for chest pain.     Allergies:   Atorvastatin   Social History   Socioeconomic History   Marital status: Widowed    Spouse name: Not on file   Number of children: Not on file   Years of education: Not on file   Highest education level: Not on  file  Occupational History   Not on file  Tobacco Use   Smoking status: Former   Smokeless tobacco: Never   Tobacco comments:    quit when she was 37s  Substance and Sexual Activity   Alcohol use: No    Alcohol/week: 0.0 standard drinks   Drug use: No   Sexual activity: Yes    Birth control/protection: None  Other Topics Concern   Not on file  Social History Narrative   Not on file   Social Determinants of Health   Financial Resource Strain: Not on file  Food Insecurity: Not on file  Transportation Needs: Not on file  Physical Activity: Not on file  Stress: Not on file  Social Connections: Not on file     Family History: The patient's family history includes Breast cancer in her mother; Heart  attack in her father; Valvular heart disease in her mother.  ROS:   Please see the history of present illness.    All other systems reviewed and are negative.  EKGs/Labs/Other Studies Reviewed:    EKG:  EKG is ordered today.  The ekg ordered today demonstrates sinus bradycardia 58 bpm, left axis deviation, otherwise within normal limits.  Recent Labs: 04/04/2021: ALT 40; BUN 10; Creatinine, Ser 1.03; Potassium 3.9; Sodium 138; TSH 2.86 05/15/2021: Hemoglobin 15.4; Platelets 134.0  Recent Lipid Panel    Component Value Date/Time   CHOL 121 11/13/2020 1027   TRIG 97 11/13/2020 1027   HDL 60 11/13/2020 1027   CHOLHDL 2.0 11/13/2020 1027   CHOLHDL 3 10/04/2020 0851   VLDL 24.2 10/04/2020 0851   LDLCALC 43 11/13/2020 1027     Risk Assessment/Calculations:           Physical Exam:    VS:  BP 132/90    Pulse (!) 58    Ht 5\' 3"  (1.6 m)    Wt 213 lb 12.8 oz (97 kg)    SpO2 96%    BMI 37.87 kg/m     Wt Readings from Last 3 Encounters:  08/29/21 213 lb 12.8 oz (97 kg)  04/04/21 207 lb (93.9 kg)  10/04/20 221 lb 8 oz (100.5 kg)     GEN:  Well nourished, well developed in no acute distress HEENT: Normal NECK: No JVD; No carotid bruits LYMPHATICS: No lymphadenopathy CARDIAC: RRR, no murmurs, rubs, gallops RESPIRATORY:  Clear to auscultation without rales, wheezing or rhonchi  ABDOMEN: Soft, non-tender, non-distended MUSCULOSKELETAL:  No edema; No deformity  SKIN: Warm and dry NEUROLOGIC:  Alert and oriented x 3 PSYCHIATRIC:  Normal affect   ASSESSMENT:    1. Coronary artery disease involving native coronary artery of native heart without angina pectoris   2. Essential hypertension   3. Mixed hyperlipidemia    PLAN:    In order of problems listed above:  The patient is stable without symptoms of angina.  Continue aspirin, carvedilol, and lisinopril. Blood pressure is borderline, but she reports that this is very unusual for her.  She checks her blood pressure regularly  and it runs in a good range, typically in the 120s over 70s to 80s.  She will continue on lisinopril, hydrochlorothiazide, and carvedilol.  She asked about discontinuation of hydrochlorothiazide today because it was started for leg edema.  I advised her to continue on this as it appears to be an important part of her antihypertensive regimen.  Her most recent labs are reviewed with a potassium of 3.9 and a creatinine of 1.03. Unable to afford  Praluent now that she is in the donut hole.  I will reach out to our pharmacy team to see if there are any other options.  She is willing to take Crestor if a PCSK9 inhibitor cannot be worked out.           Medication Adjustments/Labs and Tests Ordered: Current medicines are reviewed at length with the patient today.  Concerns regarding medicines are outlined above.  No orders of the defined types were placed in this encounter.  No orders of the defined types were placed in this encounter.   There are no Patient Instructions on file for this visit.   Signed, Sherren Mocha, MD  08/29/2021 11:23 AM    [place]

## 2021-09-10 DIAGNOSIS — H2512 Age-related nuclear cataract, left eye: Secondary | ICD-10-CM | POA: Diagnosis not present

## 2021-10-17 ENCOUNTER — Other Ambulatory Visit: Payer: Self-pay | Admitting: Cardiovascular Disease

## 2021-10-17 DIAGNOSIS — I2510 Atherosclerotic heart disease of native coronary artery without angina pectoris: Secondary | ICD-10-CM

## 2021-10-17 DIAGNOSIS — I255 Ischemic cardiomyopathy: Secondary | ICD-10-CM

## 2021-10-29 ENCOUNTER — Other Ambulatory Visit: Payer: Self-pay | Admitting: Cardiovascular Disease

## 2021-10-29 DIAGNOSIS — I2510 Atherosclerotic heart disease of native coronary artery without angina pectoris: Secondary | ICD-10-CM

## 2021-10-31 ENCOUNTER — Encounter: Payer: Self-pay | Admitting: Family Medicine

## 2021-11-09 ENCOUNTER — Other Ambulatory Visit: Payer: Self-pay | Admitting: Cardiovascular Disease

## 2021-11-09 DIAGNOSIS — I10 Essential (primary) hypertension: Secondary | ICD-10-CM

## 2021-11-09 DIAGNOSIS — I2510 Atherosclerotic heart disease of native coronary artery without angina pectoris: Secondary | ICD-10-CM

## 2021-11-17 DIAGNOSIS — Z1211 Encounter for screening for malignant neoplasm of colon: Secondary | ICD-10-CM | POA: Diagnosis not present

## 2021-11-24 LAB — COLOGUARD: COLOGUARD: POSITIVE — AB

## 2021-11-25 ENCOUNTER — Other Ambulatory Visit: Payer: Self-pay

## 2021-11-25 DIAGNOSIS — R195 Other fecal abnormalities: Secondary | ICD-10-CM

## 2021-11-26 ENCOUNTER — Telehealth: Payer: Self-pay | Admitting: Family Medicine

## 2021-11-26 NOTE — Telephone Encounter (Signed)
Left message for patient to call back and schedule Medicare Annual Wellness Visit (AWV) either virtually or in office. Left  my Herbie Drape number [phone] ? ? ?Last AWV 10/04/20 ? please schedule at anytime with [facility] Nurse Health Advisor 1 or 2 ? ? ?.  ?

## 2021-11-28 ENCOUNTER — Encounter: Payer: Self-pay | Admitting: Physician Assistant

## 2021-12-01 ENCOUNTER — Ambulatory Visit (INDEPENDENT_AMBULATORY_CARE_PROVIDER_SITE_OTHER): Payer: Medicare Other

## 2021-12-01 VITALS — Ht 63.0 in | Wt 213.0 lb

## 2021-12-01 DIAGNOSIS — Z0000 Encounter for general adult medical examination without abnormal findings: Secondary | ICD-10-CM | POA: Diagnosis not present

## 2021-12-01 NOTE — Progress Notes (Signed)
? ?Subjective:  ? Miranda Wong is a 76 y.o. female who presents for Medicare Annual (Subsequent) preventive examination. ? ?Review of Systems    ?Virtual Visit via Telephone Note ? ?I connected with  Miranda Wong on 12/01/21 at  1:15 PM EDT by telephone and verified that I am speaking with the correct person using two identifiers. ? ?Location: ?Patient: Home ?Provider: Office ?Persons participating in the virtual visit: patient/Nurse Health Advisor ?  ?I discussed the limitations, risks, security and privacy concerns of performing an evaluation and management service by telephone and the availability of in person appointments. The patient expressed understanding and agreed to proceed. ? ?Interactive audio and video telecommunications were attempted between this nurse and patient, however failed, due to patient having technical difficulties OR patient did not have access to video capability.  We continued and completed visit with audio only. ? ?Some vital signs may be absent or patient reported.  ? ?[name], LPN  ?Cardiac Risk Factors include: advanced age (>42mn, >>65women);hypertension;diabetes mellitus ? ?   ?Objective:  ?  ?Today's Vitals  ? 12/01/21 1322  ?Weight: 213 lb (96.6 kg)  ?Height: '5\' 3"'$  (1.6 m)  ? ?Body mass index is 37.73 kg/m?. ? ? ?  12/01/2021  ?  1:35 PM 06/02/2015  ?  2:25 PM 06/02/2015  ?  8:31 AM  ?Advanced Directives  ?Does Patient Have a Medical Advance Directive? No No No  ?Would patient like information on creating a medical advance directive? No - Patient declined No - patient declined information   ? ? ?Current Medications (verified) ?Outpatient Encounter Medications as of 12/01/2021  ?Medication Sig  ? Alirocumab (PRALUENT) 75 MG/ML SOAJ Inject 1 mL into the skin every 14 (fourteen) days.  ? aspirin EC 81 MG EC tablet Take 1 tablet (81 mg total) by mouth daily.  ? b complex vitamins tablet Take 1 tablet by mouth every other day.  ? carvedilol (COREG) 6.25 MG tablet Take 1  tablet (6.25 mg total) by mouth 2 (two) times daily with a meal.  ? fluticasone (FLONASE) 50 MCG/ACT nasal spray Place 2 sprays into both nostrils as needed for allergies or rhinitis.  ? hydrochlorothiazide (HYDRODIURIL) 25 MG tablet Take 0.5 tablets (12.5 mg total) by mouth daily.  ? hydroxypropyl methylcellulose / hypromellose (ISOPTO TEARS / GONIOVISC) 2.5 % ophthalmic solution Place 1 drop into both eyes as needed for dry eyes.  ? lisinopril (ZESTRIL) 10 MG tablet TAKE 1 TABLET BY MOUTH EVERY MORNING AND AT BEDTIME  ? nitroGLYCERIN (NITROSTAT) 0.4 MG SL tablet Place 1 tablet (0.4 mg total) under the tongue every 5 (five) minutes x 3 doses as needed for chest pain.  ? ?No facility-administered encounter medications on file as of 12/01/2021.  ? ? ?Allergies (verified) ?Atorvastatin  ? ?History: ?Past Medical History:  ?Diagnosis Date  ? Allergy   ? CAD (coronary artery disease)   ? cath 06/02/2015 25% mid RCA, 25% prox LCx, 100% prox LAD treated with DES (3.0 x 15 mm Xience), EF 40%  ? Chicken pox   ? Former smoker   ? Heart disease   ? Hyperlipidemia   ? Hypertension   ? Mild ichemic cardiomyopathy, EF 45-50% post cath 06/2015 06/04/2015  ? NSTEMI (non-ST elevated myocardial infarction) ([facility] 06/02/2015  ? Type 2 diabetes mellitus without complication ([facility] [id number] ? ?Past Surgical History:  ?Procedure Laterality Date  ? CARDIAC CATHETERIZATION N/A 06/02/2015  ? Procedure: Left Heart Cath and Coronary Angiography;  Surgeon: Sherren Mocha, MD;  Location: [facility];  Service: Cardiovascular;  Laterality: N/A;  ? CHOLECYSTECTOMY    ? GALLBLADDER SURGERY    ? TUBAL LIGATION    ? ?Family History  ?Problem Relation Age of Onset  ? Heart attack Father   ? Valvular heart disease Mother   ? Breast cancer Mother   ?     age 81  ? ?Social History  ? ?Socioeconomic History  ? Marital status: Widowed  ?  Spouse name: Not on file  ? Number of children: Not on file  ? Years of education: Not on file  ? Highest education  level: Not on file  ?Occupational History  ? Not on file  ?Tobacco Use  ? Smoking status: Former  ? Smokeless tobacco: Never  ? Tobacco comments:  ?  quit when she was 77s  ?Substance and Sexual Activity  ? Alcohol use: No  ?  Alcohol/week: 0.0 standard drinks  ? Drug use: No  ? Sexual activity: Yes  ?  Birth control/protection: None  ?Other Topics Concern  ? Not on file  ?Social History Narrative  ? Not on file  ? ?Social Determinants of Health  ? ?Financial Resource Strain: Low Risk   ? Difficulty of Paying Living Expenses: Not hard at all  ?Food Insecurity: No Food Insecurity  ? Worried About Charity fundraiser in the Last Year: Never true  ? Ran Out of Food in the Last Year: Never true  ?Transportation Needs: No Transportation Needs  ? Lack of Transportation (Medical): No  ? Lack of Transportation (Non-Medical): No  ?Physical Activity: Sufficiently Active  ? Days of Exercise per Week: 5 days  ? Minutes of Exercise per Session: 30 min  ?Stress: No Stress Concern Present  ? Feeling of Stress : Not at all  ?Social Connections: Socially Isolated  ? Frequency of Communication with Friends and Family: More than three times a week  ? Frequency of Social Gatherings with Friends and Family: More than three times a week  ? Attends Religious Services: Never  ? Active Member of Clubs or Organizations: No  ? Attends Archivist Meetings: Never  ? Marital Status: Widowed  ? ? ?Tobacco Counseling ?Counseling given: Not Answered ?Tobacco comments: quit when she was 56s ? ? ?Clinical Intake: ? ?Pre-visit preparation completed: NoNutrition Risk Assessment: ? ?Has the patient had any N/V/D within the last 2 months?  No  ?Does the patient have any non-healing wounds?  No  ?Has the patient had any unintentional weight loss or weight gain?  No  ? ?Diabetes: ? ?Is the patient diabetic?  Yes Pre Diabetic ?If diabetic, was a CBG obtained today?  No  ?Did the patient bring in their glucometer from home?  No Audio Visit ?How  often do you monitor your CBG's? N/A.  ? ?Financial Strains and Diabetes Management: ? ?Are you having any financial strains with the device, your supplies or your medication? No .  ?Does the patient want to be seen by Chronic Care Management for management of their diabetes?  No  ?Would the patient like to be referred to a Nutritionist or for Diabetic Management?  No  ? ?Diabetic Exams: ? ?Diabetic Eye Exam: Completed . Overdue for diabetic eye exam. Pt has been advised about the importance in completing this exam. A referral has been placed today. Message sent to referral coordinator for scheduling purposes. Advised pt to expect a call from office referred to regarding appt. ? ?  Diabetic Foot Exam: Completed Yes. Pt has been advised about the importance in completing this exam. Pt is scheduled for diabetic foot exam on Followed by PCP.   ? ?Diabetic?  Yes ? ?Interpreter Needed?: NoActivities of Daily Living ? ?  12/01/2021  ?  1:34 PM  ?In your present state of health, do you have any difficulty performing the following activities:  ?Hearing? 0  ?Vision? 0  ?Difficulty concentrating or making decisions? 0  ?Walking or climbing stairs? 0  ?Dressing or bathing? 0  ?Preparing Food and eating ? N  ?Using the Toilet? N  ?In the past six months, have you accidently leaked urine? N  ?Do you have problems with loss of bowel control? N  ?Managing your Medications? N  ?Managing your Finances? N  ?Housekeeping or managing your Housekeeping? N  ? ? ?Patient Care Team: ?Martinique, Betty G, MD as PCP - General (Family Medicine) ?Sherren Mocha, MD as PCP - Cardiology (Cardiology) ?Earnie Larsson, [facility] as Pharmacist (Pharmacist) ? ?Indicate any recent Medical Services you may have received from other than [facility] providers in the past year (date may be approximate). ? ?   ?Assessment:  ? This is a routine wellness examination for Kei. ? ?Hearing/Vision screen ?Hearing Screening - Comments:: No hearing aids ?Vision Screening -  Comments:: Wears glasses followed [facility] ? ?Dietary issues and exercise activities discussed: ?Exercise limited by: None identified ? ? Goals Addressed   ? ?  ?  ?  ?  ?  ? This Visit's Progress  ?   Ex

## 2021-12-01 NOTE — Patient Instructions (Signed)
?Miranda Wong , ?Thank you for taking time to come for your Medicare Wellness Visit. I appreciate your ongoing commitment to your health goals. Please review the following plan we discussed and let me know if I can assist you in the future.  ? ?These are the goals we discussed: ? Goals   ? ?   Exercise 3x per week (30 min per time) (pt-stated)   ?   Continue walking daily for 15-30 min as tolerated. ?  ? ?  ?  ?This is a list of the screening recommended for you and due dates:  ?Health Maintenance  ?Topic Date Due  ? Complete foot exam   10/04/2021  ? Hemoglobin A1C  10/05/2021  ? Eye exam for diabetics  12/01/2021*  ? COVID-19 Vaccine (4 - Booster for Pfizer series) 12/17/2021*  ? Zoster (Shingles) Vaccine (1 of 2) 03/02/2022*  ? Tetanus Vaccine  12/02/2022*  ? Flu Shot  03/31/2022  ? Mammogram  02/05/2023  ? Cologuard (Stool DNA test)  11/17/2024  ? Pneumonia Vaccine  Completed  ? DEXA scan (bone density measurement)  Completed  ? HPV Vaccine  Aged Out  ? Hepatitis C Screening: USPSTF Recommendation to screen - Ages 61-79 yo.  Discontinued  ?*Topic was postponed. The date shown is not the original due date.  ? ?Advanced directives: No ? ?Conditions/risks identified: None ? ?Next appointment: Follow up in one year for your annual wellness visit  ? ? ?Preventive Care 72 Years and Older, Female ?Preventive care refers to lifestyle choices and visits with your health care provider that can promote health and wellness. ?What does preventive care include? ?A yearly physical exam. This is also called an annual well check. ?Dental exams once or twice a year. ?Routine eye exams. Ask your health care provider how often you should have your eyes checked. ?Personal lifestyle choices, including: ?Daily care of your teeth and gums. ?Regular physical activity. ?Eating a healthy diet. ?Avoiding tobacco and drug use. ?Limiting alcohol use. ?Practicing safe sex. ?Taking low-dose aspirin every day. ?Taking vitamin and mineral  supplements as recommended by your health care provider. ?What happens during an annual well check? ?The services and screenings done by your health care provider during your annual well check will depend on your age, overall health, lifestyle risk factors, and family history of disease. ?Counseling  ?Your health care provider may ask you questions about your: ?Alcohol use. ?Tobacco use. ?Drug use. ?Emotional well-being. ?Home and relationship well-being. ?Sexual activity. ?Eating habits. ?History of falls. ?Memory and ability to understand (cognition). ?Work and work Statistician. ?Reproductive health. ?Screening  ?You may have the following tests or measurements: ?Height, weight, and BMI. ?Blood pressure. ?Lipid and cholesterol levels. These may be checked every 5 years, or more frequently if you are over 29 years old. ?Skin check. ?Lung cancer screening. You may have this screening every year starting at age 40 if you have a 30-pack-year history of smoking and currently smoke or have quit within the past 15 years. ?Fecal occult blood test (FOBT) of the stool. You may have this test every year starting at age 11. ?Flexible sigmoidoscopy or colonoscopy. You may have a sigmoidoscopy every 5 years or a colonoscopy every 10 years starting at age 51. ?Hepatitis C blood test. ?Hepatitis B blood test. ?Sexually transmitted disease (STD) testing. ?Diabetes screening. This is done by checking your blood sugar (glucose) after you have not eaten for a while (fasting). You may have this done every 1-3 years. ?Bone density scan.  This is done to screen for osteoporosis. You may have this done starting at age 22. ?Mammogram. This may be done every 1-2 years. Talk to your health care provider about how often you should have regular mammograms. ?Talk with your health care provider about your test results, treatment options, and if necessary, the need for more tests. ?Vaccines  ?Your health care provider may recommend certain  vaccines, such as: ?Influenza vaccine. This is recommended every year. ?Tetanus, diphtheria, and acellular pertussis (Tdap, Td) vaccine. You may need a Td booster every 10 years. ?Zoster vaccine. You may need this after age 65. ?Pneumococcal 13-valent conjugate (PCV13) vaccine. One dose is recommended after age 62. ?Pneumococcal polysaccharide (PPSV23) vaccine. One dose is recommended after age 74. ?Talk to your health care provider about which screenings and vaccines you need and how often you need them. ?This information is not intended to replace advice given to you by your health care provider. Make sure you discuss any questions you have with your health care provider. ?Document Released: 09/13/2015 Document Revised: 05/06/2016 Document Reviewed: 06/18/2015 ?Elsevier Interactive Patient Education ? 2017 [place]. ? ?Fall Prevention in the Home ?Falls can cause injuries. They can happen to people of all ages. There are many things you can do to make your home safe and to help prevent falls. ?What can I do on the outside of my home? ?Regularly fix the edges of walkways and driveways and fix any cracks. ?Remove anything that might make you trip as you walk through a door, such as a raised step or threshold. ?Trim any bushes or trees on the path to your home. ?Use bright outdoor lighting. ?Clear any walking paths of anything that might make someone trip, such as rocks or tools. ?Regularly check to see if handrails are loose or broken. Make sure that both sides of any steps have handrails. ?Any raised decks and porches should have guardrails on the edges. ?Have any leaves, snow, or ice cleared regularly. ?Use sand or salt on walking paths during winter. ?Clean up any spills in your garage right away. This includes oil or grease spills. ?What can I do in the bathroom? ?Use night lights. ?Install grab bars by the toilet and in the tub and shower. Do not use towel bars as grab bars. ?Use non-skid mats or decals in  the tub or shower. ?If you need to sit down in the shower, use a plastic, non-slip stool. ?Keep the floor dry. Clean up any water that spills on the floor as soon as it happens. ?Remove soap buildup in the tub or shower regularly. ?Attach bath mats securely with double-sided non-slip rug tape. ?Do not have throw rugs and other things on the floor that can make you trip. ?What can I do in the bedroom? ?Use night lights. ?Make sure that you have a light by your bed that is easy to reach. ?Do not use any sheets or blankets that are too big for your bed. They should not hang down onto the floor. ?Have a firm chair that has side arms. You can use this for support while you get dressed. ?Do not have throw rugs and other things on the floor that can make you trip. ?What can I do in the kitchen? ?Clean up any spills right away. ?Avoid walking on wet floors. ?Keep items that you use a lot in easy-to-reach places. ?If you need to reach something above you, use a strong step stool that has a grab bar. ?Keep electrical cords  out of the way. ?Do not use floor polish or wax that makes floors slippery. If you must use wax, use non-skid floor wax. ?Do not have throw rugs and other things on the floor that can make you trip. ?What can I do with my stairs? ?Do not leave any items on the stairs. ?Make sure that there are handrails on both sides of the stairs and use them. Fix handrails that are broken or loose. Make sure that handrails are as long as the stairways. ?Check any carpeting to make sure that it is firmly attached to the stairs. Fix any carpet that is loose or worn. ?Avoid having throw rugs at the top or bottom of the stairs. If you do have throw rugs, attach them to the floor with carpet tape. ?Make sure that you have a light switch at the top of the stairs and the bottom of the stairs. If you do not have them, ask someone to add them for you. ?What else can I do to help prevent falls? ?Wear shoes that: ?Do not have high  heels. ?Have rubber bottoms. ?Are comfortable and fit you well. ?Are closed at the toe. Do not wear sandals. ?If you use a stepladder: ?Make sure that it is fully opened. Do not climb a closed stepladder. ?Make su

## 2021-12-17 ENCOUNTER — Ambulatory Visit (INDEPENDENT_AMBULATORY_CARE_PROVIDER_SITE_OTHER): Payer: Medicare Other | Admitting: Physician Assistant

## 2021-12-17 ENCOUNTER — Encounter: Payer: Self-pay | Admitting: Physician Assistant

## 2021-12-17 VITALS — BP 124/70 | HR 67 | Ht 63.0 in | Wt 219.5 lb

## 2021-12-17 DIAGNOSIS — R195 Other fecal abnormalities: Secondary | ICD-10-CM | POA: Diagnosis not present

## 2021-12-17 DIAGNOSIS — I255 Ischemic cardiomyopathy: Secondary | ICD-10-CM | POA: Diagnosis not present

## 2021-12-17 MED ORDER — NA SULFATE-K SULFATE-MG SULF 17.5-3.13-1.6 GM/177ML PO SOLN: 1.0000 | Freq: Once | ORAL | 0 refills | Status: AC

## 2021-12-17 NOTE — Patient Instructions (Signed)
You have been scheduled for a colonoscopy. Please follow written instructions given to you at your visit today.  ?Please pick up your prep supplies at the pharmacy within the next 1-3 days. ?If you use inhalers (even only as needed), please bring them with you on the day of your procedure. ? ?If you are age 74 or older, your body mass index should be between 23-30. Your Body mass index is 38.88 kg/m?Marland Kitchen If this is out of the aforementioned range listed, please consider follow up with your Primary Care Provider. ?________________________________________________________ ? ?The [facility] providers would like to encourage you to use [facility] to communicate with providers for non-urgent requests or questions.  Due to long hold times on the telephone, sending your provider a message by [facility] may be a faster and more efficient way to get a response.  Please allow 48 business hours for a response.  Please remember that this is for non-urgent requests.  ?_______________________________________________________ ? ?

## 2021-12-17 NOTE — Progress Notes (Signed)
? ?Chief Complaint: Positive Cologuard ? ?HPI: ?   [name] is a 76 year old female with past medical history as listed below including CAD and ischemic cardiomyopathy (10/10/2015 echo with an LVEF 65-70%), who was referred to me by [place], Betty G, MD for a complaint of positive Cologuard. ?   11/17/2021 positive Cologuard.  (Previously - 4 years ago) ?   Today, the patient tells me that she recently had a positive Cologuard test.  She has been trying to read up on this to figure out what it actually means.  Tells me that her family has a history of colon polyps but no colon cancer.  She denies any GI complaints or concerns. ?   Denies fever, chills, weight loss, blood in her stool or change in bowel habits. ? ?Past Medical History:  ?Diagnosis Date  ? Allergy   ? CAD (coronary artery disease)   ? cath 06/02/2015 25% mid RCA, 25% prox LCx, 100% prox LAD treated with DES (3.0 x 15 mm Xience), EF 40%  ? Chicken pox   ? Former smoker   ? Heart disease   ? Hyperlipidemia   ? Hypertension   ? Mild ichemic cardiomyopathy, EF 45-50% post cath 06/2015 06/04/2015  ? NSTEMI (non-ST elevated myocardial infarction) ([place]) 06/02/2015  ? Type 2 diabetes mellitus without complication ([place]) 30/16/0109  ? ? ?Past Surgical History:  ?Procedure Laterality Date  ? CARDIAC CATHETERIZATION N/A 06/02/2015  ? Procedure: Left Heart Cath and Coronary Angiography;  Surgeon: Sherren Mocha, MD;  Location: [facility];  Service: Cardiovascular;  Laterality: N/A;  ? CHOLECYSTECTOMY    ? GALLBLADDER SURGERY    ? TUBAL LIGATION    ? ? ?Current Outpatient Medications  ?Medication Sig Dispense Refill  ? Alirocumab (PRALUENT) 75 MG/ML SOAJ Inject 1 mL into the skin every 14 (fourteen) days. 2 mL 11  ? aspirin EC 81 MG EC tablet Take 1 tablet (81 mg total) by mouth daily.    ? b complex vitamins tablet Take 1 tablet by mouth every other day.    ? carvedilol (COREG) 6.25 MG tablet Take 1 tablet (6.25 mg total) by mouth 2 (two) times daily with a  meal. 180 tablet 3  ? fluticasone (FLONASE) 50 MCG/ACT nasal spray Place 2 sprays into both nostrils as needed for allergies or rhinitis.    ? hydrochlorothiazide (HYDRODIURIL) 25 MG tablet Take 0.5 tablets (12.5 mg total) by mouth daily. 45 tablet 3  ? hydroxypropyl methylcellulose / hypromellose (ISOPTO TEARS / GONIOVISC) 2.5 % ophthalmic solution Place 1 drop into both eyes as needed for dry eyes.    ? lisinopril (ZESTRIL) 10 MG tablet TAKE 1 TABLET BY MOUTH EVERY MORNING AND AT BEDTIME 180 tablet 2  ? nitroGLYCERIN (NITROSTAT) 0.4 MG SL tablet Place 1 tablet (0.4 mg total) under the tongue every 5 (five) minutes x 3 doses as needed for chest pain. 25 tablet 3  ? ?No current facility-administered medications for this visit.  ? ? ?Allergies as of 12/17/2021 - Review Complete 12/01/2021  ?Allergen Reaction Noted  ? Atorvastatin Other (See Comments) 09/05/2020  ? ? ?Family History  ?Problem Relation Age of Onset  ? Heart attack Father   ? Valvular heart disease Mother   ? Breast cancer Mother   ?     age 69  ? ? ?Social History  ? ?Socioeconomic History  ? Marital status: Widowed  ?  Spouse name: Not on file  ? Number of children: Not on file  ?  Years of education: Not on file  ? Highest education level: Not on file  ?Occupational History  ? Not on file  ?Tobacco Use  ? Smoking status: Former  ? Smokeless tobacco: Never  ? Tobacco comments:  ?  quit when she was 39s  ?Substance and Sexual Activity  ? Alcohol use: No  ?  Alcohol/week: 0.0 standard drinks  ? Drug use: No  ? Sexual activity: Yes  ?  Birth control/protection: None  ?Other Topics Concern  ? Not on file  ?Social History Narrative  ? Not on file  ? ?Social Determinants of Health  ? ?Financial Resource Strain: Low Risk   ? Difficulty of Paying Living Expenses: Not hard at all  ?Food Insecurity: No Food Insecurity  ? Worried About Charity fundraiser in the Last Year: Never true  ? Ran Out of Food in the Last Year: Never true  ?Transportation Needs: No  Transportation Needs  ? Lack of Transportation (Medical): No  ? Lack of Transportation (Non-Medical): No  ?Physical Activity: Sufficiently Active  ? Days of Exercise per Week: 5 days  ? Minutes of Exercise per Session: 30 min  ?Stress: No Stress Concern Present  ? Feeling of Stress : Not at all  ?Social Connections: Socially Isolated  ? Frequency of Communication with Friends and Family: More than three times a week  ? Frequency of Social Gatherings with Friends and Family: More than three times a week  ? Attends Religious Services: Never  ? Active Member of Clubs or Organizations: No  ? Attends Archivist Meetings: Never  ? Marital Status: Widowed  ?Intimate Partner Violence: Not At Risk  ? Fear of Current or Ex-Partner: No  ? Emotionally Abused: No  ? Physically Abused: No  ? Sexually Abused: No  ? ? ?Review of Systems:    ?Constitutional: No weight loss, fever or chills ?Skin: No rash ?Cardiovascular: No chest pain ?Respiratory: No SOB ?Gastrointestinal: See HPI and otherwise negative ?Genitourinary: No dysuria or change in urinary frequency ?Neurological: No headache, dizziness or syncope ?Musculoskeletal: No new muscle or joint pain ?Hematologic: No bleeding  ?Psychiatric: No history of depression or anxiety ? ? Physical Exam:  ?Vital signs: ?BP 124/70   Pulse 67   Ht '5\' 3"'$  (1.6 m)   Wt 219 lb 8 oz (99.6 kg)   BMI 38.88 kg/m?   ? ?Constitutional:   Pleasant Caucasian female appears to be in NAD, Well developed, Well nourished, alert and cooperative ?Head:  Normocephalic and atraumatic. ?Eyes:   PEERL, EOMI. No icterus. Conjunctiva pink. ?Ears:  Normal auditory acuity. ?Neck:  Supple ?Throat: Oral cavity and pharynx without inflammation, swelling or lesion.  ?Respiratory: Respirations even and unlabored. Lungs clear to auscultation bilaterally.   No wheezes, crackles, or rhonchi.  ?Cardiovascular: Normal S1, S2. No MRG. Regular rate and rhythm. No peripheral edema, cyanosis or pallor.   ?Gastrointestinal:  Soft, nondistended, nontender. No rebound or guarding. Normal bowel sounds. No appreciable masses or hepatomegaly. ?Rectal:  Not performed.  ?Msk:  Symmetrical without gross deformities. Without edema, no deformity or joint abnormality.  ?Neurologic:  Alert and  oriented x4;  grossly normal neurologically.  ?Skin:   Dry and intact without significant lesions or rashes. ?Psychiatric:  Demonstrates good judgement and reason without abnormal affect or behaviors. ? ?RELEVANT LABS AND IMAGING: ?CBC ?   ?Component Value Date/Time  ? WBC 4.6 05/15/2021 1314  ? RBC 4.73 05/15/2021 1314  ? HGB 15.4 (H) 05/15/2021 1314  ?  HCT 44.3 05/15/2021 1314  ? PLT 134.0 (L) 05/15/2021 1314  ? MCV 93.7 05/15/2021 1314  ? MCH 30.9 06/03/2015 0050  ? MCHC 34.7 05/15/2021 1314  ? RDW 13.3 05/15/2021 1314  ? LYMPHSABS 1.2 05/15/2021 1314  ? MONOABS 0.4 05/15/2021 1314  ? EOSABS 0.3 05/15/2021 1314  ? BASOSABS 0.0 05/15/2021 1314  ? ? ?CMP  ?   ?Component Value Date/Time  ? NA 138 04/04/2021 1057  ? NA 138 08/21/2020 1153  ? K 3.9 04/04/2021 1057  ? CL 101 04/04/2021 1057  ? CO2 29 04/04/2021 1057  ? GLUCOSE 130 (H) 04/04/2021 1057  ? BUN 10 04/04/2021 1057  ? BUN 9 08/21/2020 1153  ? CREATININE 1.03 04/04/2021 1057  ? CREATININE 1.05 (H) 05/26/2016 1051  ? CALCIUM 9.6 04/04/2021 1057  ? PROT 7.4 04/04/2021 1057  ? PROT 6.8 08/21/2020 1153  ? ALBUMIN 3.9 04/04/2021 1057  ? ALBUMIN 4.1 08/21/2020 1153  ? AST 66 (H) 04/04/2021 1057  ? ALT 40 (H) 04/04/2021 1057  ? ALKPHOS 108 04/04/2021 1057  ? BILITOT 1.4 (H) 04/04/2021 1057  ? BILITOT 0.7 08/21/2020 1153  ? GFRNONAA 66 08/21/2020 1153  ? GFRAA 76 08/21/2020 1153  ? ? ?Assessment: ?1.  Positive Cologuard: No other GI symptoms ? ?Plan: ?1.  Scheduled patient for diagnostic colonoscopy in the [facility].  Patient requested a female physician and was scheduled with Dr. Lorenso Courier.  Also had specific timeframe in which she wanted this done.  Did provide the patient a detailed list of  risks for the procedure and she agrees to proceed. Patient is appropriate for endoscopic procedure(s) in the ambulatory ([place]) setting.  ?2.  Discussed what a positive Cologuard means with the patient and answered her questions

## 2021-12-17 NOTE — Progress Notes (Signed)
I agree with the assessment and plan as outlined by Miranda Wong. 

## 2021-12-18 ENCOUNTER — Other Ambulatory Visit: Payer: Self-pay | Admitting: Family Medicine

## 2021-12-18 DIAGNOSIS — Z1231 Encounter for screening mammogram for malignant neoplasm of breast: Secondary | ICD-10-CM

## 2022-01-20 NOTE — Progress Notes (Unsigned)
HPI: Ms.Lasondra T Devora is a 76 y.o. female, who is here today for follow up.  Last AWV: 12/01/21. She was last seen on 04/04/21. She has seen GI due to positive cologuard. Colonoscopy scheduled for 02/04/2022. Since her last visit she has also followed with her cardiologist, 07/2021.  Diabetes Mellitus II: Diagnosed in 07/2018. - Checking BG at home: She is not checking. She is on nonpharmacologic treatment. She walks her dog daily for about 30 minutes and active doing yard work. She has not been consistent with following a healthful diet, she does not like vegetables in general. - eye exam: Current.  Had cataract surgery in 12//2022 and 1//2023. - foot exam: due - Negative for symptoms of hypoglycemia, polyuria, polydipsia, numbness extremities, foot ulcers/trauma  Lab Results  Component Value Date   HGBA1C 6.0 (A) 04/04/2021   Lab Results  Component Value Date   MICROALBUR 1.5 10/04/2020   Hypertension:  Medications: Lisinopril 10 mg daily, HCTZ 25 mg 1/2 tablet daily, and carvedilol 6.25 mg twice daily. BP readings at home: Not checking. Side effects: None. She takes Aspirin 81 q 2 days because it causes GI symptoms. Negative for unusual or severe headache, visual changes, exertional chest pain, dyspnea,  focal weakness, or edema. Renal function has been mildly abnormal for the past few years, stable. She has not noted gross hematuria, foamy urine, or decreased urine output.  Lab Results  Component Value Date   CREATININE 1.03 04/04/2021   BUN 10 04/04/2021   NA 138 04/04/2021   K 3.9 04/04/2021   CL 101 04/04/2021   CO2 29 04/04/2021   Hyperlipidemia: Currently she is on Praluent 75 mg every 2 weeks. She did not tolerate statins.  Lab Results  Component Value Date   CHOL 121 11/13/2020   HDL 60 11/13/2020   LDLCALC 43 11/13/2020   TRIG 97 11/13/2020   CHOLHDL 2.0 11/13/2020  History of abnormal LFTs. Liver US done in 10/2020: Hepatic steatosis. Subcentimeter  left hepatic lesion may reflect a hemangioma. Post cholecystectomy sequela.  She has not drunken any alcohol in about a month. Negative for abdominal pain, nausea, melena, or jaundice.  Component     Latest Ref Rng 04/04/2021  Total Protein     6.0 - 8.3 g/dL 7.4   Albumin     3.5 - 5.2 g/dL 3.9   Total Bilirubin     0.2 - 1.2 mg/dL 1.4 (H)   Alkaline Phosphatase     39 - 117 U/L 108   AST     0 - 37 U/L 66 (H)   ALT     0 - 35 U/L 40 (H)   Bilirubin, Direct     0.0 - 0.3 mg/dL 0.3    Platelets have been mildly low in the past. 139,000 04/04/21. She has not noted more bruising than usual.  Lab Results  Component Value Date   WBC 4.6 05/15/2021   HGB 15.4 (H) 05/15/2021   HCT 44.3 05/15/2021   MCV 93.7 05/15/2021   PLT 134.0 (L) 05/15/2021   Review of Systems  Constitutional:  Negative for activity change, appetite change and fever.  HENT:  Negative for mouth sores, nosebleeds and trouble swallowing.   Respiratory:  Negative for cough and wheezing.   Gastrointestinal:  Negative for vomiting.       Negative for changes in bowel habits.  Musculoskeletal:  Negative for gait problem and myalgias.  Neurological:  Negative for syncope, facial asymmetry and weakness.  Psychiatric/Behavioral:  Negative for behavioral problems and confusion.    Current Outpatient Medications on File Prior to Visit  Medication Sig Dispense Refill   Alirocumab (PRALUENT) 75 MG/ML SOAJ Inject 1 mL into the skin every 14 (fourteen) days. 2 mL 11   Ascorbic Acid (VITAMIN C PO) Take 1 tablet by mouth as needed.     aspirin EC 81 MG EC tablet Take 1 tablet (81 mg total) by mouth daily.     b complex vitamins tablet Take 1 tablet by mouth every other day.     carvedilol (COREG) 6.25 MG tablet Take 1 tablet (6.25 mg total) by mouth 2 (two) times daily with a meal. 180 tablet 3   fluticasone (FLONASE) 50 MCG/ACT nasal spray Place 2 sprays into both nostrils as needed for allergies or rhinitis.      hydrochlorothiazide (HYDRODIURIL) 25 MG tablet Take 0.5 tablets (12.5 mg total) by mouth daily. 45 tablet 3   hydroxypropyl methylcellulose / hypromellose (ISOPTO TEARS / GONIOVISC) 2.5 % ophthalmic solution Place 1 drop into both eyes as needed for dry eyes.     lisinopril (ZESTRIL) 10 MG tablet TAKE 1 TABLET BY MOUTH EVERY MORNING AND AT BEDTIME 180 tablet 2   nitroGLYCERIN (NITROSTAT) 0.4 MG SL tablet Place 1 tablet (0.4 mg total) under the tongue every 5 (five) minutes x 3 doses as needed for chest pain. 25 tablet 3   VITAMIN D PO Take 1 tablet by mouth as needed.     No current facility-administered medications on file prior to visit.   Past Medical History:  Diagnosis Date   Allergy    CAD (coronary artery disease)    cath 06/02/2015 25% mid RCA, 25% prox LCx, 100% prox LAD treated with DES (3.0 x 15 mm Xience), EF 40%   Chicken pox    Former smoker    Heart disease    Hyperlipidemia    Hypertension    Mild ichemic cardiomyopathy, EF 45-50% post cath 06/2015 06/04/2015   NSTEMI (non-ST elevated myocardial infarction) ([place]) 06/02/2015   Type 2 diabetes mellitus without complication ([place]) 65/46/5035   Past Surgical History:  Procedure Laterality Date   CARDIAC CATHETERIZATION N/A 06/02/2015   Procedure: Left Heart Cath and Coronary Angiography;  Surgeon: Sherren Mocha, MD;  Location: [facility];  Service: Cardiovascular;  Laterality: N/A;   CHOLECYSTECTOMY     GALLBLADDER SURGERY     TUBAL LIGATION     Allergies  Allergen Reactions   Atorvastatin Other (See Comments)    myalgia   Family History  Problem Relation Age of Onset   Valvular heart disease Mother    Breast cancer Mother        age 37   Colon polyps Mother    Irritable bowel syndrome Mother    GER disease Mother    Ulcers Mother    Heart attack Father    Colon polyps Father    Throat cancer Maternal Uncle    Colon cancer Neg Hx    Social History   Socioeconomic History   Marital status: Widowed     Spouse name: Not on file   Number of children: Not on file   Years of education: Not on file   Highest education level: Not on file  Occupational History   Not on file  Tobacco Use   Smoking status: Former   Smokeless tobacco: Never   Tobacco comments:    quit when she was 50s  Vaping Use  Vaping Use: Never used  Substance and Sexual Activity   Alcohol use: No    Comment: ocassionally   Drug use: No   Sexual activity: Yes    Birth control/protection: None  Other Topics Concern   Not on file  Social History Narrative   Not on file   Social Determinants of Health   Financial Resource Strain: Low Risk    Difficulty of Paying Living Expenses: Not hard at all  Food Insecurity: No Food Insecurity   Worried About Charity fundraiser in the Last Year: Never true   [place] in the Last Year: Never true  Transportation Needs: No Transportation Needs   Lack of Transportation (Medical): No   Lack of Transportation (Non-Medical): No  Physical Activity: Sufficiently Active   Days of Exercise per Week: 5 days   Minutes of Exercise per Session: 30 min  Stress: No Stress Concern Present   Feeling of Stress : Not at all  Social Connections: Socially Isolated   Frequency of Communication with Friends and Family: More than three times a week   Frequency of Social Gatherings with Friends and Family: More than three times a week   Attends Religious Services: Never   Marine scientist or Organizations: No   Attends Archivist Meetings: Never   Marital Status: Widowed   Vitals:   01/21/22 0751  BP: 128/76  Pulse: 69  Resp: 16  Temp: 98.4 F (36.9 C)  SpO2: 93%  Body mass index is 39.21 kg/m. Wt Readings from Last 3 Encounters:  01/21/22 221 lb 6 oz (100.4 kg)  12/17/21 219 lb 8 oz (99.6 kg)  12/01/21 213 lb (96.6 kg)   Physical Exam Vitals and nursing note reviewed.  Constitutional:      General: She is not in acute distress.    Appearance: She is  well-developed.  HENT:     Head: Normocephalic and atraumatic.     Mouth/Throat:     Mouth: Mucous membranes are moist.     Dentition: Has dentures.     Pharynx: Oropharynx is clear.  Eyes:     Conjunctiva/sclera: Conjunctivae normal.  Cardiovascular:     Rate and Rhythm: Normal rate and regular rhythm.     Pulses:          Dorsalis pedis pulses are 2+ on the right side and 2+ on the left side.     Heart sounds: No murmur heard. Pulmonary:     Effort: Pulmonary effort is normal. No respiratory distress.     Breath sounds: Normal breath sounds.  Abdominal:     Palpations: Abdomen is soft. There is no hepatomegaly or mass.     Tenderness: There is no abdominal tenderness.  Lymphadenopathy:     Cervical: No cervical adenopathy.  Skin:    General: Skin is warm.     Findings: No erythema or rash.  Neurological:     General: No focal deficit present.     Mental Status: She is alert and oriented to person, place, and time.     Cranial Nerves: No cranial nerve deficit.     Gait: Gait normal.  Psychiatric:     Comments: Well groomed, good eye contact.   Diabetic Foot Exam - Simple   Simple Foot Form Diabetic Foot exam was performed with the following findings: Yes 01/21/2022  8:44 AM  Visual Inspection No deformities, no ulcerations, no other skin breakdown bilaterally: Yes Sensation Testing Intact to touch and  monofilament testing bilaterally: Yes Pulse Check Posterior Tibialis and Dorsalis pulse intact bilaterally: Yes Comments    ASSESSMENT AND PLAN:  Ms. KENLYN LOSE was here today annual physical examination.  Orders Placed This Encounter  Procedures   US Abdomen Limited RUQ (LIVER/GB)   Microalbumin / creatinine urine ratio   Basic metabolic panel   Hepatic function panel   Hemoglobin A1c   CBC   Lipid panel   VITAMIN D 25 Hydroxy (Vit-D Deficiency, Fractures)   Lab Results  Component Value Date   HGBA1C 7.8 (H) 01/21/2022   Lab Results  Component Value  Date   CREATININE 0.94 01/21/2022   BUN 7 01/21/2022   NA 140 01/21/2022   K 4.0 01/21/2022   CL 102 01/21/2022   CO2 28 01/21/2022   Lab Results  Component Value Date   ALT 41 (H) 01/21/2022   AST 64 (H) 01/21/2022   ALKPHOS 76 01/21/2022   BILITOT 1.2 01/21/2022   Lab Results  Component Value Date   CHOL 138 01/21/2022   HDL 55.40 01/21/2022   LDLCALC 56 01/21/2022   TRIG 134.0 01/21/2022   CHOLHDL 2 01/21/2022   Lab Results  Component Value Date   WBC 4.1 01/21/2022   HGB 14.8 01/21/2022   HCT 42.9 01/21/2022   MCV 93.1 01/21/2022   PLT 112.0 (L) 01/21/2022   Lab Results  Component Value Date   MICROALBUR 0.8 01/21/2022   MICROALBUR 1.5 10/04/2020   Type 2 diabetes mellitus with other specified complication ([place]) [id number] has been at goal. Continue nonpharmacologic treatment, further recommendation will be given according to hemoglobin A1c result. Annual eye exam, periodic dental and foot care to continue. F/U in 5-6 months   Thrombocytopenia (HCC) Mild and stable. Further recommendation will be given according to CBC result.  Hyperlipidemia Has not tolerated statins in the past and history of elevated transaminases. Continue Praluent 75 mg every 2 weeks. Further recommendation will be given according to lipid panel results. Following with cardiologist.  Essential hypertension BP adequately controlled. Continue lisinopril 10 mg daily, carvedilol 6.25 mg twice daily, and HCTZ 25 mg 1/2 tablet daily. Low-salt/DASH diet recommended. Eye exam is current.  Stage 3a chronic kidney disease ([place]) We discussed diagnosis. Problem has been stable. Recommend adequate hydration. Continue low-salt diet and avoidance of NSAIDs. Adequate BP and glucose control.  Morbid obesity ([place]) She understands the benefits of wt loss as well as adverse effects of obesity. 03/2021 wt was 207 Lb. Consistency with healthy diet and physical activity encouraged. She does not like  vegetables, she may benefit from vegetable tablets supplementation.   Abnormal LFTs We discussed possible etiologies. Hepatic steatosis (?  NASH). Encouraged to continue avoiding alcohol intake. We will arrange RUQ [place]. Further recommendation will be given according to lab results.  I spent a total of 48 minutes in both face to face and non face to face activities for this visit on the date of this encounter. During this time history was obtained and documented, examination was performed, prior labs/imaging reviewed, and assessment/plan discussed.  Return in about 6 months (around 07/24/2022).  Darrio Bade G. [place], MD  [facility]. [place] office.

## 2022-01-21 ENCOUNTER — Ambulatory Visit (INDEPENDENT_AMBULATORY_CARE_PROVIDER_SITE_OTHER): Payer: Medicare Other | Admitting: Family Medicine

## 2022-01-21 ENCOUNTER — Encounter: Payer: Self-pay | Admitting: Family Medicine

## 2022-01-21 VITALS — BP 128/76 | HR 69 | Temp 98.4°F | Resp 16 | Ht 63.0 in | Wt 221.4 lb

## 2022-01-21 DIAGNOSIS — E782 Mixed hyperlipidemia: Secondary | ICD-10-CM | POA: Diagnosis not present

## 2022-01-21 DIAGNOSIS — E1169 Type 2 diabetes mellitus with other specified complication: Secondary | ICD-10-CM

## 2022-01-21 DIAGNOSIS — N1831 Chronic kidney disease, stage 3a: Secondary | ICD-10-CM | POA: Diagnosis not present

## 2022-01-21 DIAGNOSIS — D696 Thrombocytopenia, unspecified: Secondary | ICD-10-CM | POA: Diagnosis not present

## 2022-01-21 DIAGNOSIS — R7989 Other specified abnormal findings of blood chemistry: Secondary | ICD-10-CM | POA: Diagnosis not present

## 2022-01-21 DIAGNOSIS — I255 Ischemic cardiomyopathy: Secondary | ICD-10-CM

## 2022-01-21 DIAGNOSIS — I10 Essential (primary) hypertension: Secondary | ICD-10-CM | POA: Diagnosis not present

## 2022-01-21 DIAGNOSIS — Z0000 Encounter for general adult medical examination without abnormal findings: Secondary | ICD-10-CM

## 2022-01-21 LAB — CBC
HCT: 42.9 % (ref 36.0–46.0)
Hemoglobin: 14.8 g/dL (ref 12.0–15.0)
MCHC: 34.6 g/dL (ref 30.0–36.0)
MCV: 93.1 fl (ref 78.0–100.0)
Platelets: 112 10*3/uL — ABNORMAL LOW (ref 150.0–400.0)
RBC: 4.61 Mil/uL (ref 3.87–5.11)
RDW: 13.1 % (ref 11.5–15.5)
WBC: 4.1 10*3/uL (ref 4.0–10.5)

## 2022-01-21 LAB — BASIC METABOLIC PANEL
BUN: 7 mg/dL (ref 6–23)
CO2: 28 mEq/L (ref 19–32)
Calcium: 9.8 mg/dL (ref 8.4–10.5)
Chloride: 102 mEq/L (ref 96–112)
Creatinine, Ser: 0.94 mg/dL (ref 0.40–1.20)
GFR: 59.99 mL/min — ABNORMAL LOW (ref >60.00)
Glucose, Bld: 164 mg/dL — ABNORMAL HIGH (ref 70–99)
Potassium: 4 mEq/L (ref 3.5–5.1)
Sodium: 140 mEq/L (ref 135–145)

## 2022-01-21 LAB — HEPATIC FUNCTION PANEL
ALT: 41 U/L — ABNORMAL HIGH (ref 0–35)
AST: 64 U/L — ABNORMAL HIGH (ref 0–37)
Albumin: 4 g/dL (ref 3.5–5.2)
Alkaline Phosphatase: 76 U/L (ref 39–117)
Bilirubin, Direct: 0.3 mg/dL (ref 0.0–0.3)
Total Bilirubin: 1.2 mg/dL (ref 0.2–1.2)
Total Protein: 7.2 g/dL (ref 6.0–8.3)

## 2022-01-21 LAB — LIPID PANEL
Cholesterol: 138 mg/dL (ref 0–200)
HDL: 55.4 mg/dL (ref >39.00)
LDL Cholesterol: 56 mg/dL (ref 0–99)
NonHDL: 82.89
Total CHOL/HDL Ratio: 2
Triglycerides: 134 mg/dL (ref 0.0–149.0)
VLDL: 26.8 mg/dL (ref 0.0–40.0)

## 2022-01-21 LAB — HEMOGLOBIN A1C: Hgb A1c MFr Bld: 7.8 % — ABNORMAL HIGH (ref 4.6–6.5)

## 2022-01-21 LAB — MICROALBUMIN / CREATININE URINE RATIO
Creatinine,U: 131.1 mg/dL
Microalb Creat Ratio: 0.6 mg/g (ref 0.0–30.0)
Microalb, Ur: 0.8 mg/dL (ref 0.0–1.9)

## 2022-01-21 LAB — VITAMIN D 25 HYDROXY (VIT D DEFICIENCY, FRACTURES): VITD: 18.13 ng/mL — ABNORMAL LOW (ref 30.00–100.00)

## 2022-01-21 NOTE — Patient Instructions (Addendum)
A few things to remember from today's visit:  Type 2 diabetes mellitus with other specified complication, without long-term current use of insulin ([place]) - Plan: Microalbumin / creatinine urine ratio, Basic metabolic panel, Hemoglobin A1c  Essential hypertension - Plan: CBC  Mixed hyperlipidemia - Plan: Lipid panel  Abnormal LFTs - Plan: Hepatic function panel, CBC  If you need refills please call your pharmacy. Do not use My Chart to request refills or for acute issues that need immediate attention.   No changes today. Will arrange liver ultrasound.  Please be sure medication list is accurate. If a new problem present, please set up appointment sooner than planned today.

## 2022-01-21 NOTE — Assessment & Plan Note (Signed)
We discussed diagnosis. Problem has been stable. Recommend adequate hydration. Continue low-salt diet and avoidance of NSAIDs. Adequate BP and glucose control.

## 2022-01-21 NOTE — Assessment & Plan Note (Signed)
Has not tolerated statins in the past and history of elevated transaminases. Continue Praluent 75 mg every 2 weeks. Further recommendation will be given according to lipid panel results. Following with cardiologist.

## 2022-01-21 NOTE — Assessment & Plan Note (Addendum)
She understands the benefits of wt loss as well as adverse effects of obesity. 03/2021 wt was 207 Lb. Consistency with healthy diet and physical activity encouraged. She does not like vegetables, she may benefit from vegetable tablets supplementation.

## 2022-01-21 NOTE — Assessment & Plan Note (Signed)
HgA1C has been at goal. Continue nonpharmacologic treatment, further recommendation will be given according to hemoglobin A1c result. Annual eye exam, periodic dental and foot care to continue. F/U in 5-6 months

## 2022-01-21 NOTE — Assessment & Plan Note (Signed)
We discussed possible etiologies. Hepatic steatosis (?  NASH). Encouraged to continue avoiding alcohol intake. We will arrange RUQ [place]. Further recommendation will be given according to lab results.

## 2022-01-21 NOTE — Assessment & Plan Note (Signed)
Mild and stable. Further recommendation will be given according to CBC result.

## 2022-01-21 NOTE — Assessment & Plan Note (Signed)
BP adequately controlled. Continue lisinopril 10 mg daily, carvedilol 6.25 mg twice daily, and HCTZ 25 mg 1/2 tablet daily. Low-salt/DASH diet recommended. Eye exam is current. 

## 2022-01-30 ENCOUNTER — Ambulatory Visit
Admission: RE | Admit: 2022-01-30 | Discharge: 2022-01-30 | Disposition: A | Discharge status: Home / Self Care | Payer: Medicare Other | Source: Ambulatory Visit | Attending: Family Medicine | Admitting: Family Medicine

## 2022-01-30 DIAGNOSIS — K769 Liver disease, unspecified: Secondary | ICD-10-CM | POA: Diagnosis not present

## 2022-01-30 DIAGNOSIS — R7989 Other specified abnormal findings of blood chemistry: Secondary | ICD-10-CM

## 2022-01-30 DIAGNOSIS — K760 Fatty (change of) liver, not elsewhere classified: Secondary | ICD-10-CM | POA: Diagnosis not present

## 2022-01-30 DIAGNOSIS — Z9049 Acquired absence of other specified parts of digestive tract: Secondary | ICD-10-CM | POA: Diagnosis not present

## 2022-01-30 DIAGNOSIS — R748 Abnormal levels of other serum enzymes: Secondary | ICD-10-CM | POA: Diagnosis not present

## 2022-02-03 ENCOUNTER — Encounter: Payer: Self-pay | Admitting: Family Medicine

## 2022-02-04 ENCOUNTER — Encounter: Payer: Self-pay | Admitting: Internal Medicine

## 2022-02-04 ENCOUNTER — Ambulatory Visit (AMBULATORY_SURGERY_CENTER): Payer: Medicare Other | Admitting: Internal Medicine

## 2022-02-04 VITALS — BP 108/66 | HR 63 | Temp 98.0°F | Resp 9 | Ht 63.0 in | Wt 219.0 lb

## 2022-02-04 DIAGNOSIS — D124 Benign neoplasm of descending colon: Secondary | ICD-10-CM | POA: Diagnosis not present

## 2022-02-04 DIAGNOSIS — D122 Benign neoplasm of ascending colon: Secondary | ICD-10-CM | POA: Diagnosis not present

## 2022-02-04 DIAGNOSIS — D123 Benign neoplasm of transverse colon: Secondary | ICD-10-CM

## 2022-02-04 DIAGNOSIS — R195 Other fecal abnormalities: Secondary | ICD-10-CM

## 2022-02-04 DIAGNOSIS — Z1211 Encounter for screening for malignant neoplasm of colon: Secondary | ICD-10-CM | POA: Diagnosis not present

## 2022-02-04 DIAGNOSIS — I10 Essential (primary) hypertension: Secondary | ICD-10-CM | POA: Diagnosis not present

## 2022-02-04 DIAGNOSIS — I2510 Atherosclerotic heart disease of native coronary artery without angina pectoris: Secondary | ICD-10-CM | POA: Diagnosis not present

## 2022-02-04 MED ORDER — SODIUM CHLORIDE 0.9 % IV SOLN: 500.0000 mL | Freq: Once | INTRAVENOUS | Status: DC

## 2022-02-04 NOTE — Progress Notes (Signed)
Report to PACU, RN, vss, BBS= Clear.  

## 2022-02-04 NOTE — Progress Notes (Signed)
GASTROENTEROLOGY PROCEDURE H&P NOTE   Primary Care Physician: Martinique, Betty G, MD    Reason for Procedure:   Positive Cologuard   Plan:    Colonoscopy  Patient is appropriate for endoscopic procedure(s) in the ambulatory ([place]) setting.  The nature of the procedure, as well as the risks, benefits, and alternatives were carefully and thoroughly reviewed with the patient. Ample time for discussion and questions allowed. The patient understood, was satisfied, and agreed to proceed.     HPI: Miranda Wong is a 76 y.o. female who presents for colonoscopy for evaluation of positive Cologuard .  Patient was most recently seen in the Gastroenterology Clinic on 12/17/21.  No interval change in medical history since that appointment. Please refer to that note for full details regarding GI history and clinical presentation.   Past Medical History:  Diagnosis Date   Allergy    CAD (coronary artery disease)    cath 06/02/2015 25% mid RCA, 25% prox LCx, 100% prox LAD treated with DES (3.0 x 15 mm Xience), EF 40%   Chicken pox    Former smoker    Heart disease    Hyperlipidemia    Hypertension    Mild ichemic cardiomyopathy, EF 45-50% post cath 06/2015 06/04/2015   NSTEMI (non-ST elevated myocardial infarction) ([place]) 06/02/2015   Type 2 diabetes mellitus without complication ([place]) 94/76/5465    Past Surgical History:  Procedure Laterality Date   CARDIAC CATHETERIZATION N/A 06/02/2015   Procedure: Left Heart Cath and Coronary Angiography;  Surgeon: Sherren Mocha, MD;  Location: [facility];  Service: Cardiovascular;  Laterality: N/A;   CHOLECYSTECTOMY     GALLBLADDER SURGERY     TUBAL LIGATION      Prior to Admission medications   Medication Sig Start Date End Date Taking? Authorizing Provider  carvedilol (COREG) 6.25 MG tablet Take 1 tablet (6.25 mg total) by mouth 2 (two) times daily with a meal. 10/17/21  Yes Sherren Mocha, MD  hydrochlorothiazide (HYDRODIURIL) 25 MG  tablet Take 0.5 tablets (12.5 mg total) by mouth daily. 11/11/21  Yes Sherren Mocha, MD  lisinopril (ZESTRIL) 10 MG tablet TAKE 1 TABLET BY MOUTH EVERY MORNING AND AT BEDTIME 10/30/21  Yes Sherren Mocha, MD  Alirocumab (PRALUENT) 75 MG/ML SOAJ Inject 1 mL into the skin every 14 (fourteen) days. 02/24/21   Sherren Mocha, MD  Ascorbic Acid (VITAMIN C PO) Take 1 tablet by mouth as needed. Patient not taking: Reported on 02/04/2022    [provider]  aspirin EC 81 MG EC tablet Take 1 tablet (81 mg total) by mouth daily. Patient not taking: Reported on 02/04/2022 06/04/15   Almyra Deforest, PA  b complex vitamins tablet Take 1 tablet by mouth every other day.    [provider]  fluticasone (FLONASE) 50 MCG/ACT nasal spray Place 2 sprays into both nostrils as needed for allergies or rhinitis.    [provider]  hydroxypropyl methylcellulose / hypromellose (ISOPTO TEARS / GONIOVISC) 2.5 % ophthalmic solution Place 1 drop into both eyes as needed for dry eyes.    [provider]  nitroGLYCERIN (NITROSTAT) 0.4 MG SL tablet Place 1 tablet (0.4 mg total) under the tongue every 5 (five) minutes x 3 doses as needed for chest pain. Patient not taking: Reported on 02/04/2022 06/04/15   Almyra Deforest, PA  VITAMIN D PO Take 1 tablet by mouth as needed.    [provider]    Current Outpatient Medications  Medication Sig Dispense Refill  carvedilol (COREG) 6.25 MG tablet Take 1 tablet (6.25 mg total) by mouth 2 (two) times daily with a meal. 180 tablet 3   hydrochlorothiazide (HYDRODIURIL) 25 MG tablet Take 0.5 tablets (12.5 mg total) by mouth daily. 45 tablet 3   lisinopril (ZESTRIL) 10 MG tablet TAKE 1 TABLET BY MOUTH EVERY MORNING AND AT BEDTIME 180 tablet 2   Alirocumab (PRALUENT) 75 MG/ML SOAJ Inject 1 mL into the skin every 14 (fourteen) days. 2 mL 11   Ascorbic Acid (VITAMIN C PO) Take 1 tablet by mouth as needed. (Patient not taking: Reported on 02/04/2022)     aspirin EC  81 MG EC tablet Take 1 tablet (81 mg total) by mouth daily. (Patient not taking: Reported on 02/04/2022)     b complex vitamins tablet Take 1 tablet by mouth every other day.     fluticasone (FLONASE) 50 MCG/ACT nasal spray Place 2 sprays into both nostrils as needed for allergies or rhinitis.     hydroxypropyl methylcellulose / hypromellose (ISOPTO TEARS / GONIOVISC) 2.5 % ophthalmic solution Place 1 drop into both eyes as needed for dry eyes.     nitroGLYCERIN (NITROSTAT) 0.4 MG SL tablet Place 1 tablet (0.4 mg total) under the tongue every 5 (five) minutes x 3 doses as needed for chest pain. (Patient not taking: Reported on 02/04/2022) 25 tablet 3   VITAMIN D PO Take 1 tablet by mouth as needed.     Current Facility-Administered Medications  Medication Dose Route Frequency Provider Last Rate Last Admin   0.9 %  sodium chloride infusion  500 mL Intravenous Once Sharyn Creamer, MD        Allergies as of 02/04/2022 - Review Complete 02/04/2022  Allergen Reaction Noted   Atorvastatin Other (See Comments) 09/05/2020    Family History  Problem Relation Age of Onset   Valvular heart disease Mother    Breast cancer Mother        age 3   Colon polyps Mother    Irritable bowel syndrome Mother    GER disease Mother    Ulcers Mother    Heart attack Father    Colon polyps Father    Throat cancer Maternal Uncle    Colon cancer Neg Hx     Social History   Socioeconomic History   Marital status: Widowed    Spouse name: Not on file   Number of children: Not on file   Years of education: Not on file   Highest education level: Not on file  Occupational History   Not on file  Tobacco Use   Smoking status: Former   Smokeless tobacco: Never   Tobacco comments:    quit when she was 40s  Vaping Use   Vaping Use: Never used  Substance and Sexual Activity   Alcohol use: No    Comment: ocassionally   Drug use: No   Sexual activity: Yes    Birth control/protection: None  Other Topics  Concern   Not on file  Social History Narrative   Not on file   Social Determinants of Health   Financial Resource Strain: Low Risk    Difficulty of Paying Living Expenses: Not hard at all  Food Insecurity: No Food Insecurity   Worried About Charity fundraiser in the Last Year: Never true   [place] in the Last Year: Never true  Transportation Needs: No Transportation Needs   Lack of Transportation (Medical): No   Lack of Transportation (  Non-Medical): No  Physical Activity: Sufficiently Active   Days of Exercise per Week: 5 days   Minutes of Exercise per Session: 30 min  Stress: No Stress Concern Present   Feeling of Stress : Not at all  Social Connections: Socially Isolated   Frequency of Communication with Friends and Family: More than three times a week   Frequency of Social Gatherings with Friends and Family: More than three times a week   Attends Religious Services: Never   Marine scientist or Organizations: No   Attends Archivist Meetings: Never   Marital Status: Widowed  Human resources officer Violence: Not At Risk   Fear of Current or Ex-Partner: No   Emotionally Abused: No   Physically Abused: No   Sexually Abused: No    Physical Exam: Vital signs in last 24 hours: BP (!) 157/77   Pulse 81   Temp 98 F (36.7 C) (Skin)   Ht '5\' 3"'$  (1.6 m)   Wt 219 lb (99.3 kg)   SpO2 94%   BMI 38.79 kg/m  GEN: NAD EYE: Sclerae anicteric ENT: MMM CV: Non-tachycardic Pulm: No increased WOB GI: Soft NEURO:  Alert & Oriented   Christia Reading, MD [facility]   02/04/2022 9:05 AM

## 2022-02-04 NOTE — Patient Instructions (Signed)
Read all of the handouts given to you by your recovery room nurse.  YOU HAD AN ENDOSCOPIC PROCEDURE TODAY AT [facility]:   Refer to the procedure report that was given to you for any specific questions about what was found during the examination.  If the procedure report does not answer your questions, please call your gastroenterologist to clarify.  If you requested that your care partner not be given the details of your procedure findings, then the procedure report has been included in a sealed envelope for you to review at your convenience later.  YOU SHOULD EXPECT: Some feelings of bloating in the abdomen. Passage of more gas than usual.  Walking can help get rid of the air that was put into your GI tract during the procedure and reduce the bloating. If you had a lower endoscopy (such as a colonoscopy or flexible sigmoidoscopy) you may notice spotting of blood in your stool or on the toilet paper. If you underwent a bowel prep for your procedure, you may not have a normal bowel movement for a few days.  Please Note:  You might notice some irritation and congestion in your nose or some drainage.  This is from the oxygen used during your procedure.  There is no need for concern and it should clear up in a day or so.  SYMPTOMS TO REPORT IMMEDIATELY:  Following lower endoscopy (colonoscopy or flexible sigmoidoscopy):  Excessive amounts of blood in the stool  Significant tenderness or worsening of abdominal pains  Swelling of the abdomen that is new, acute  Fever of 100F or higher   For urgent or emergent issues, a gastroenterologist can be reached at any hour by calling [phone]. Do not use MyChart messaging for urgent concerns.    DIET:  We do recommend a small meal at first, but then you may proceed to your regular diet.  Drink plenty of fluids but you should avoid alcoholic beverages for 24 hours. Try to increase the fiber in your diet, and drink plenty of  water.  ACTIVITY:  You should plan to take it easy for the rest of today and you should NOT DRIVE or use heavy machinery until tomorrow (because of the sedation medicines used during the test).    FOLLOW UP: Our staff will call the number listed on your records 24-72 hours following your procedure to check on you and address any questions or concerns that you may have regarding the information given to you following your procedure. If we do not reach you, we will leave a message.  We will attempt to reach you two times.  During this call, we will ask if you have developed any symptoms of COVID 19. If you develop any symptoms (ie: fever, flu-like symptoms, shortness of breath, cough etc.) before then, please call [phone].  If you test positive for Covid 19 in the 2 weeks post procedure, please call and report this information to [place].    If any biopsies were taken you will be contacted by phone or by letter within the next 1-3 weeks.  Please call us at [phone] if you have not heard about the biopsies in 3 weeks.    SIGNATURES/CONFIDENTIALITY: You and/or your care partner have signed paperwork which will be entered into your electronic medical record.  These signatures attest to the fact that that the information above on your After Visit Summary has been reviewed and is understood.  Full responsibility of the confidentiality of this  discharge information lies with you and/or your care-partner.  

## 2022-02-04 NOTE — Progress Notes (Signed)
Pt's states no medical or surgical changes since previsit or office visit. 

## 2022-02-04 NOTE — Progress Notes (Signed)
Called to room to assist during endoscopic procedure.  Patient ID and intended procedure confirmed with present staff. Received instructions for my participation in the procedure from the performing physician.  

## 2022-02-04 NOTE — Op Note (Signed)
[facility] Patient Name: Miranda Wong Procedure Date: 02/04/2022 9:06 AM MRN: [id number] Endoscopist: Sonny Masters "Miranda Wong ,  Age: 76 Referring MD:  Date of Birth: 11-Feb-1946 Gender: Female Account #: [id number] Procedure:                Colonoscopy Indications:              Positive Cologuard test Medicines:                Monitored Anesthesia Care Procedure:                Pre-Anesthesia Assessment:                           - Prior to the procedure, a History and Physical                            was performed, and patient medications and                            allergies were reviewed. The patient's tolerance of                            previous anesthesia was also reviewed. The risks                            and benefits of the procedure and the sedation                            options and risks were discussed with the patient.                            All questions were answered, and informed consent                            was obtained. Prior Anticoagulants: The patient has                            taken no previous anticoagulant or antiplatelet                            agents. ASA Grade Assessment: III - A patient with                            severe systemic disease. After reviewing the risks                            and benefits, the patient was deemed in                            satisfactory condition to undergo the procedure.                           After obtaining informed consent, the colonoscope  was passed under direct vision. Throughout the                            procedure, the patient's blood pressure, pulse, and                            oxygen saturations were monitored continuously. The                            Olympus PCF-H190DL (#[id number]) Colonoscope was                            introduced through the anus and advanced to the the                            terminal ileum. The colonoscopy  was performed                            without difficulty. The patient tolerated the                            procedure well. The quality of the bowel                            preparation was good. The terminal ileum, ileocecal                            valve, appendiceal orifice, and rectum were                            photographed. Scope In: 9:14:32 AM Scope Out: 9:48:05 AM Scope Withdrawal Time: 0 hours 25 minutes 36 seconds  Total Procedure Duration: 0 hours 33 minutes 33 seconds  Findings:                 The terminal ileum appeared normal.                           11 sessile polyps were found in the transverse                            colon and ascending colon. The polyps were 3 to 10                            mm in size.                           Two sessile polyps were found in the descending                            colon. The polyps were 2 to 4 mm in size. These                            polyps were removed with a cold snare. Resection  and retrieval were complete.                           Multiple small-mouthed diverticula were found in                            the sigmoid colon and descending colon.                           Non-bleeding internal hemorrhoids were found during                            retroflexion. Complications:            No immediate complications. Estimated Blood Loss:     Estimated blood loss was minimal. Impression:               - The examined portion of the ileum was normal.                           - 11 3 to 10 mm polyps in the transverse colon and                            in the ascending colon.                           - Two 2 to 4 mm polyps in the descending colon,                            removed with a cold snare. Resected and retrieved.                           - Diverticulosis in the sigmoid colon and in the                            descending colon.                           -  Non-bleeding internal hemorrhoids. Recommendation:           - Discharge patient to home (with escort).                           - Await pathology results.                           - The findings and recommendations were discussed                            with the patient. Sonny Masters "Miranda Wong,  02/04/2022 9:57:54 AM

## 2022-02-05 ENCOUNTER — Telehealth: Payer: Self-pay

## 2022-02-05 ENCOUNTER — Telehealth: Payer: Self-pay | Admitting: *Deleted

## 2022-02-05 NOTE — Telephone Encounter (Signed)
Attempted f/u phone call. No answer. Left message. °

## 2022-02-05 NOTE — Telephone Encounter (Signed)
  Follow up Call-     02/04/2022    8:33 AM  Call back number  Post procedure Call Back phone  # [phone]  Permission to leave phone message Yes     Patient questions:  Do you have a fever, pain , or abdominal swelling? No. Pain Score  0 *  Have you tolerated food without any problems? Yes.    Have you been able to return to your normal activities? Yes.    Do you have any questions about your discharge instructions: Diet   No. Medications  No. Follow up visit  No.  Do you have questions or concerns about your Care? No.  Actions: * If pain score is 4 or above: No action needed, pain <4.

## 2022-02-06 ENCOUNTER — Ambulatory Visit
Admission: RE | Admit: 2022-02-06 | Discharge: 2022-02-06 | Disposition: A | Discharge status: Home / Self Care | Payer: Medicare Other | Source: Ambulatory Visit | Attending: Family Medicine | Admitting: Family Medicine

## 2022-02-06 ENCOUNTER — Encounter: Payer: Self-pay | Admitting: Internal Medicine

## 2022-02-06 DIAGNOSIS — Z1231 Encounter for screening mammogram for malignant neoplasm of breast: Secondary | ICD-10-CM | POA: Diagnosis not present

## 2022-02-10 ENCOUNTER — Other Ambulatory Visit: Payer: Self-pay | Admitting: Family Medicine

## 2022-02-10 DIAGNOSIS — R928 Other abnormal and inconclusive findings on diagnostic imaging of breast: Secondary | ICD-10-CM

## 2022-02-13 ENCOUNTER — Ambulatory Visit
Admission: RE | Admit: 2022-02-13 | Discharge: 2022-02-13 | Disposition: A | Discharge status: Home / Self Care | Payer: Medicare Other | Source: Ambulatory Visit | Attending: Family Medicine | Admitting: Family Medicine

## 2022-02-13 ENCOUNTER — Ambulatory Visit: Payer: Medicare Other

## 2022-02-13 DIAGNOSIS — R928 Other abnormal and inconclusive findings on diagnostic imaging of breast: Secondary | ICD-10-CM | POA: Diagnosis not present

## 2022-02-22 ENCOUNTER — Other Ambulatory Visit: Payer: Self-pay | Admitting: Cardiovascular Disease

## 2022-02-22 DIAGNOSIS — I2510 Atherosclerotic heart disease of native coronary artery without angina pectoris: Secondary | ICD-10-CM

## 2022-02-22 DIAGNOSIS — E782 Mixed hyperlipidemia: Secondary | ICD-10-CM

## 2022-06-02 ENCOUNTER — Other Ambulatory Visit: Payer: Self-pay | Admitting: Physician Assistant

## 2022-06-02 ENCOUNTER — Other Ambulatory Visit: Payer: Self-pay | Admitting: Cardiovascular Disease

## 2022-06-02 DIAGNOSIS — I2510 Atherosclerotic heart disease of native coronary artery without angina pectoris: Secondary | ICD-10-CM

## 2022-06-02 NOTE — Telephone Encounter (Signed)
Rx refill sent to pharmacy. 

## 2022-07-20 NOTE — Progress Notes (Signed)
HPI: [name] is a 76 y.o. female, who is here today for chronic disease management.  Last seen on 01/21/22.  Hypertension: She is currently taking lisinopril 10 mg daily, hydrochlorothiazide 25 mg one half daily, and carvedilol 6.25 mg twice daily. She has not been monitoring her blood pressure at home. Negative for unusual or severe headache, visual changes, exertional chest pain, dyspnea,  focal weakness, or edema.  CKD 3 and vitamin D deficiency: Negative for decreased urine output, gross hematuria, or foamy urine. Cr 0.9-1.03 and e GFR 54-58.  She has been taking vitamin D supplementation at a dose of 1,000 units per day.   Thrombocytopenia: Her last platelet count was 112,000, down from 134,000. She has not experienced any bleeding.  She underwent a colonoscopy after a positive Cologuard result, during which 13 polyps were found and two were removed. They are scheduled for a follow-up colonoscopy in one year.  Diabetes Mellitus II: Diagnosed in 07/2018. She reports not checking her blood sugar at home. Her last A1c was 7.8 in 12/2021. She is on nonpharmacologic treatment. Has not made any dietary or physical activity changes since her last visit.  - Negative for symptoms of hypoglycemia, polyuria, polydipsia, numbness extremities, foot ulcers/trauma  Lab Results  Component Value Date   MICROALBUR 0.8 01/21/2022   She reports occasional right earache for the past couple of days, but denies any fever or other symptoms. She has a history of allergies and experiences congestion throughout the year. She occasionally uses Q-tips for ear cleaning but is aware not to push them in too far. Negative for fever, chills, or sick contact.  Review of Systems  Constitutional:  Negative for activity change, appetite change and fever.  HENT:  Positive for congestion, postnasal drip and rhinorrhea. Negative for mouth sores, nosebleeds and trouble swallowing.   Respiratory:  Negative  for cough and wheezing.   Gastrointestinal:  Negative for abdominal pain, nausea and vomiting.       Negative for changes in bowel habits.  Allergic/Immunologic: Positive for environmental allergies.  Neurological:  Negative for syncope and facial asymmetry.  Hematological:  Negative for adenopathy. Does not bruise/bleed easily.  See other pertinent positives and negatives in HPI.  Current Outpatient Medications on File Prior to Visit  Medication Sig Dispense Refill   Alirocumab (PRALUENT) 75 MG/ML SOAJ INJECT 1 ML INTO THE SKIN EVERY 14 (FOURTEEN) DAYS. 6 mL 3   b complex vitamins tablet Take 1 tablet by mouth every other day.     carvedilol (COREG) 6.25 MG tablet Take 1 tablet (6.25 mg total) by mouth 2 (two) times daily with a meal. 180 tablet 3   fluticasone (FLONASE) 50 MCG/ACT nasal spray Place 2 sprays into both nostrils as needed for allergies or rhinitis.     hydrochlorothiazide (HYDRODIURIL) 25 MG tablet Take 0.5 tablets (12.5 mg total) by mouth daily. 45 tablet 3   hydroxypropyl methylcellulose / hypromellose (ISOPTO TEARS / GONIOVISC) 2.5 % ophthalmic solution Place 1 drop into both eyes as needed for dry eyes.     lisinopril (ZESTRIL) 10 MG tablet TAKE 1 TABLET BY MOUTH EVERY MORNING AND AT BEDTIME 180 tablet 0   VITAMIN D PO Take 1 tablet by mouth as needed.     Ascorbic Acid (VITAMIN C PO) Take 1 tablet by mouth as needed. (Patient not taking: Reported on 02/04/2022)     aspirin EC 81 MG EC tablet Take 1 tablet (81 mg total) by mouth daily. (Patient not taking:  Reported on 02/04/2022)     nitroGLYCERIN (NITROSTAT) 0.4 MG SL tablet Place 1 tablet (0.4 mg total) under the tongue every 5 (five) minutes x 3 doses as needed for chest pain. (Patient not taking: Reported on 02/04/2022) 25 tablet 3   No current facility-administered medications on file prior to visit.   Past Medical History:  Diagnosis Date   Allergy    CAD (coronary artery disease)    cath 06/02/2015 25% mid RCA, 25%  prox LCx, 100% prox LAD treated with DES (3.0 x 15 mm Xience), EF 40%   Chicken pox    Former smoker    Heart disease    Hyperlipidemia    Hypertension    Mild ichemic cardiomyopathy, EF 45-50% post cath 06/2015 06/04/2015   NSTEMI (non-ST elevated myocardial infarction) (Miranda Wong) 06/02/2015   Type 2 diabetes mellitus without complication (Miranda Wong) 62/83/6629   Allergies  Allergen Reactions   Atorvastatin Other (See Comments)    myalgia   Social History   Socioeconomic History   Marital status: Widowed    Spouse name: Not on file   Number of children: Not on file   Years of education: Not on file   Highest education level: Not on file  Occupational History   Not on file  Tobacco Use   Smoking status: Former   Smokeless tobacco: Never   Tobacco comments:    quit when she was 39s  Vaping Use   Vaping Use: Never used  Substance and Sexual Activity   Alcohol use: No    Comment: ocassionally   Drug use: No   Sexual activity: Yes    Birth control/protection: None  Other Topics Concern   Not on file  Social History Narrative   Not on file   Social Determinants of Health   Financial Resource Strain: Low Risk  (12/01/2021)   Overall Financial Resource Strain (CARDIA)    Difficulty of Paying Living Expenses: Not hard at all  Food Insecurity: No Food Insecurity (12/01/2021)   Hunger Vital Sign    Worried About Running Out of Food in the Last Year: Never true    [place] in the Last Year: Never true  Transportation Needs: No Transportation Needs (12/01/2021)   PRAPARE - Hydrologist (Medical): No    Lack of Transportation (Non-Medical): No  Physical Activity: Sufficiently Active (12/01/2021)   Exercise Vital Sign    Days of Exercise per Week: 5 days    Minutes of Exercise per Session: 30 min  Stress: No Stress Concern Present (12/01/2021)   [place]    Feeling of Stress : Not at all   Social Connections: Socially Isolated (12/01/2021)   Social Connection and Isolation Panel [NHANES]    Frequency of Communication with Friends and Family: More than three times a week    Frequency of Social Gatherings with Friends and Family: More than three times a week    Attends Religious Services: Never    Marine scientist or Organizations: No    Attends Archivist Meetings: Never    Marital Status: Widowed   Vitals:   07/21/22 0923  BP: 118/70  Pulse: 64  Resp: 16  Temp: 97.9 F (36.6 C)  SpO2: 97%  Body mass index is 39.04 kg/m.  Physical Exam Vitals and nursing note reviewed.  Constitutional:      General: She is not in acute distress.  Appearance: She is well-developed.  HENT:     Head: Normocephalic and atraumatic.     Right Ear: External ear normal. No tenderness.     Left Ear: External ear normal. No tenderness.     Ears:     Comments: Cerumen excess in ear canals, could not see TMs bilaterally.  No erythema or edema noted.    Nose: Rhinorrhea present.     Mouth/Throat:     Mouth: Mucous membranes are moist.     Dentition: Has dentures.     Pharynx: Oropharynx is clear.  Eyes:     Conjunctiva/sclera: Conjunctivae normal.  Cardiovascular:     Rate and Rhythm: Normal rate and regular rhythm.     Pulses:          Dorsalis pedis pulses are 2+ on the right side and 2+ on the left side.     Heart sounds: No murmur heard. Pulmonary:     Effort: Pulmonary effort is normal. No respiratory distress.     Breath sounds: Normal breath sounds.  Abdominal:     Palpations: Abdomen is soft. There is no hepatomegaly or mass.     Tenderness: There is no abdominal tenderness.  Lymphadenopathy:     Cervical: No cervical adenopathy.  Skin:    General: Skin is warm.     Findings: No erythema or rash.  Neurological:     General: No focal deficit present.     Mental Status: She is alert and oriented to person, place, and time.     Cranial Nerves: No  cranial nerve deficit.     Gait: Gait normal.  Psychiatric:        Mood and Affect: Mood and affect normal.   ASSESSMENT AND PLAN:  [name] was seen today for follow-up.  Diagnoses and all orders for this visit: Lab Results  Component Value Date   CREATININE 0.93 07/21/2022   BUN 9 07/21/2022   NA 138 07/21/2022   K 3.9 07/21/2022   CL 104 07/21/2022   CO2 26 07/21/2022   Lab Results  Component Value Date   HGBA1C 7.7 (H) 07/21/2022   Lab Results  Component Value Date   WBC 3.7 (L) 07/21/2022   HGB 15.1 (H) 07/21/2022   HCT 43.5 07/21/2022   MCV 94.6 07/21/2022   PLT 111.0 (L) 07/21/2022   Earache on right Ear examination today negative except for exercise on mental. Continue monitoring for new symptoms. Avoid Q-tip use.  Need for influenza vaccination -     Flu Vaccine QUAD High Dose(Fluad)  Essential hypertension BP adequately controlled. Continue lisinopril 10 mg daily, carvedilol 6.25 mg twice daily, and HCTZ 25 mg 1/2 tablet daily. Low-salt/DASH diet recommended. Eye exam is current.  Type 2 diabetes mellitus with other specified complication (Miranda Wong) [id number] is not at goal. She has not bee interested in pharmacologic treatment, concerned about possible side effects.  Continue nonpharmacologic treatment, further recommendation will be given according to hemoglobin A1c result. Annual eye exam, periodic dental and foot care to continue. F/U in 5-6 months  Stage 3a chronic kidney disease (Miranda Wong) We discussed diagnostic criteria for CKD and stages. Problem has been stable, e GFR 54-58. Stressed the importance of adequate glucose and BP controlled. Continue low-salt diet and avoidance of NSAIDs. We will continue monitoring regularly.  Thrombocytopenia (HCC) Last platelet numbers went from 134 to 112. Further recommendation will be given according to CBC result, if getting worse hematology referral will be placed.  Return in about 6  months (around 01/19/2023)  for chronic problems.  Denesha Brouse G. [place], MD  [facility]. [place] office.

## 2022-07-21 ENCOUNTER — Ambulatory Visit (INDEPENDENT_AMBULATORY_CARE_PROVIDER_SITE_OTHER): Payer: Medicare Other | Admitting: Family Medicine

## 2022-07-21 ENCOUNTER — Encounter: Payer: Self-pay | Admitting: Family Medicine

## 2022-07-21 VITALS — BP 118/70 | HR 64 | Temp 97.9°F | Resp 16 | Ht 63.0 in | Wt 220.4 lb

## 2022-07-21 DIAGNOSIS — Z23 Encounter for immunization: Secondary | ICD-10-CM | POA: Diagnosis not present

## 2022-07-21 DIAGNOSIS — E782 Mixed hyperlipidemia: Secondary | ICD-10-CM

## 2022-07-21 DIAGNOSIS — E1169 Type 2 diabetes mellitus with other specified complication: Secondary | ICD-10-CM | POA: Diagnosis not present

## 2022-07-21 DIAGNOSIS — I10 Essential (primary) hypertension: Secondary | ICD-10-CM | POA: Diagnosis not present

## 2022-07-21 DIAGNOSIS — H9201 Otalgia, right ear: Secondary | ICD-10-CM

## 2022-07-21 DIAGNOSIS — I255 Ischemic cardiomyopathy: Secondary | ICD-10-CM

## 2022-07-21 DIAGNOSIS — N1831 Chronic kidney disease, stage 3a: Secondary | ICD-10-CM

## 2022-07-21 DIAGNOSIS — D696 Thrombocytopenia, unspecified: Secondary | ICD-10-CM | POA: Diagnosis not present

## 2022-07-21 DIAGNOSIS — E559 Vitamin D deficiency, unspecified: Secondary | ICD-10-CM | POA: Diagnosis not present

## 2022-07-21 LAB — BASIC METABOLIC PANEL
BUN: 9 mg/dL (ref 6–23)
CO2: 26 mEq/L (ref 19–32)
Calcium: 10.4 mg/dL (ref 8.4–10.5)
Chloride: 104 mEq/L (ref 96–112)
Creatinine, Ser: 0.93 mg/dL (ref 0.40–1.20)
GFR: 60.55 mL/min (ref >60.00)
Glucose, Bld: 158 mg/dL — ABNORMAL HIGH (ref 70–99)
Potassium: 3.9 mEq/L (ref 3.5–5.1)
Sodium: 138 mEq/L (ref 135–145)

## 2022-07-21 LAB — CBC WITH DIFFERENTIAL/PLATELET
Basophils Absolute: 0.1 10*3/uL (ref 0.0–0.1)
Basophils Relative: 1.4 % (ref 0.0–3.0)
Eosinophils Absolute: 0.2 10*3/uL (ref 0.0–0.7)
Eosinophils Relative: 6.1 % — ABNORMAL HIGH (ref 0.0–5.0)
HCT: 43.5 % (ref 36.0–46.0)
Hemoglobin: 15.1 g/dL — ABNORMAL HIGH (ref 12.0–15.0)
Lymphocytes Relative: 29.4 % (ref 12.0–46.0)
Lymphs Abs: 1.1 10*3/uL (ref 0.7–4.0)
MCHC: 34.8 g/dL (ref 30.0–36.0)
MCV: 94.6 fl (ref 78.0–100.0)
Monocytes Absolute: 0.3 10*3/uL (ref 0.1–1.0)
Monocytes Relative: 9.4 % (ref 3.0–12.0)
Neutro Abs: 2 10*3/uL (ref 1.4–7.7)
Neutrophils Relative %: 53.7 % (ref 43.0–77.0)
Platelets: 111 10*3/uL — ABNORMAL LOW (ref 150.0–400.0)
RBC: 4.6 Mil/uL (ref 3.87–5.11)
RDW: 13.2 % (ref 11.5–15.5)
WBC: 3.7 10*3/uL — ABNORMAL LOW (ref 4.0–10.5)

## 2022-07-21 LAB — VITAMIN D 25 HYDROXY (VIT D DEFICIENCY, FRACTURES): VITD: 24.49 ng/mL — ABNORMAL LOW (ref 30.00–100.00)

## 2022-07-21 LAB — HEMOGLOBIN A1C: Hgb A1c MFr Bld: 7.7 % — ABNORMAL HIGH (ref 4.6–6.5)

## 2022-07-21 NOTE — Assessment & Plan Note (Signed)
HgA1C is not at goal. She has not bee interested in pharmacologic treatment, concerned about possible side effects.  Continue nonpharmacologic treatment, further recommendation will be given according to hemoglobin A1c result. Annual eye exam, periodic dental and foot care to continue. F/U in 5-6 months

## 2022-07-21 NOTE — Patient Instructions (Signed)
A few things to remember from today's visit:  Type 2 diabetes mellitus with other specified complication, without long-term current use of insulin ([place]) - Plan: Hemoglobin A1c, Fructosamine  Essential hypertension - Plan: Basic metabolic panel  Stage 3a chronic kidney disease (HCC)  Vitamin D deficiency, unspecified - Plan: VITAMIN D 25 Hydroxy (Vit-D Deficiency, Fractures)  Thrombocytopenia (HCC) - Plan: CBC with Differential/Platelet  No changes today. Will recommend Farxiga or Jardiance for diabetes depending of numbers.  If you need refills for medications you take chronically, please call your pharmacy. Do not use My Chart to request refills or for acute issues that need immediate attention. If you send a my chart message, it may take a few days to be addressed, specially if I am not in the office.  Please be sure medication list is accurate. If a new problem present, please set up appointment sooner than planned today.

## 2022-07-21 NOTE — Assessment & Plan Note (Signed)
We discussed diagnostic criteria for CKD and stages. Problem has been stable, e GFR 54-58. Stressed the importance of adequate glucose and BP controlled. Continue low-salt diet and avoidance of NSAIDs. We will continue monitoring regularly.

## 2022-07-21 NOTE — Assessment & Plan Note (Signed)
Last platelet numbers went from 134 to 112. Further recommendation will be given according to CBC result, if getting worse hematology referral will be placed.

## 2022-07-21 NOTE — Assessment & Plan Note (Signed)
BP adequately controlled. Continue lisinopril 10 mg daily, carvedilol 6.25 mg twice daily, and HCTZ 25 mg 1/2 tablet daily. Low-salt/DASH diet recommended. Eye exam is current.

## 2022-07-27 LAB — FRUCTOSAMINE: Fructosamine: 324 umol/L — ABNORMAL HIGH (ref 205–285)

## 2022-08-06 ENCOUNTER — Telehealth: Payer: Self-pay | Admitting: Hematology and Oncology

## 2022-08-06 NOTE — Telephone Encounter (Signed)
Scheduled appointment per referral. Patient is aware of appointment date and time. Patient is aware to arrive 15 mins prior to appointment time and to bring updated insurance cards. Patient is aware of location.   

## 2022-08-27 ENCOUNTER — Other Ambulatory Visit: Payer: Self-pay | Admitting: Cardiovascular Disease

## 2022-08-27 DIAGNOSIS — I2510 Atherosclerotic heart disease of native coronary artery without angina pectoris: Secondary | ICD-10-CM

## 2022-09-07 ENCOUNTER — Encounter: Payer: Self-pay | Admitting: Physician Assistant

## 2022-09-07 ENCOUNTER — Ambulatory Visit: Payer: Medicare Other | Attending: Physician Assistant | Admitting: Cardiology

## 2022-09-07 VITALS — BP 136/79 | HR 61 | Ht 63.0 in | Wt 217.0 lb

## 2022-09-07 DIAGNOSIS — I255 Ischemic cardiomyopathy: Secondary | ICD-10-CM

## 2022-09-07 DIAGNOSIS — I2510 Atherosclerotic heart disease of native coronary artery without angina pectoris: Secondary | ICD-10-CM

## 2022-09-07 DIAGNOSIS — I10 Essential (primary) hypertension: Secondary | ICD-10-CM | POA: Diagnosis not present

## 2022-09-07 DIAGNOSIS — E782 Mixed hyperlipidemia: Secondary | ICD-10-CM | POA: Diagnosis not present

## 2022-09-07 NOTE — Progress Notes (Signed)
Cardiology Office Note:    Date:  09/07/2022   ID:  Miranda Wong, DOB May 24, 1946, MRN [id number]  PCP:  Miranda Wong, [facility] Cardiologist:  Sherren Mocha, MD     Referring MD: [place], Betty G, MD   No chief complaint on file.   History of Present Illness:    Miranda Wong is a 77 y.o. female with a hx of CAD (cath 06/02/2015 25% mid RCA, 25% prox LCx, 100% prox LAD treated with DES -- 3.0 x 15 mm Xience), HTN, HLD, former smoker.   Last seen in the office on 08/29/21 by Dr. Burt Knack, was doing well at that time from a cardiac perspective. Some financial concerns with affording Praluent.   She presents today for follow up of her CAD and HTN. She denies chest pain, palpitations, dyspnea, pnd, orthopnea, n, v, dizziness, syncope, edema, weight gain, or early satiety. She was having financial concerns with Praluent but advised that now she is able to afford it with assistance from PharmD. Her PCP has told her she has diabetes, but she is reluctant to believe this, states her father had it and she is aware of what to look for. She prefers to have a simple medication regimen and she does not want to take anything that is not absolutely necessary. We discussed all of her cardiac related medications and their continued indications in light of her CAD. She denies chest pain, palpitations, dyspnea, pnd, orthopnea, n, v, dizziness, syncope, edema, weight gain, or early satiety.   Past Medical History:  Diagnosis Date   Allergy    CAD (coronary artery disease)    cath 06/02/2015 25% mid RCA, 25% prox LCx, 100% prox LAD treated with DES (3.0 x 15 mm Xience), EF 40%   Chicken pox    Former smoker    Heart disease    Hyperlipidemia    Hypertension    Mild ichemic cardiomyopathy, EF 45-50% post cath 06/2015 06/04/2015   NSTEMI (non-ST elevated myocardial infarction) ([place]) 06/02/2015   Type 2 diabetes mellitus without complication ([place]) 87/56/4332    Past Surgical  History:  Procedure Laterality Date   CARDIAC CATHETERIZATION N/A 06/02/2015   Procedure: Left Heart Cath and Coronary Angiography;  Surgeon: Sherren Mocha, MD;  Location: [facility];  Service: Cardiovascular;  Laterality: N/A;   CHOLECYSTECTOMY     GALLBLADDER SURGERY     TUBAL LIGATION      Current Medications: Current Meds  Medication Sig   Alirocumab (PRALUENT) 75 MG/ML SOAJ INJECT 1 ML INTO THE SKIN EVERY 14 (FOURTEEN) DAYS.   aspirin EC 81 MG EC tablet Take 1 tablet (81 mg total) by mouth daily.   b complex vitamins tablet Take 1 tablet by mouth every other day.   carvedilol (COREG) 6.25 MG tablet Take 1 tablet (6.25 mg total) by mouth 2 (two) times daily with a meal.   fluticasone (FLONASE) 50 MCG/ACT nasal spray Place 2 sprays into both nostrils as needed for allergies or rhinitis.   hydrochlorothiazide (HYDRODIURIL) 25 MG tablet Take 0.5 tablets (12.5 mg total) by mouth daily.   hydroxypropyl methylcellulose / hypromellose (ISOPTO TEARS / GONIOVISC) 2.5 % ophthalmic solution Place 1 drop into both eyes as needed for dry eyes.   lisinopril (ZESTRIL) 10 MG tablet TAKE 1 TABLET BY MOUTH EVERY MORNING AND AT BEDTIME   nitroGLYCERIN (NITROSTAT) 0.4 MG SL tablet Place 1 tablet (0.4 mg total) under the tongue every 5 (five) minutes  x 3 doses as needed for chest pain.   VITAMIN D PO Take 2 tablets by mouth daily.     Allergies:   Atorvastatin   Social History   Socioeconomic History   Marital status: Widowed    Spouse name: Not on file   Number of children: Not on file   Years of education: Not on file   Highest education level: Not on file  Occupational History   Not on file  Tobacco Use   Smoking status: Former   Smokeless tobacco: Never   Tobacco comments:    quit when she was 41s  Vaping Use   Vaping Use: Never used  Substance and Sexual Activity   Alcohol use: No    Comment: ocassionally   Drug use: No   Sexual activity: Yes    Birth control/protection:  None  Other Topics Concern   Not on file  Social History Narrative   Not on file   Social Determinants of Health   Financial Resource Strain: Low Risk  (12/01/2021)   Overall Financial Resource Strain (CARDIA)    Difficulty of Paying Living Expenses: Not hard at all  Food Insecurity: No Food Insecurity (12/01/2021)   Hunger Vital Sign    Worried About Running Out of Food in the Last Year: Never true    [place] in the Last Year: Never true  Transportation Needs: No Transportation Needs (12/01/2021)   PRAPARE - Hydrologist (Medical): No    Lack of Transportation (Non-Medical): No  Physical Activity: Sufficiently Active (12/01/2021)   Exercise Vital Sign    Days of Exercise per Week: 5 days    Minutes of Exercise per Session: 30 min  Stress: No Stress Concern Present (12/01/2021)   [place]    Feeling of Stress : Not at all  Social Connections: Socially Isolated (12/01/2021)   Social Connection and Isolation Panel [NHANES]    Frequency of Communication with Friends and Family: More than three times a week    Frequency of Social Gatherings with Friends and Family: More than three times a week    Attends Religious Services: Never    Marine scientist or Organizations: No    Attends Archivist Meetings: Never    Marital Status: Widowed     Family History: The patient's family history includes Breast cancer in her mother; Colon polyps in her father and mother; GER disease in her mother; Heart attack in her father; Irritable bowel syndrome in her mother; Throat cancer in her maternal uncle; Ulcers in her mother; Valvular heart disease in her mother. There is no history of Colon cancer.  ROS:   Please see the history of present illness.     All other systems reviewed and are negative.  EKGs/Labs/Other Studies Reviewed:    The following studies were reviewed today:  10/10/15  echo complete - EF 65-75%, no RWMA, abnormal LV relaxation (grade I DD), TV mild regurgitation, PA mildly elevated (38)  06/03/15 echo complete - EF 45-50%, mild LVH, severe hypokinesis.   06/02/15 left heart cath -  1. Total occlusion of the mid-LAD at the first diagonal origin, treated successfully with PCI (DES platform) 2. Mild nonobstructive LCx and RCA stenosis 3. Moderate segmental LV systolic dysfunction with LVEF estimated at 40%  EKG:  EKG is ordered today.  The ekg ordered today demonstrates NSR with left axis deviation, HR 61 bpm.  Recent Labs: 01/21/2022: ALT 41 07/21/2022: BUN 9; Creatinine, Ser 0.93; Hemoglobin 15.1; Platelets 111.0; Potassium 3.9; Sodium 138  Recent Lipid Panel    Component Value Date/Time   CHOL 138 01/21/2022 0842   CHOL 121 11/13/2020 1027   TRIG 134.0 01/21/2022 0842   HDL 55.40 01/21/2022 0842   HDL 60 11/13/2020 1027   CHOLHDL 2 01/21/2022 0842   VLDL 26.8 01/21/2022 0842   LDLCALC 56 01/21/2022 0842   LDLCALC 43 11/13/2020 1027     Risk Assessment/Calculations:                Physical Exam:    VS:  BP 136/79   Pulse 61   Ht '5\' 3"'$  (1.6 m)   Wt 217 lb (98.4 kg)   SpO2 95%   BMI 38.44 kg/m     Wt Readings from Last 3 Encounters:  09/07/22 217 lb (98.4 kg)  07/21/22 220 lb 6 oz (100 kg)  02/04/22 219 lb (99.3 kg)     GEN:  Well nourished, well developed in no acute distress HEENT: Normal NECK: No JVD; No carotid bruits LYMPHATICS: No lymphadenopathy CARDIAC: RRR, no murmurs, rubs, gallops RESPIRATORY:  Clear to auscultation without rales, wheezing or rhonchi  ABDOMEN: Soft, non-tender, non-distended MUSCULOSKELETAL:  No edema; No deformity  SKIN: Warm and dry NEUROLOGIC:  Alert and oriented x 3 PSYCHIATRIC:  Normal affect   ASSESSMENT:    1. Coronary artery disease involving native coronary artery of native heart without angina pectoris   2. Ischemic cardiomyopathy   3. Essential hypertension   4. Mixed  hyperlipidemia    PLAN:    In order of problems listed above:  CAD - She intermittently takes her ASA secondary to GI intolerances (diarrhea). Discussed importance of continuing to take if she is able to. Discussed Plavix as an alternative, however she does not want to consider a new medication and states that she will continue to take the ASA. Denies chest pain or acute decompensation. Continues to be active per her baseline. Continue carvedilol and ASA. Ischemic cardiomyopathy - Most recent echo 2017 EF 65-75%, no RWMA, abnormal LV relaxation (grade I DD), TV mild regurgitation, PA mildly elevated (38); improved from prior 2016 EF 45-50% mild LVH. Discussed repeat echo as it has been since 2017, she questions the importance and necessity and will think about it. She would like to see what Dr. York Cerise recommendations are. She is currently asymptomatic, denies SOB, or any other HF related symptoms.  HTN - BP today is 136/79 and currently controlled--she does state her BP is always slightly higher at the MD office and typically it runs "120's", continue carvedilol, HCTZ, lisinopril HLD - LDL 56 01/21/22 currently managed, goal of < 55 however she has had issues with tolerance of statins (myalgias and transaminases), continue on Praluent.   Disposition - Follow up in 1 year with Dr. Burt Knack, could consider repeat echo for TVR and elevated PA pressures of previous echo 2017.              Medication Adjustments/Labs and Tests Ordered: Current medicines are reviewed at length with the patient today.  Concerns regarding medicines are outlined above.  Orders Placed This Encounter  Procedures   EKG 12-Lead   No orders of the defined types were placed in this encounter.   Patient Instructions  Medication Instructions:  Your physician recommends that you continue on your current medications as directed. Please refer to the Current Medication list given to you today. *  If you need a refill on your  cardiac medications before your next appointment, please call your pharmacy*   Lab Work: None ordered  If you have labs (blood work) drawn today and your tests are completely normal, you will receive your results only by: [place] (if you have MyChart) OR A paper copy in the mail If you have any lab test that is abnormal or we need to change your treatment, we will call you to review the results.   Testing/Procedures: None ordered   Follow-Up: At [facility] you and your health needs are our priority.  As part of our continuing mission to provide you with exceptional heart care, we have created designated Provider Care Teams.  These Care Teams include your primary Cardiologist (physician) and Advanced Practice Providers (APPs -  Physician Assistants and Nurse Practitioners) who all work together to provide you with the care you need, when you need it.  We recommend signing up for the patient portal called "MyChart".  Sign up information is provided on this After Visit Summary.  MyChart is used to connect with patients for Virtual Visits (Telemedicine).  Patients are able to view lab/test results, encounter notes, upcoming appointments, etc.  Non-urgent messages can be sent to your provider as well.   To learn more about what you can do with MyChart, go to [email or web address].    Your next appointment:   1 year(s)  The format for your next appointment:   In Person  Provider:   Sherren Mocha, MD     Other Instructions   Important Information About Sugar         Signed, Trudi Ida, NP  09/07/2022 4:21 PM    [place]

## 2022-09-07 NOTE — Patient Instructions (Signed)
Medication Instructions:  Your physician recommends that you continue on your current medications as directed. Please refer to the Current Medication list given to you today. *If you need a refill on your cardiac medications before your next appointment, please call your pharmacy*   Lab Work: None ordered  If you have labs (blood work) drawn today and your tests are completely normal, you will receive your results only by: [place] (if you have MyChart) OR A paper copy in the mail If you have any lab test that is abnormal or we need to change your treatment, we will call you to review the results.   Testing/Procedures: None ordered   Follow-Up: At [facility], you and your health needs are our priority.  As part of our continuing mission to provide you with exceptional heart care, we have created designated Provider Care Teams.  These Care Teams include your primary Cardiologist (physician) and Advanced Practice Providers (APPs -  Physician Assistants and Nurse Practitioners) who all work together to provide you with the care you need, when you need it.  We recommend signing up for the patient portal called "MyChart".  Sign up information is provided on this After Visit Summary.  MyChart is used to connect with patients for Virtual Visits (Telemedicine).  Patients are able to view lab/test results, encounter notes, upcoming appointments, etc.  Non-urgent messages can be sent to your provider as well.   To learn more about what you can do with MyChart, go to [email or web address].    Your next appointment:   1 year(s)  The format for your next appointment:   In Person  Provider:   Sherren Mocha, MD     Other Instructions   Important Information About Sugar

## 2022-09-10 ENCOUNTER — Inpatient Hospital Stay: Payer: Medicare Other | Attending: Hematology and Oncology | Admitting: Hematology and Oncology

## 2022-09-10 ENCOUNTER — Other Ambulatory Visit: Payer: Self-pay

## 2022-09-10 ENCOUNTER — Inpatient Hospital Stay: Payer: Medicare Other

## 2022-09-10 VITALS — BP 147/70 | HR 62 | Temp 98.6°F | Wt 215.2 lb

## 2022-09-10 DIAGNOSIS — Z87891 Personal history of nicotine dependence: Secondary | ICD-10-CM | POA: Diagnosis not present

## 2022-09-10 DIAGNOSIS — E119 Type 2 diabetes mellitus without complications: Secondary | ICD-10-CM | POA: Diagnosis not present

## 2022-09-10 DIAGNOSIS — Z79899 Other long term (current) drug therapy: Secondary | ICD-10-CM | POA: Diagnosis not present

## 2022-09-10 DIAGNOSIS — D696 Thrombocytopenia, unspecified: Secondary | ICD-10-CM

## 2022-09-10 DIAGNOSIS — I10 Essential (primary) hypertension: Secondary | ICD-10-CM | POA: Insufficient documentation

## 2022-09-10 DIAGNOSIS — E785 Hyperlipidemia, unspecified: Secondary | ICD-10-CM | POA: Diagnosis not present

## 2022-09-10 DIAGNOSIS — I2510 Atherosclerotic heart disease of native coronary artery without angina pectoris: Secondary | ICD-10-CM | POA: Insufficient documentation

## 2022-09-10 DIAGNOSIS — I252 Old myocardial infarction: Secondary | ICD-10-CM | POA: Diagnosis not present

## 2022-09-10 DIAGNOSIS — Z7982 Long term (current) use of aspirin: Secondary | ICD-10-CM | POA: Insufficient documentation

## 2022-09-10 LAB — CBC WITH DIFFERENTIAL (CANCER CENTER ONLY)
Abs Immature Granulocytes: 0.03 10*3/uL (ref 0.00–0.07)
Basophils Absolute: 0.1 10*3/uL (ref 0.0–0.1)
Basophils Relative: 1 %
Eosinophils Absolute: 0.3 10*3/uL (ref 0.0–0.5)
Eosinophils Relative: 6 %
HCT: 43.2 % (ref 36.0–46.0)
Hemoglobin: 16.1 g/dL — ABNORMAL HIGH (ref 12.0–15.0)
Immature Granulocytes: 1 %
Lymphocytes Relative: 31 %
Lymphs Abs: 1.5 10*3/uL (ref 0.7–4.0)
MCH: 33.5 pg (ref 26.0–34.0)
MCHC: 37.3 g/dL — ABNORMAL HIGH (ref 30.0–36.0)
MCV: 90 fL (ref 80.0–100.0)
Monocytes Absolute: 0.5 10*3/uL (ref 0.1–1.0)
Monocytes Relative: 10 %
Neutro Abs: 2.6 10*3/uL (ref 1.7–7.7)
Neutrophils Relative %: 51 %
Platelet Count: 126 10*3/uL — ABNORMAL LOW (ref 150–400)
RBC: 4.8 MIL/uL (ref 3.87–5.11)
RDW: 11.9 % (ref 11.5–15.5)
WBC Count: 4.9 10*3/uL (ref 4.0–10.5)
nRBC: 0 % (ref 0.0–0.2)

## 2022-09-10 LAB — VITAMIN B12: Vitamin B-12: 667 pg/mL (ref 180–914)

## 2022-09-10 LAB — IMMATURE PLATELET FRACTION: Immature Platelet Fraction: 8.6 % (ref 1.2–8.6)

## 2022-09-10 LAB — FOLATE: Folate: 7.5 ng/mL (ref >5.9)

## 2022-09-10 NOTE — Assessment & Plan Note (Signed)
Lab review: 10/04/2020: WBC 4.9, hemoglobin 15.1, platelets 177 04/04/2021: WBC 4.2, hemoglobin 15.4, platelets 139 07/21/2022: WBC 3.7, hemoglobin 15.1, platelets 111  Mild thrombocytopenia: Platelet count 128 on 08/21/2016; 129 on 09/19/2016, 144 in February 2009 Remainder of the CBC with differential was normal, hemoglobin 15.9, WBC 4.8 with normal differential  Differential diagnosis: 1. Low-grade ITP 2. medication induced: On further review patient is not taking any medications that are associated with thrombocytopenia 3. Bone marrow factors 4. Hepatitis B/C 5. Splenomegaly: No enlarged spleen was palpable to physical exam.  I discussed with the patient that the level of thrombocytopenia is very mild and that these levels, there are usually no adverse effects. There is usually no risk of bleeding and hence it can be observed without making any changes to patient's medications or requiring any further investigations like bone marrow biopsies.  I recommended watchful monitoring. We can see the patient back in 6 months with labs and follow-up.

## 2022-09-10 NOTE — Progress Notes (Signed)
[place] NOTE  Patient Care Team: [place], Betty G, MD as PCP - General (Family Medicine) Sherren Mocha, MD as PCP - Cardiology (Cardiology) Earnie Larsson, [facility] as Pharmacist (Pharmacist)  CHIEF COMPLAINTS/PURPOSE OF CONSULTATION:  Thrombocytopenia  HISTORY OF PRESENTING ILLNESS:  Miranda Wong 77 y.o. female is here because of slowly progressing thrombocytopenia.  She had blood work showing that her platelet count has been slowly declining over time.  Most recent blood work showed a platelet count of 111.  She does not have any bruising or bleeding problems.  She has not had any major health issues.  She has had a lot of stress.  Her son died last year because of an accidental drug overdose.  Her husband passed away about 7 years ago.  I reviewed her records extensively and collaborated the history with the patient.   MEDICAL HISTORY:  Past Medical History:  Diagnosis Date   Allergy    CAD (coronary artery disease)    cath 06/02/2015 25% mid RCA, 25% prox LCx, 100% prox LAD treated with DES (3.0 x 15 mm Xience), EF 40%   Chicken pox    Former smoker    Heart disease    Hyperlipidemia    Hypertension    Mild ichemic cardiomyopathy, EF 45-50% post cath 06/2015 06/04/2015   NSTEMI (non-ST elevated myocardial infarction) ([place]) 06/02/2015   Type 2 diabetes mellitus without complication ([place]) 85/27/7824    SURGICAL HISTORY: Past Surgical History:  Procedure Laterality Date   CARDIAC CATHETERIZATION N/A 06/02/2015   Procedure: Left Heart Cath and Coronary Angiography;  Surgeon: Sherren Mocha, MD;  Location: [place] [facility];  Service: Cardiovascular;  Laterality: N/A;   CHOLECYSTECTOMY     GALLBLADDER SURGERY     TUBAL LIGATION      SOCIAL HISTORY: Social History   Socioeconomic History   Marital status: Widowed    Spouse name: Not on file   Number of children: Not on file   Years of education: Not on file   Highest education level: Not on  file  Occupational History   Not on file  Tobacco Use   Smoking status: Former   Smokeless tobacco: Never   Tobacco comments:    quit when she was 49s  Vaping Use   Vaping Use: Never used  Substance and Sexual Activity   Alcohol use: No    Comment: ocassionally   Drug use: No   Sexual activity: Yes    Birth control/protection: None  Other Topics Concern   Not on file  Social History Narrative   Not on file   Social Determinants of Health   Financial Resource Strain: Low Risk  (12/01/2021)   Overall Financial Resource Strain (CARDIA)    Difficulty of Paying Living Expenses: Not hard at all  Food Insecurity: No Food Insecurity (12/01/2021)   Hunger Vital Sign    Worried About Running Out of Food in the Last Year: Never true    [place] in the Last Year: Never true  Transportation Needs: No Transportation Needs (12/01/2021)   PRAPARE - Hydrologist (Medical): No    Lack of Transportation (Non-Medical): No  Physical Activity: Sufficiently Active (12/01/2021)   Exercise Vital Sign    Days of Exercise per Week: 5 days    Minutes of Exercise per Session: 30 min  Stress: No Stress Concern Present (12/01/2021)   [place]  Feeling of Stress : Not at all  Social Connections: Socially Isolated (12/01/2021)   Social Connection and Isolation Panel [NHANES]    Frequency of Communication with Friends and Family: More than three times a week    Frequency of Social Gatherings with Friends and Family: More than three times a week    Attends Religious Services: Never    Marine scientist or Organizations: No    Attends Archivist Meetings: Never    Marital Status: Widowed  Intimate Partner Violence: Not At Risk (12/01/2021)   Humiliation, Afraid, Rape, and Kick questionnaire    Fear of Current or Ex-Partner: No    Emotionally Abused: No    Physically Abused: No    Sexually  Abused: No    FAMILY HISTORY: Family History  Problem Relation Age of Onset   Valvular heart disease Mother    Breast cancer Mother        age 77   Colon polyps Mother    Irritable bowel syndrome Mother    GER disease Mother    Ulcers Mother    Heart attack Father    Colon polyps Father    Throat cancer Maternal Uncle    Colon cancer Neg Hx     ALLERGIES:  is allergic to atorvastatin.  MEDICATIONS:  Current Outpatient Medications  Medication Sig Dispense Refill   Alirocumab (PRALUENT) 75 MG/ML SOAJ INJECT 1 ML INTO THE SKIN EVERY 14 (FOURTEEN) DAYS. 6 mL 3   aspirin EC 81 MG EC tablet Take 1 tablet (81 mg total) by mouth daily.     b complex vitamins tablet Take 1 tablet by mouth every other day.     carvedilol (COREG) 6.25 MG tablet Take 1 tablet (6.25 mg total) by mouth 2 (two) times daily with a meal. 180 tablet 3   fluticasone (FLONASE) 50 MCG/ACT nasal spray Place 2 sprays into both nostrils as needed for allergies or rhinitis.     hydrochlorothiazide (HYDRODIURIL) 25 MG tablet Take 0.5 tablets (12.5 mg total) by mouth daily. 45 tablet 3   hydroxypropyl methylcellulose / hypromellose (ISOPTO TEARS / GONIOVISC) 2.5 % ophthalmic solution Place 1 drop into both eyes as needed for dry eyes.     lisinopril (ZESTRIL) 10 MG tablet TAKE 1 TABLET BY MOUTH EVERY MORNING AND AT BEDTIME 180 tablet 0   nitroGLYCERIN (NITROSTAT) 0.4 MG SL tablet Place 1 tablet (0.4 mg total) under the tongue every 5 (five) minutes x 3 doses as needed for chest pain. 25 tablet 3   VITAMIN D PO Take 2 tablets by mouth daily.     No current facility-administered medications for this visit.    REVIEW OF SYSTEMS:   Constitutional: Denies fevers, chills or abnormal night sweats All other systems were reviewed with the patient and are negative.  PHYSICAL EXAMINATION: ECOG PERFORMANCE STATUS: 1 - Symptomatic but completely ambulatory  Vitals:   09/10/22 1541  BP: (!) 147/70  Pulse: 62  Temp: 98.6 F  (37 C)  SpO2: 96%   Filed Weights   09/10/22 1541  Weight: 215 lb 3.2 oz (97.6 kg)    GENERAL:alert, no distress and comfortable  LABORATORY DATA:  I have reviewed the data as listed Lab Results  Component Value Date   WBC 3.7 (L) 07/21/2022   HGB 15.1 (H) 07/21/2022   HCT 43.5 07/21/2022   MCV 94.6 07/21/2022   PLT 111.0 (L) 07/21/2022   Lab Results  Component Value Date   NA 138 07/21/2022  K 3.9 07/21/2022   CL 104 07/21/2022   CO2 26 07/21/2022    RADIOGRAPHIC STUDIES: I have personally reviewed the radiological reports and agreed with the findings in the report.  ASSESSMENT AND PLAN:  Thrombocytopenia ([place]) Lab review: 10/04/2020: WBC 4.9, hemoglobin 15.1, platelets 177 04/04/2021: WBC 4.2, hemoglobin 15.4, platelets 139 07/21/2022: WBC 3.7, hemoglobin 15.1, platelets 111  Differential diagnosis: 1. Low-grade ITP 2. medication induced: On further review patient is not taking any medications that are associated with thrombocytopenia 3. Bone marrow factors 4. Hepatitis B/C 5. Splenomegaly: No enlarged spleen was palpable to physical exam. 6.  Spurious thrombocytopenia  We will check immature platelet fraction along with CBC with differential and the S16 and folic acid levels. I will call her tomorrow with results of this test.  I discussed with the patient that the level of thrombocytopenia is very mild and that these levels, there are usually no adverse effects. There is usually no risk of bleeding and hence it can be observed without making any changes to patient's medications or requiring any further investigations like bone marrow biopsies.  She does not require any treatment at this time and can be watched and monitored. If the platelet count comes back normal then it could be platelet clumping issue on the previous blood work.   All questions were answered. The patient knows to call the clinic with any problems, questions or concerns.    Harriette Ohara, MD 09/10/22

## 2022-10-25 ENCOUNTER — Other Ambulatory Visit: Payer: Self-pay | Admitting: Cardiovascular Disease

## 2022-10-25 DIAGNOSIS — I2510 Atherosclerotic heart disease of native coronary artery without angina pectoris: Secondary | ICD-10-CM

## 2022-10-25 DIAGNOSIS — I255 Ischemic cardiomyopathy: Secondary | ICD-10-CM

## 2022-12-03 ENCOUNTER — Ambulatory Visit (INDEPENDENT_AMBULATORY_CARE_PROVIDER_SITE_OTHER): Payer: Medicare Other | Admitting: Family Medicine

## 2022-12-03 ENCOUNTER — Encounter: Payer: Self-pay | Admitting: Family Medicine

## 2022-12-03 VITALS — Ht 63.0 in | Wt 215.2 lb

## 2022-12-03 DIAGNOSIS — Z0000 Encounter for general adult medical examination without abnormal findings: Secondary | ICD-10-CM | POA: Diagnosis not present

## 2022-12-03 NOTE — Progress Notes (Signed)
PATIENT CHECK-IN and HEALTH RISK ASSESSMENT QUESTIONNAIRE:  -completed by phone/video for upcoming Medicare Preventive Visit  Pre-Visit Check-in: 1)Vitals (height, wt, BP, etc) - record in vitals section for visit on day of visit 2)Review and Update Medications, Allergies PMH, Surgeries, Social history in Epic 3)Hospitalizations in the last year with date/reason? No  4)Review and Update Care Team (patient's specialists) in Epic 5) Complete PHQ9 in Epic  6) Complete Fall Screening in Epic 7)Review all Health Maintenance Due and order under PCP if not done.  8)Medicare Wellness Questionnaire: Answer theses question about your habits: Do you drink alcohol? Very little If yes, how many drinks do you have a day? Less than a drink when she drinks Have you ever smoked?Yes Quit date if applicable? 45 years ago  How many packs a day do/did you smoke? 1 pack a week Do you use smokeless tobacco? No Do you use an illicit drugs?No Do you exercises? Yes IF so, what type and how many days/minutes per week? Walking and daily activities - stays busy and active, works her legs a lot - even just waiting for microwave she moves and she walks the dog Are you sexually active? No Number of partners? 0 Reports is very picky and does not want to change her diet - she does like fruit and eats that Typical breakfast: N/A Typical lunch N/A Typical dinner: Varies Typical snacks: dark Chocolate  Beverages: Water, Pepsi  Answer theses question about you: Can you perform most household chores?Yes Do you find it hard to follow a conversation in a noisy room? No Do you often ask people to speak up or repeat themselves? Occasionally Do you feel that you have a problem with memory? Not really of concern Do you balance your checkbook and or bank acounts? Yes Do you feel safe at home? Yes Last dentist visit? 3 years ago  Do you need assistance with any of the following: Please note if so No  Driving?  Feeding  yourself?  Getting from bed to chair?  Getting to the toilet?  Bathing or showering?  Dressing yourself?  Managing money?  Climbing a flight of stairs  Preparing meals?  Do you have Advanced Directives in place (Living Will, Healthcare Power or Attorney)?  No - she plans to set this up, she has the paperwork    Last eye Exam and location? September 22 2021 [facility]    Do you currently use prescribed or non-prescribed narcotic or opioid pain medications? No   Do you have a history or close family history of breast, ovarian, tubal or peritoneal cancer or a family member with BRCA (breast cancer susceptibility 1 and 2) gene mutations? Yes  Nurse/Assistant Credentials/time stamp: MG 12:23 PM    ----------------------------------------------------------------------------------------------------------------------------------------------------------------------------------------------------------------------   MEDICARE ANNUAL PREVENTIVE VISIT WITH PROVIDER: (Welcome to [employer], initial annual wellness or annual wellness exam)  Virtual Visit via Phone Note  I connected with Miranda Wong on 12/03/22 by phone and verified that I am speaking with the correct person using two identifiers.  Location patient: home Location provider:work or home office Persons participating in the virtual visit: patient, provider  Concerns and/or follow up today: no new concerns   See HM section in Epic for other details of completed HM.    ROS: negative for report of fevers, unintentional weight loss, vision changes, vision loss, hearing loss or change, chest pain, sob, hemoptysis, melena, hematochezia, hematuria, falls, bleeding or bruising, thoughts of suicide or self harm, memory loss  Patient-completed extensive health  risk assessment - reviewed and discussed with the patient: See Health Risk Assessment completed with patient prior to the visit either above or in recent phone note. This was  reviewed in detailed with the patient today and appropriate recommendations, orders and referrals were placed as needed per Summary below and patient instructions.   Review of Medical History: -PMH, [place], Family History and current specialty and care providers reviewed and updated and listed below   Patient Care Team: Martinique, Betty G, MD as PCP - General (Family Medicine) Sherren Mocha, MD as PCP - Cardiology (Cardiology) Earnie Larsson, [facility] as Pharmacist (Pharmacist)   Past Medical History:  Diagnosis Date   Allergy    CAD (coronary artery disease)    cath 06/02/2015 25% mid RCA, 25% prox LCx, 100% prox LAD treated with DES (3.0 x 15 mm Xience), EF 40%   Chicken pox    Former smoker    Heart disease    Hyperlipidemia    Hypertension    Mild ichemic cardiomyopathy, EF 45-50% post cath 06/2015 06/04/2015   NSTEMI (non-ST elevated myocardial infarction) 06/02/2015   Type 2 diabetes mellitus without complication [id number]    Past Surgical History:  Procedure Laterality Date   CARDIAC CATHETERIZATION N/A 06/02/2015   Procedure: Left Heart Cath and Coronary Angiography;  Surgeon: Sherren Mocha, MD;  Location: [place] [facility];  Service: Cardiovascular;  Laterality: N/A;   CHOLECYSTECTOMY     GALLBLADDER SURGERY     TUBAL LIGATION      Social History   Socioeconomic History   Marital status: Widowed    Spouse name: Not on file   Number of children: Not on file   Years of education: Not on file   Highest education level: Not on file  Occupational History   Not on file  Tobacco Use   Smoking status: Former   Smokeless tobacco: Never   Tobacco comments:    quit when she was 27s  Vaping Use   Vaping Use: Never used  Substance and Sexual Activity   Alcohol use: No    Comment: ocassionally   Drug use: No   Sexual activity: Yes    Birth control/protection: None  Other Topics Concern   Not on file  Social History Narrative   Not on file   Social Determinants of  Health   Financial Resource Strain: Low Risk  (12/01/2021)   Overall Financial Resource Strain (CARDIA)    Difficulty of Paying Living Expenses: Not hard at all  Food Insecurity: No Food Insecurity (12/01/2021)   Hunger Vital Sign    Worried About Running Out of Food in the Last Year: Never true    [place] in the Last Year: Never true  Transportation Needs: No Transportation Needs (12/01/2021)   PRAPARE - Hydrologist (Medical): No    Lack of Transportation (Non-Medical): No  Physical Activity: Sufficiently Active (12/01/2021)   Exercise Vital Sign    Days of Exercise per Week: 5 days    Minutes of Exercise per Session: 30 min  Stress: No Stress Concern Present (12/01/2021)   [place]    Feeling of Stress : Not at all  Social Connections: Socially Isolated (12/01/2021)   Social Connection and Isolation Panel [NHANES]    Frequency of Communication with Friends and Family: More than three times a week    Frequency of Social Gatherings with Friends and Family: More than three times  a week    Attends Religious Services: Never    Active Member of Clubs or Organizations: No    Attends Archivist Meetings: Never    Marital Status: Widowed  Intimate Partner Violence: Not At Risk (12/01/2021)   Humiliation, Afraid, Rape, and Kick questionnaire    Fear of Current or Ex-Partner: No    Emotionally Abused: No    Physically Abused: No    Sexually Abused: No    Family History  Problem Relation Age of Onset   Valvular heart disease Mother    Breast cancer Mother        age 17   Colon polyps Mother    Irritable bowel syndrome Mother    GER disease Mother    Ulcers Mother    Heart attack Father    Colon polyps Father    Throat cancer Maternal Uncle    Colon cancer Neg Hx     Current Outpatient Medications on File Prior to Visit  Medication Sig Dispense Refill   aspirin EC 81 MG EC  tablet Take 1 tablet (81 mg total) by mouth daily.     b complex vitamins tablet Take 1 tablet by mouth every other day.     carvedilol (COREG) 6.25 MG tablet TAKE 1 TABLET BY MOUTH 2 TIMES DAILY WITH A MEAL. 180 tablet 3   fluticasone (FLONASE) 50 MCG/ACT nasal spray Place 2 sprays into both nostrils as needed for allergies or rhinitis.     hydrochlorothiazide (HYDRODIURIL) 25 MG tablet Take 0.5 tablets (12.5 mg total) by mouth daily. 45 tablet 3   hydroxypropyl methylcellulose / hypromellose (ISOPTO TEARS / GONIOVISC) 2.5 % ophthalmic solution Place 1 drop into both eyes as needed for dry eyes.     lisinopril (ZESTRIL) 10 MG tablet TAKE 1 TABLET BY MOUTH EVERY MORNING AND AT BEDTIME 180 tablet 0   VITAMIN D PO Take 2 tablets by mouth daily.     Alirocumab (PRALUENT) 75 MG/ML SOAJ INJECT 1 ML INTO THE SKIN EVERY 14 (FOURTEEN) DAYS. (Patient not taking: Reported on 12/03/2022) 6 mL 3   nitroGLYCERIN (NITROSTAT) 0.4 MG SL tablet Place 1 tablet (0.4 mg total) under the tongue every 5 (five) minutes x 3 doses as needed for chest pain. (Patient not taking: Reported on 12/03/2022) 25 tablet 3   No current facility-administered medications on file prior to visit.    Allergies  Allergen Reactions   Atorvastatin Other (See Comments)    myalgia       Physical Exam There were no vitals filed for this visit. Estimated body mass index is 38.12 kg/m as calculated from the following:   Height as of this encounter: 5\' 3"  (1.6 m).   Weight as of this encounter: 215 lb 3.2 oz (97.6 kg).  EKG (optional): deferred due to virtual visit  GENERAL: alert, oriented, no acute distress detected, full vision exam deferred due to pandemic and/or virtual encounter  PSYCH/NEURO: pleasant and cooperative, no obvious depression or anxiety, speech and thought processing grossly intact, Cognitive function grossly intact  Flowsheet Row Office Visit from 12/03/2022 in [place] [facility]  PHQ-9  Total Score 0           12/03/2022   12:11 PM 07/21/2022    9:28 AM 01/21/2022    8:01 AM 12/01/2021    1:31 PM 10/04/2020    7:51 AM  Depression screen PHQ 2/9  Decreased Interest 0 0 0 0 0  Down, Depressed, Hopeless 0  0 0 0 0  PHQ - 2 Score 0 0 0 0 0  Altered sleeping 0      Tired, decreased energy 0      Change in appetite 0      Feeling bad or failure about yourself  0      Trouble concentrating 0      Moving slowly or fidgety/restless 0      Suicidal thoughts 0      PHQ-9 Score 0      Difficult doing work/chores Not difficult at all           10/04/2020    7:50 AM 12/01/2021    1:34 PM 01/21/2022    8:02 AM 07/21/2022    9:28 AM 12/03/2022   12:14 PM  Fall Risk  Falls in the past year? 0 0 0 0 0  Was there an injury with Fall? 0 0 0 0 0  Fall Risk Category Calculator 0 0 0 0 0  Fall Risk Category (Retired) Low Low Low Low   (RETIRED) Patient Fall Risk Level Low fall risk Low fall risk Low fall risk Low fall risk   Patient at Risk for Falls Due to  No Fall Risks No Fall Risks Other (Comment) No Fall Risks  Fall risk Follow up Education provided  Falls evaluation completed Falls evaluation completed Falls evaluation completed     SUMMARY AND PLAN:  Encounter for Medicare annual wellness exam    Discussed applicable health maintenance/preventive health measures and advised and referred or ordered per patient preferences:  Health Maintenance  Topic Date Due   DTaP/Tdap/Td (1 - Tdap) Considering, plans to get at pharmacy, advised to please bring copy of record to update at our office   COVID-19 Vaccine (4 - 2023-24 season) 05/01/2022, discussed, she did not realize there was another booster, discussed current recommendations and advised can get at phramacy   OPHTHALMOLOGY EXAM  03/04/2023 (Originally 08/14/2022) - she reports she plans to schedule this   Zoster Vaccines- Shingrix (1 of 2) 03/03/2028 (Originally 01/20/1965) - discussed, considering   HEMOGLOBIN A1C   01/19/2023   Diabetic kidney evaluation - Urine ACR  01/22/2023   FOOT EXAM  01/22/2023   INFLUENZA VACCINE  04/01/2023   Diabetic kidney evaluation - eGFR measurement  07/22/2023   Medicare Annual Wellness (AWV)  12/03/2023   MAMMOGRAM  02/07/2024   Fecal DNA (Cologuard)  11/17/2024   Pneumonia Vaccine 28+ Years old  Completed   DEXA SCAN  Completed in 2021 - she declined for now   HPV VACCINES  Aged Out   COLONOSCOPY (Pts 45-72yrs Insurance coverage will need to be confirmed)  Discontinued   Hepatitis C Screening  Discontinued   Education and counseling on the following was provided based on the above review of health and a plan/checklist for the patient, along with additional information discussed, was provided for the patient in the patient instructions :  -Advised on importance of completing advanced directives, discussed options for completing and provided information in patient instructions as well -She does not wish to repeat bone density right now. Discussed vit d, adequate calcium, weight bearing exercise and fall prevention. Reviewed  safe balance exercises that can be done at home to improve balance and discussed exercise guidelines for adults with include balance exercises at least 3 days per week.  -Advised and counseled on a healthy lifestyle - including the importance of a healthy diet, regular physical activity, social connections and stress management. -Reviewed patient's  current diet. Advised and counseled -  A summary of a healthy diet was provided in the Patient Instructions.  -reviewed patient's current physical activity level and discussed exercise guidelines for adults. Discussed resources and ideas for safe exercise at home to assist in meeting exercise guideline recommendations in a safe and healthy way.  -Advise yearly dental visits at minimum and regular eye exams -Advised and counseled on alcohol safe limits  Follow up: see patient instructions     Patient  Instructions  I really enjoyed getting to talk with you today! I am available on Tuesdays and Thursdays for virtual visits if you have any questions or concerns, or if I can be of any further assistance.   CHECKLIST FROM ANNUAL WELLNESS VISIT:  -Follow up (please call to schedule if not scheduled after visit):   Please schedule in office follow up with Dr. Martinique in May   -yearly for annual wellness visit with primary care office  Here is a list of your preventive care/health maintenance measures and the plan for each if any are due:  PLAN For any measures below that may be due:   Health Maintenance  Topic Date Due   DTaP/Tdap/Td (1 - Tdap) Never done   COVID-19 Vaccine (4 - 2023-24 season) 05/01/2022   OPHTHALMOLOGY EXAM  03/04/2023 (Originally 08/14/2022)   Zoster Vaccines- Shingrix (1 of 2) 03/03/2028 (Originally 01/20/1965)   HEMOGLOBIN A1C  01/19/2023   Diabetic kidney evaluation - Urine ACR  01/22/2023   FOOT EXAM  01/22/2023   INFLUENZA VACCINE  04/01/2023   Diabetic kidney evaluation - eGFR measurement  07/22/2023   Medicare Annual Wellness (AWV)  12/03/2023   MAMMOGRAM  02/07/2024   Fecal DNA (Cologuard)  11/17/2024   Pneumonia Vaccine 16+ Years old  Completed   DEXA SCAN  Completed   HPV VACCINES  Aged Out   COLONOSCOPY (Pts 45-14yrs Insurance coverage will need to be confirmed)  Discontinued   Hepatitis C Screening  Discontinued    -See a dentist at least yearly  -Get your eyes checked and then per your eye specialist's recommendations  -Other issues addressed today:   -I have included below further information regarding a healthy whole foods based diet, physical activity guidelines for adults, stress management and opportunities for social connections. I hope you find this information useful.    -----------------------------------------------------------------------------------------------------------------------------------------------------------------------------------------------------------------------------------------------------------  NUTRITION: -eat real food: lots of colorful vegetables (half the plate) and fruits -5-7 servings of vegetables and fruits per day (fresh or steamed is best), exp. 2 servings of vegetables with lunch and dinner and 2 servings of fruit per day. Berries and greens such as kale and collards are great choices.  -consume on a regular basis: whole grains (make sure first ingredient on label contains the word "whole"), fresh fruits, fish, nuts, seeds, healthy oils (such as olive oil, avocado oil, grape seed oil) -may eat small amounts of dairy and lean meat on occasion, but avoid processed meats such as ham, bacon, lunch meat, etc. -drink water -try to avoid fast food and pre-packaged foods, processed meat -most experts advise limiting sodium to < 2300mg  per day, should limit further is any chronic conditions such as high blood pressure, heart disease, diabetes, etc. The American Heart Association advised that < 1500mg  is is ideal -try to avoid foods that contain any ingredients with names you do not recognize  -try to avoid sugar/sweets (except for the natural sugar that occurs in fresh fruit) -try to avoid sweet drinks -try to avoid white  rice, white bread, pasta (unless whole grain), white or yellow potatoes  EXERCISE GUIDELINES FOR ADULTS: -if you wish to increase your physical activity, do so gradually and with the approval of your doctor -STOP and seek medical care immediately if you have any chest pain, chest discomfort or trouble breathing when starting or increasing exercise  -move and stretch your body, legs, feet and arms when sitting for long periods -Physical activity guidelines for optimal health in adults: -least 150 minutes per week of  aerobic exercise (can talk, but not sing) once approved by your doctor, 20-30 minutes of sustained activity or two 10 minute episodes of sustained activity every day.  -resistance training at least 2 days per week if approved by your doctor -balance exercises 3+ days per week:   Stand somewhere where you have something sturdy to hold onto if you lose balance.    1) lift up on toes, start with 5x per day and work up to 20x   2) stand and lift on leg straight out to the side so that foot is a few inches of the floor, start with 5x each side and work up to 20x each side   3) stand on one foot, start with 5 seconds each side and work up to 20 seconds on each side  If you need ideas or help with getting more active:  -Silver sneakers https://tools.silversneakers.com  -Walk with a Doc: http://stephens-thompson.biz/  -try to include resistance (weight lifting/strength building) and balance exercises twice per week: or the following link for ideas: ChessContest.fr  UpdateClothing.com.cy  STRESS MANAGEMENT: -can try meditating, or just sitting quietly with deep breathing while intentionally relaxing all parts of your body for 5 minutes daily -if you need further help with stress, anxiety or depression please follow up with your primary doctor or contact the wonderful folks at [place]: [place]: -options in [place] if you wish to engage in more social and exercise related activities:  -Silver sneakers https://tools.silversneakers.com  -Walk with a Doc: http://stephens-thompson.biz/  -Check out the [place] 50+ section on the [place] [employer] (hiking clubs, book clubs, cards and games, chess, exercise classes, aquatic classes and much more) - see the website for  details: https://www.[place].gov/departments/parks-recreation/active-adults50  -YouTube has lots of exercise videos for different ages and abilities as well  -[place] (a variety of indoor and outdoor inperson activities for adults). (445)179-8860. [address]  -Virtual Online Classes (a variety of topics): see seniorplanet.org or call 3011091675  -consider volunteering at a school, hospice center, church, senior center or elsewhere  ADVANCED HEALTHCARE DIRECTIVES:  Everyone should have advanced health care directives in place. This is so that you get the care you want, should you ever be in a situation where you are unable to make your own medical decisions.   From the  Advanced Directive Website: "[place] are legal documents in which you give written instructions about your health care if, in the future, you cannot speak for yourself.   A health care power of attorney allows you to name a person you trust to make your health care decisions if you cannot make them yourself. A declaration of a desire for a natural death (or living will) is document, which states that you desire not to have your life prolonged by extraordinary measures if you have a terminal or incurable illness or if you are in a vegetative state. An advance instruction for mental health treatment makes  a declaration of instructions, information and preferences regarding your mental health treatment. It also states that you are aware that the advance instruction authorizes a mental health treatment provider to act according to your wishes. It may also outline your consent or refusal of mental health treatment. A declaration of an anatomical gift allows anyone over the age of 14 to make a gift by will, organ donor card or other document."   Please see the following website or an elder law attorney for forms, FAQs and for completion of advanced directives: [place] [employer] [place] ([email or web address].no)  Or copy and paste the following to your web browser: PokerReunion.com.cy           Lucretia Kern, DO

## 2022-12-03 NOTE — Patient Instructions (Addendum)
I really enjoyed getting to talk with you today! I am available on Tuesdays and Thursdays for virtual visits if you have any questions or concerns, or if I can be of any further assistance.   CHECKLIST FROM ANNUAL WELLNESS VISIT:  -Follow up (please call to schedule if not scheduled after visit):   Please schedule in office follow up with Dr. Martinique in May   -yearly for annual wellness visit with primary care office  Here is a list of your preventive care/health maintenance measures and the plan for each if any are due:  PLAN For any measures below that may be due:   Health Maintenance  Topic Date Due   DTaP/Tdap/Td (1 - Tdap) Never done   COVID-19 Vaccine (4 - 2023-24 season) 05/01/2022   OPHTHALMOLOGY EXAM  03/04/2023 (Originally 08/14/2022)   Zoster Vaccines- Shingrix (1 of 2) 03/03/2028 (Originally 01/20/1965)   HEMOGLOBIN A1C  01/19/2023   Diabetic kidney evaluation - Urine ACR  01/22/2023   FOOT EXAM  01/22/2023   INFLUENZA VACCINE  04/01/2023   Diabetic kidney evaluation - eGFR measurement  07/22/2023   Medicare Annual Wellness (AWV)  12/03/2023   MAMMOGRAM  02/07/2024   Fecal DNA (Cologuard)  11/17/2024   Pneumonia Vaccine 9+ Years old  Completed   DEXA SCAN  Completed   HPV VACCINES  Aged Out   COLONOSCOPY (Pts 45-5yrs Insurance coverage will need to be confirmed)  Discontinued   Hepatitis C Screening  Discontinued    -See a dentist at least yearly  -Get your eyes checked and then per your eye specialist's recommendations  -Other issues addressed today:   -I have included below further information regarding a healthy whole foods based diet, physical activity guidelines for adults, stress management and opportunities for social connections. I hope you find this information useful.    -----------------------------------------------------------------------------------------------------------------------------------------------------------------------------------------------------------------------------------------------------------  NUTRITION: -eat real food: lots of colorful vegetables (half the plate) and fruits -5-7 servings of vegetables and fruits per day (fresh or steamed is best), exp. 2 servings of vegetables with lunch and dinner and 2 servings of fruit per day. Berries and greens such as kale and collards are great choices.  -consume on a regular basis: whole grains (make sure first ingredient on label contains the word "whole"), fresh fruits, fish, nuts, seeds, healthy oils (such as olive oil, avocado oil, grape seed oil) -may eat small amounts of dairy and lean meat on occasion, but avoid processed meats such as ham, bacon, lunch meat, etc. -drink water -try to avoid fast food and pre-packaged foods, processed meat -most experts advise limiting sodium to < 2300mg  per day, should limit further is any chronic conditions such as high blood pressure, heart disease, diabetes, etc. The American Heart Association advised that < 1500mg  is is ideal -try to avoid foods that contain any ingredients with names you do not recognize  -try to avoid sugar/sweets (except for the natural sugar that occurs in fresh fruit) -try to avoid sweet drinks -try to avoid white rice, white bread, pasta (unless whole grain), white or yellow potatoes  EXERCISE GUIDELINES FOR ADULTS: -if you wish to increase your physical activity, do so gradually and with the approval of your doctor -STOP and seek medical care immediately if you have any chest pain, chest discomfort or trouble breathing when starting or increasing exercise  -move and stretch your body, legs, feet and arms when sitting for long periods -Physical activity guidelines for optimal health in adults: -least 150 minutes per week of  aerobic exercise (can talk, but not sing) once approved by your doctor, 20-30 minutes of sustained activity or two 10 minute episodes of sustained activity every day.  -resistance training at least 2 days per week if approved by your doctor -balance exercises 3+ days per week:   Stand somewhere where you have something sturdy to hold onto if you lose balance.    1) lift up on toes, start with 5x per day and work up to 20x   2) stand and lift on leg straight out to the side so that foot is a few inches of the floor, start with 5x each side and work up to 20x each side   3) stand on one foot, start with 5 seconds each side and work up to 20 seconds on each side  If you need ideas or help with getting more active:  -Silver sneakers https://tools.silversneakers.com  -Walk with a Doc: http://stephens-thompson.biz/  -try to include resistance (weight lifting/strength building) and balance exercises twice per week: or the following link for ideas: ChessContest.fr  UpdateClothing.com.cy  STRESS MANAGEMENT: -can try meditating, or just sitting quietly with deep breathing while intentionally relaxing all parts of your body for 5 minutes daily -if you need further help with stress, anxiety or depression please follow up with your primary doctor or contact the wonderful folks at [place]: [place]: -options in [place] if you wish to engage in more social and exercise related activities:  -Silver sneakers https://tools.silversneakers.com  -Walk with a Doc: http://stephens-thompson.biz/  -Check out the [place] 50+ section on the [place] [employer] (hiking clubs, book clubs, cards and games, chess, exercise classes, aquatic classes and much more) - see the website for  details: https://www.[place].gov/departments/parks-recreation/active-adults50  -YouTube has lots of exercise videos for different ages and abilities as well  -[place] (a variety of indoor and outdoor inperson activities for adults). [phone]. [address]  -Virtual Online Classes (a variety of topics): see seniorplanet.org or call [phone]  -consider volunteering at a school, hospice center, church, senior center or elsewhere  ADVANCED HEALTHCARE DIRECTIVES:  Everyone should have advanced health care directives in place. This is so that you get the care you want, should you ever be in a situation where you are unable to make your own medical decisions.   From the [place] Advanced Directive Website: "[place] are legal documents in which you give written instructions about your health care if, in the future, you cannot speak for yourself.   A health care power of attorney allows you to name a person you trust to make your health care decisions if you cannot make them yourself. A declaration of a desire for a natural death (or living will) is document, which states that you desire not to have your life prolonged by extraordinary measures if you have a terminal or incurable illness or if you are in a vegetative state. An advance instruction for mental health treatment makes a declaration of instructions, information and preferences regarding your mental health treatment. It also states that you are aware that the advance instruction authorizes a mental health treatment provider to act according to your wishes. It may also outline your consent or refusal of mental health treatment. A declaration of an anatomical gift allows anyone over the age of 30 to make a gift by will, organ donor card or other document."   Please see the following website or an elder law attorney for forms,  FAQs and for completion of advanced directives: [place] ([email or web address].no)  Or copy and paste the following to your web browser: [email or web address].cy

## 2022-12-09 ENCOUNTER — Other Ambulatory Visit: Payer: Self-pay | Admitting: Cardiovascular Disease

## 2022-12-09 DIAGNOSIS — I10 Essential (primary) hypertension: Secondary | ICD-10-CM

## 2022-12-09 DIAGNOSIS — I2510 Atherosclerotic heart disease of native coronary artery without angina pectoris: Secondary | ICD-10-CM

## 2022-12-22 ENCOUNTER — Telehealth: Payer: Self-pay | Admitting: Cardiovascular Disease

## 2022-12-22 DIAGNOSIS — E782 Mixed hyperlipidemia: Secondary | ICD-10-CM

## 2022-12-22 DIAGNOSIS — I2510 Atherosclerotic heart disease of native coronary artery without angina pectoris: Secondary | ICD-10-CM

## 2022-12-22 NOTE — Telephone Encounter (Signed)
Pt c/o medication issue:  1. Name of Medication: Alirocumab (PRALUENT) 75 MG/ML SOAJ   2. How are you currently taking this medication (dosage and times per day)? As prescribed  3. Are you having a reaction (difficulty breathing--STAT)?   4. What is your medication issue? Patient is requesting call back in regards to this medication. She states that insurance is not wanting to cover and she would like a call back to discuss her options. Please advise.

## 2022-12-23 ENCOUNTER — Other Ambulatory Visit (HOSPITAL_COMMUNITY): Payer: Self-pay

## 2022-12-23 NOTE — Telephone Encounter (Signed)
Per test claim no PA  required currently has one on file. Copay is high due to high deductible plan  Also got a rejection, that we are a non preferred pharmacy. Patient should check with plan to see where insurance would prefer the scripts be filled at.

## 2022-12-24 ENCOUNTER — Encounter: Payer: Self-pay | Admitting: Pharmacist

## 2022-12-24 MED ORDER — PRALUENT 75 MG/ML ~~LOC~~ SOAJ: 1.0000 mL | SUBCUTANEOUS | 3 refills | Status: DC

## 2022-12-24 NOTE — Telephone Encounter (Signed)
Able to re-enroll pt in [facility] grant, info sent to pt in [place] message and left her a detailed message. Also added grant info to rx refill I sent in.

## 2022-12-28 ENCOUNTER — Other Ambulatory Visit: Payer: Self-pay | Admitting: Family Medicine

## 2022-12-28 DIAGNOSIS — Z1231 Encounter for screening mammogram for malignant neoplasm of breast: Secondary | ICD-10-CM

## 2023-01-20 NOTE — Progress Notes (Unsigned)
HPI: [name] is a 77 y.o. female, who is here today for chronic disease management.  Last seen on 07/21/2022. Since her last visit, she saw hematologist, reassured about thrombocytopenia,She would like to have it re-check today.  Hypertension: She is on Lisinopril 10 mg daily , carvedilol 6.25 mg bid, and HCTZ 25 mg daily. She does not check BP. Negative for unusual or severe headache, visual changes, exertional chest pain, dyspnea,  focal weakness, or edema. CKD III: She has not noted gross hematuria,foam in urine,or decreased urine output. Vit D def on vit D supplementation, 2000 U daily. Last 25 OH vit D in 07/2022 was 24.9.  Lab Results  Component Value Date   CREATININE 0.93 07/21/2022   BUN 9 07/21/2022   NA 138 07/21/2022   K 3.9 07/21/2022   CL 104 07/21/2022   CO2 26 07/21/2022   Diabetes Mellitus II: Diagnosed in 07/2018.  She is on non pharmacologic treatment. Doe snot check BS's regularly. She has made some dietary modifications, including switching from regular soda to zero-sugar options, transitioning from white to wheat bread, and attempting to reduce her potato consumption.  Eye exam: 07/2022. Foot exam: 12/2021. Negative for symptoms of hypoglycemia, polyuria, polydipsia, numbness extremities, foot ulcers/trauma HgA1C 7.7 in 07/2022.  Lab Results  Component Value Date   MICROALBUR 0.8 01/21/2022   HLD on Praluent 75 mg q 2 weeks. She did not tolerate statin meds.  Lab Results  Component Value Date   CHOL 138 01/21/2022   HDL 55.40 01/21/2022   LDLCALC 56 01/21/2022   TRIG 134.0 01/21/2022   CHOLHDL 2 01/21/2022   Review of Systems  Constitutional:  Negative for chills and fever.  HENT:  Negative for mouth sores, nosebleeds and sore throat.   Respiratory:  Negative for cough and wheezing.   Gastrointestinal:  Negative for abdominal pain, nausea and vomiting.  Musculoskeletal:  Negative for gait problem and myalgias.  Skin:  Negative  for rash.  Neurological:  Negative for syncope and facial asymmetry.  See other pertinent positives and negatives in HPI.  Current Outpatient Medications on File Prior to Visit  Medication Sig Dispense Refill   Alirocumab (PRALUENT) 75 MG/ML SOAJ Inject 1 mL (75 mg total) into the skin every 14 (fourteen) days. 6 mL 3   aspirin EC 81 MG EC tablet Take 1 tablet (81 mg total) by mouth daily.     b complex vitamins tablet Take 1 tablet by mouth every other day.     carvedilol (COREG) 6.25 MG tablet TAKE 1 TABLET BY MOUTH 2 TIMES DAILY WITH A MEAL. 180 tablet 3   fluticasone (FLONASE) 50 MCG/ACT nasal spray Place 2 sprays into both nostrils as needed for allergies or rhinitis.     hydrochlorothiazide (HYDRODIURIL) 25 MG tablet TAKE 1/2 TABLET BY MOUTH EVERY DAY 45 tablet 3   hydroxypropyl methylcellulose / hypromellose (ISOPTO TEARS / GONIOVISC) 2.5 % ophthalmic solution Place 1 drop into both eyes as needed for dry eyes.     lisinopril (ZESTRIL) 10 MG tablet TAKE 1 TABLET BY MOUTH EVERY MORNING AND AT BEDTIME 180 tablet 0   nitroGLYCERIN (NITROSTAT) 0.4 MG SL tablet Place 1 tablet (0.4 mg total) under the tongue every 5 (five) minutes x 3 doses as needed for chest pain. 25 tablet 3   VITAMIN D PO Take 2 tablets by mouth daily.     No current facility-administered medications on file prior to visit.   Past Medical History:  Diagnosis Date  Allergy    CAD (coronary artery disease)    cath 06/02/2015 25% mid RCA, 25% prox LCx, 100% prox LAD treated with DES (3.0 x 15 mm Xience), EF 40%   Chicken pox    Former smoker    Heart disease    Hyperlipidemia    Hypertension    Mild ichemic cardiomyopathy, EF 45-50% post cath 06/2015 06/04/2015   NSTEMI (non-ST elevated myocardial infarction) (HCC) 06/02/2015   Type 2 diabetes mellitus without complication (HCC) 06/22/2019   Allergies  Allergen Reactions   Atorvastatin Other (See Comments)    myalgia   Social History   Socioeconomic History    Marital status: Widowed    Spouse name: Not on file   Number of children: Not on file   Years of education: Not on file   Highest education level: Not on file  Occupational History   Not on file  Tobacco Use   Smoking status: Former   Smokeless tobacco: Never   Tobacco comments:    quit when she was 30s  Vaping Use   Vaping Use: Never used  Substance and Sexual Activity   Alcohol use: No    Comment: ocassionally   Drug use: No   Sexual activity: Yes    Birth control/protection: None  Other Topics Concern   Not on file  Social History Narrative   Not on file   Social Determinants of Health   Financial Resource Strain: Low Risk  (12/01/2021)   Overall Financial Resource Strain (CARDIA)    Difficulty of Paying Living Expenses: Not hard at all  Food Insecurity: No Food Insecurity (12/01/2021)   Hunger Vital Sign    Worried About Running Out of Food in the Last Year: Never true    Ran Out of Food in the Last Year: Never true  Transportation Needs: No Transportation Needs (12/01/2021)   PRAPARE - Administrator, Civil Service (Medical): No    Lack of Transportation (Non-Medical): No  Physical Activity: Sufficiently Active (12/01/2021)   Exercise Vital Sign    Days of Exercise per Week: 5 days    Minutes of Exercise per Session: 30 min  Stress: No Stress Concern Present (12/01/2021)   [employer] of Occupational Health - Occupational Stress Questionnaire    Feeling of Stress : Not at all  Social Connections: Socially Isolated (12/01/2021)   Social Connection and Isolation Panel [NHANES]    Frequency of Communication with Friends and Family: More than three times a week    Frequency of Social Gatherings with Friends and Family: More than three times a week    Attends Religious Services: Never    Database administrator or Organizations: No    Attends Banker Meetings: Never    Marital Status: Widowed   Vitals:   01/22/23 0925  BP: 120/70  Pulse: 68   Resp: 16  Temp: (!) 97.5 F (36.4 C)  SpO2: 95%   Body mass index is 38 kg/m.  Physical Exam Vitals and nursing note reviewed.  Constitutional:      General: She is not in acute distress.    Appearance: She is well-developed.  HENT:     Head: Normocephalic and atraumatic.     Mouth/Throat:     Mouth: Mucous membranes are moist.     Dentition: Has dentures.     Pharynx: Oropharynx is clear.  Eyes:     Conjunctiva/sclera: Conjunctivae normal.  Cardiovascular:     Rate and Rhythm: Normal  rate and regular rhythm.     Pulses:          Dorsalis pedis pulses are 2+ on the right side and 2+ on the left side.     Heart sounds: No murmur heard. Pulmonary:     Effort: Pulmonary effort is normal. No respiratory distress.     Breath sounds: Normal breath sounds.  Abdominal:     Palpations: Abdomen is soft. There is no hepatomegaly or mass.     Tenderness: There is no abdominal tenderness.  Lymphadenopathy:     Cervical: No cervical adenopathy.  Skin:    General: Skin is warm.     Findings: No erythema or rash.  Neurological:     General: No focal deficit present.     Mental Status: She is alert and oriented to person, place, and time.     Cranial Nerves: No cranial nerve deficit.     Gait: Gait normal.  Psychiatric:        Mood and Affect: Mood and affect normal.   ASSESSMENT AND PLAN:  Miranda Wong was seen today for medical management of chronic issues.  Diagnoses and all orders for this visit: Lab Results  Component Value Date   HGBA1C 6.5 01/22/2023   Lab Results  Component Value Date   CHOL 126 01/22/2023   HDL 53.90 01/22/2023   LDLCALC 53 01/22/2023   TRIG 95.0 01/22/2023   CHOLHDL 2 01/22/2023   Lab Results  Component Value Date   CREATININE 1.03 01/22/2023   BUN 19 01/22/2023   NA 138 01/22/2023   K 4.0 01/22/2023   CL 101 01/22/2023   CO2 25 01/22/2023   Lab Results  Component Value Date   ALT 21 01/22/2023   AST 40 (H) 01/22/2023   ALKPHOS 59  01/22/2023   BILITOT 1.6 (H) 01/22/2023   Lab Results  Component Value Date   WBC 3.7 (L) 01/22/2023   HGB 15.4 (H) 01/22/2023   HCT 45.4 01/22/2023   MCV 94.2 01/22/2023   PLT 108.0 (L) 01/22/2023  Type 2 diabetes mellitus with other specified complication, without long-term current use of insulin (HCC) Assessment & Plan: HgA1C is now at goal. HgA1C went from 7.7 to 6.5. Continue non pharmacologic treatment.. Annual eye exam and foot care to continue. F/U in 5-6 months.  Orders: -     POCT glycosylated hemoglobin (Hb A1C) -     Microalbumin / creatinine urine ratio; Future -     Basic metabolic panel; Future  Essential hypertension Assessment & Plan: BP adequately controlled. Continue lisinopril 10 mg daily, carvedilol 6.25 mg twice daily, and HCTZ 25 mg 1/2 tablet daily and well as low-salt/DASH diet.  Orders: -     Basic metabolic panel; Future -     CBC; Future  Stage 3a chronic kidney disease (HCC) Assessment & Plan: Problem has been stable, e GFR 54-58. Continue adequate glucose and BP controlled and well as low-salt diet and avoidance of NSAIDs.  Orders: -     Basic metabolic panel; Future  Mixed hyperlipidemia Assessment & Plan: Has not tolerated statins in the past and history of elevated transaminases. LDL 56 in 12/2021. Continue Praluent 75 mg every 2 weeks. Following with cardiologist.  Orders: -     Hepatic function panel; Future -     Lipid panel; Future  Vitamin D deficiency, unspecified Assessment & Plan: Continue vit D supplementation. Further recommendations according to 25 OH vit D result.  Orders: -  VITAMIN D 25 Hydroxy (Vit-D Deficiency, Fractures); Future  Statin myopathy Assessment & Plan: Did not tolerate statins. She is currently on Praluent.   Thrombocytopenia (HCC) Assessment & Plan: Evaluated by hematologist,09/10/22. Problem has been otherwise stable for years.   Return in about 6 months (around 07/25/2023) for  chronic problems.  Elene Downum G. [place], MD  [facility]. [place] office.

## 2023-01-22 ENCOUNTER — Encounter: Payer: Self-pay | Admitting: Family Medicine

## 2023-01-22 ENCOUNTER — Ambulatory Visit (INDEPENDENT_AMBULATORY_CARE_PROVIDER_SITE_OTHER): Payer: Medicare Other | Admitting: Family Medicine

## 2023-01-22 VITALS — BP 120/70 | HR 68 | Temp 97.5°F | Resp 16 | Ht 63.0 in | Wt 214.5 lb

## 2023-01-22 DIAGNOSIS — I10 Essential (primary) hypertension: Secondary | ICD-10-CM | POA: Diagnosis not present

## 2023-01-22 DIAGNOSIS — E559 Vitamin D deficiency, unspecified: Secondary | ICD-10-CM | POA: Diagnosis not present

## 2023-01-22 DIAGNOSIS — D696 Thrombocytopenia, unspecified: Secondary | ICD-10-CM

## 2023-01-22 DIAGNOSIS — E1169 Type 2 diabetes mellitus with other specified complication: Secondary | ICD-10-CM

## 2023-01-22 DIAGNOSIS — T466X5A Adverse effect of antihyperlipidemic and antiarteriosclerotic drugs, initial encounter: Secondary | ICD-10-CM

## 2023-01-22 DIAGNOSIS — R7989 Other specified abnormal findings of blood chemistry: Secondary | ICD-10-CM | POA: Diagnosis not present

## 2023-01-22 DIAGNOSIS — G720 Drug-induced myopathy: Secondary | ICD-10-CM | POA: Diagnosis not present

## 2023-01-22 DIAGNOSIS — Z961 Presence of intraocular lens: Secondary | ICD-10-CM | POA: Diagnosis not present

## 2023-01-22 DIAGNOSIS — N1831 Chronic kidney disease, stage 3a: Secondary | ICD-10-CM | POA: Diagnosis not present

## 2023-01-22 DIAGNOSIS — H43813 Vitreous degeneration, bilateral: Secondary | ICD-10-CM | POA: Diagnosis not present

## 2023-01-22 DIAGNOSIS — E782 Mixed hyperlipidemia: Secondary | ICD-10-CM

## 2023-01-22 LAB — POCT GLYCOSYLATED HEMOGLOBIN (HGB A1C): HbA1c, POC (controlled diabetic range): 6.5 % (ref 0.0–7.0)

## 2023-01-22 LAB — MICROALBUMIN / CREATININE URINE RATIO
Creatinine,U: 117.9 mg/dL
Microalb Creat Ratio: 0.6 mg/g (ref 0.0–30.0)
Microalb, Ur: <0.7 mg/dL (ref 0.0–1.9)

## 2023-01-22 LAB — BASIC METABOLIC PANEL
BUN: 19 mg/dL (ref 6–23)
CO2: 25 mEq/L (ref 19–32)
Calcium: 9.7 mg/dL (ref 8.4–10.5)
Chloride: 101 mEq/L (ref 96–112)
Creatinine, Ser: 1.03 mg/dL (ref 0.40–1.20)
GFR: 53.38 mL/min — ABNORMAL LOW (ref >60.00)
Glucose, Bld: 122 mg/dL — ABNORMAL HIGH (ref 70–99)
Potassium: 4 mEq/L (ref 3.5–5.1)
Sodium: 138 mEq/L (ref 135–145)

## 2023-01-22 LAB — HEPATIC FUNCTION PANEL
ALT: 21 U/L (ref 0–35)
AST: 40 U/L — ABNORMAL HIGH (ref 0–37)
Albumin: 4.1 g/dL (ref 3.5–5.2)
Alkaline Phosphatase: 59 U/L (ref 39–117)
Bilirubin, Direct: 0.4 mg/dL — ABNORMAL HIGH (ref 0.0–0.3)
Total Bilirubin: 1.6 mg/dL — ABNORMAL HIGH (ref 0.2–1.2)
Total Protein: 7.4 g/dL (ref 6.0–8.3)

## 2023-01-22 LAB — CBC
HCT: 45.4 % (ref 36.0–46.0)
Hemoglobin: 15.4 g/dL — ABNORMAL HIGH (ref 12.0–15.0)
MCHC: 33.9 g/dL (ref 30.0–36.0)
MCV: 94.2 fl (ref 78.0–100.0)
Platelets: 108 10*3/uL — ABNORMAL LOW (ref 150.0–400.0)
RBC: 4.82 Mil/uL (ref 3.87–5.11)
RDW: 13.4 % (ref 11.5–15.5)
WBC: 3.7 10*3/uL — ABNORMAL LOW (ref 4.0–10.5)

## 2023-01-22 LAB — LIPID PANEL
Cholesterol: 126 mg/dL (ref 0–200)
HDL: 53.9 mg/dL (ref >39.00)
LDL Cholesterol: 53 mg/dL (ref 0–99)
NonHDL: 71.99
Total CHOL/HDL Ratio: 2
Triglycerides: 95 mg/dL (ref 0.0–149.0)
VLDL: 19 mg/dL (ref 0.0–40.0)

## 2023-01-22 LAB — VITAMIN D 25 HYDROXY (VIT D DEFICIENCY, FRACTURES): VITD: 39.82 ng/mL (ref 30.00–100.00)

## 2023-01-22 LAB — HM DIABETES EYE EXAM

## 2023-01-22 NOTE — Patient Instructions (Addendum)
A few things to remember from today's visit:  Type 2 diabetes mellitus with other specified complication, without long-term current use of insulin (HCC) - Plan: POC HgB A1c, Microalbumin / creatinine urine ratio, Basic metabolic panel  Essential hypertension - Plan: Basic metabolic panel, CBC  Stage 3a chronic kidney disease (HCC) - Plan: Basic metabolic panel  Mixed hyperlipidemia - Plan: Hepatic function panel, Lipid panel  Do not use My Chart to request refills or for acute issues that need immediate attention. If you send a my chart message, it may take a few days to be addressed, specially if I am not in the office.  Please be sure medication list is accurate. If a new problem present, please set up appointment sooner than planned today.

## 2023-01-23 NOTE — Assessment & Plan Note (Signed)
Problem has been stable, e GFR 54-58. Continue adequate glucose and BP controlled and well as low-salt diet and avoidance of NSAIDs.

## 2023-01-23 NOTE — Assessment & Plan Note (Signed)
Continue vit D supplementation. Further recommendations according to 25 OH vit D result. 

## 2023-01-23 NOTE — Assessment & Plan Note (Signed)
BP adequately controlled. Continue lisinopril 10 mg daily, carvedilol 6.25 mg twice daily, and HCTZ 25 mg 1/2 tablet daily and well as low-salt/DASH diet.

## 2023-01-23 NOTE — Assessment & Plan Note (Signed)
Has not tolerated statins in the past and history of elevated transaminases. LDL 56 in 12/2021. Continue Praluent 75 mg every 2 weeks. Following with cardiologist.

## 2023-01-23 NOTE — Assessment & Plan Note (Signed)
Did not tolerate statins. She is currently on Praluent.

## 2023-01-23 NOTE — Assessment & Plan Note (Signed)
Evaluated by hematologist,09/10/22. Problem has been otherwise stable for years.

## 2023-01-23 NOTE — Assessment & Plan Note (Addendum)
HgA1C is now at goal. HgA1C went from 7.7 to 6.5. Continue non pharmacologic treatment.. Annual eye exam and foot care to continue. F/U in 5-6 months.

## 2023-01-24 ENCOUNTER — Encounter: Payer: Self-pay | Admitting: Family Medicine

## 2023-01-27 ENCOUNTER — Encounter: Payer: Self-pay | Admitting: Family Medicine

## 2023-02-01 ENCOUNTER — Ambulatory Visit
Admission: RE | Admit: 2023-02-01 | Discharge: 2023-02-01 | Disposition: A | Discharge status: Home / Self Care | Payer: Medicare Other | Source: Ambulatory Visit | Attending: Family Medicine | Admitting: Family Medicine

## 2023-02-01 DIAGNOSIS — Z9049 Acquired absence of other specified parts of digestive tract: Secondary | ICD-10-CM | POA: Diagnosis not present

## 2023-02-01 DIAGNOSIS — R7989 Other specified abnormal findings of blood chemistry: Secondary | ICD-10-CM

## 2023-02-01 DIAGNOSIS — R748 Abnormal levels of other serum enzymes: Secondary | ICD-10-CM | POA: Diagnosis not present

## 2023-02-08 ENCOUNTER — Ambulatory Visit
Admission: RE | Admit: 2023-02-08 | Discharge: 2023-02-08 | Disposition: A | Discharge status: Home / Self Care | Payer: Medicare Other | Source: Ambulatory Visit | Attending: Family Medicine | Admitting: Family Medicine

## 2023-02-08 DIAGNOSIS — Z1231 Encounter for screening mammogram for malignant neoplasm of breast: Secondary | ICD-10-CM

## 2023-02-11 ENCOUNTER — Telehealth: Payer: Self-pay | Admitting: Cardiology

## 2023-02-11 NOTE — Telephone Encounter (Signed)
New Message:      Please call Melissa, concerning patient's Repatha diagnosis.

## 2023-02-11 NOTE — Telephone Encounter (Signed)
Melissa of [facility] called back, per message received in [facility] Triage.    Call received / message stated it was regarding Pt Repatha -  Diagnosis.  Follow up to investigate required.

## 2023-02-12 NOTE — Telephone Encounter (Signed)
Miranda Wong, RPH-CPP      12/24/22  8:31 AM Note Able to re-enroll pt in [facility] grant, info sent to pt in mychart message and left her a detailed message. Also added grant info to rx refill I sent in.

## 2023-03-19 ENCOUNTER — Other Ambulatory Visit: Payer: Self-pay | Admitting: Cardiovascular Disease

## 2023-03-19 DIAGNOSIS — I2510 Atherosclerotic heart disease of native coronary artery without angina pectoris: Secondary | ICD-10-CM

## 2023-05-04 ENCOUNTER — Other Ambulatory Visit: Payer: Self-pay | Admitting: Cardiovascular Disease

## 2023-05-04 DIAGNOSIS — I2510 Atherosclerotic heart disease of native coronary artery without angina pectoris: Secondary | ICD-10-CM

## 2023-06-30 DIAGNOSIS — Z23 Encounter for immunization: Secondary | ICD-10-CM | POA: Diagnosis not present

## 2023-07-09 ENCOUNTER — Encounter: Payer: Self-pay | Admitting: Internal Medicine

## 2023-07-27 ENCOUNTER — Ambulatory Visit: Payer: Medicare Other | Admitting: Family Medicine

## 2023-07-27 ENCOUNTER — Encounter: Payer: Self-pay | Admitting: Family Medicine

## 2023-07-27 VITALS — BP 110/70 | HR 73 | Resp 16 | Ht 63.0 in | Wt 215.5 lb

## 2023-07-27 DIAGNOSIS — I2510 Atherosclerotic heart disease of native coronary artery without angina pectoris: Secondary | ICD-10-CM | POA: Diagnosis not present

## 2023-07-27 DIAGNOSIS — N1831 Chronic kidney disease, stage 3a: Secondary | ICD-10-CM | POA: Diagnosis not present

## 2023-07-27 DIAGNOSIS — R7989 Other specified abnormal findings of blood chemistry: Secondary | ICD-10-CM

## 2023-07-27 DIAGNOSIS — I10 Essential (primary) hypertension: Secondary | ICD-10-CM

## 2023-07-27 DIAGNOSIS — E1169 Type 2 diabetes mellitus with other specified complication: Secondary | ICD-10-CM | POA: Diagnosis not present

## 2023-07-27 DIAGNOSIS — D696 Thrombocytopenia, unspecified: Secondary | ICD-10-CM | POA: Diagnosis not present

## 2023-07-27 DIAGNOSIS — E782 Mixed hyperlipidemia: Secondary | ICD-10-CM | POA: Diagnosis not present

## 2023-07-27 LAB — HEPATIC FUNCTION PANEL
ALT: 21 U/L (ref 0–35)
AST: 36 U/L (ref 0–37)
Albumin: 4.2 g/dL (ref 3.5–5.2)
Alkaline Phosphatase: 57 U/L (ref 39–117)
Bilirubin, Direct: 0.4 mg/dL — ABNORMAL HIGH (ref 0.0–0.3)
Total Bilirubin: 1.6 mg/dL — ABNORMAL HIGH (ref 0.2–1.2)
Total Protein: 7.4 g/dL (ref 6.0–8.3)

## 2023-07-27 LAB — CBC
HCT: 45.4 % (ref 36.0–46.0)
Hemoglobin: 16 g/dL — ABNORMAL HIGH (ref 12.0–15.0)
MCHC: 35.2 g/dL (ref 30.0–36.0)
MCV: 94.9 fL (ref 78.0–100.0)
Platelets: 104 10*3/uL — ABNORMAL LOW (ref 150.0–400.0)
RBC: 4.79 Mil/uL (ref 3.87–5.11)
RDW: 12.9 % (ref 11.5–15.5)
WBC: 4 10*3/uL (ref 4.0–10.5)

## 2023-07-27 LAB — BASIC METABOLIC PANEL
BUN: 27 mg/dL — ABNORMAL HIGH (ref 6–23)
CO2: 25 meq/L (ref 19–32)
Calcium: 9.4 mg/dL (ref 8.4–10.5)
Chloride: 102 meq/L (ref 96–112)
Creatinine, Ser: 1.12 mg/dL (ref 0.40–1.20)
GFR: 48.1 mL/min — ABNORMAL LOW (ref >60.00)
Glucose, Bld: 108 mg/dL — ABNORMAL HIGH (ref 70–99)
Potassium: 3.7 meq/L (ref 3.5–5.1)
Sodium: 137 meq/L (ref 135–145)

## 2023-07-27 LAB — HEMOGLOBIN A1C: Hgb A1c MFr Bld: 6.7 % — ABNORMAL HIGH (ref 4.6–6.5)

## 2023-07-27 MED ORDER — PRALUENT 75 MG/ML ~~LOC~~ SOAJ: 1.0000 mL | SUBCUTANEOUS | 3 refills | Status: DC

## 2023-07-27 NOTE — Progress Notes (Signed)
HPI: Ms.Shanai T Guelker is a 77 y.o. female with a PMHx significant for CAD, HTN, NSTEMI, DM II, statin myopathy, CKD III, thrombocytopenia, HLD, and vitamin D deficiency, who is here today for chronic disease management.  Last seen on 01/22/2023  Hypertension:  Medications: Currently on hydrochlorothiazide 12.5 mg daily, carvedilol 6.25 mg bid, and lisinopril 10 mg bid.   BP readings at home: Not checking at home. Side effects: none  Negative for unusual or severe headache, visual changes, exertional chest pain, dyspnea,  focal weakness, or edema. CKD III: She has not noted gross hematuria, foam in urine, or decreased urine output. Lab Results  Component Value Date   CREATININE 1.03 01/22/2023   BUN 19 01/22/2023   NA 138 01/22/2023   K 4.0 01/22/2023   CL 101 01/22/2023   CO2 25 01/22/2023   Diabetes Mellitus II: Diagnosed in 07/2018.  - Checking BG at home: She is not currently checking her BG at home.  - Medications: Not currently on pharmacologic treatment for diabetes.  - Exercise: She hasn't changed her exercise habits since her last visit.  - Diet: She has tried to change some minor aspects of her diet but has made no major changes.  - eye exam: UTD. Her last appointment was 5/24.  - foot exam: Performed today.  - Negative for symptoms of hypoglycemia, polyuria, polydipsia, numbness extremities, foot ulcers/trauma  Lab Results  Component Value Date   HGBA1C 6.5 01/22/2023   Lab Results  Component Value Date   MICROALBUR <0.7 01/22/2023   Hyperlipidemia: She has not tolerated oral medications. Currently on Praluent 75 mg injection every 2 weeks, she needs refills. Follows with cardiologist.  Side effects from medication: none Lab Results  Component Value Date   CHOL 126 01/22/2023   HDL 53.90 01/22/2023   LDLCALC 53 01/22/2023   TRIG 95.0 01/22/2023   CHOLHDL 2 01/22/2023   Elevated transaminases: She denies recent abdominal pain, jaundice, or nausea.   She mentions she was told she had a spot on her liver after her cholecystectomy in 1984. Liver US on 02/01/23:The gallbladder is surgically absent. The study is otherwise normal      Latest Ref Rng & Units 01/22/2023   10:16 AM 01/21/2022    8:42 AM 04/04/2021   10:57 AM  Hepatic Function  Total Protein 6.0 - 8.3 g/dL 7.4  7.2  7.4   Albumin 3.5 - 5.2 g/dL 4.1  4.0  3.9   AST 0 - 37 U/L 40  64  66   ALT 0 - 35 U/L 21  41  40   Alk Phosphatase 39 - 117 U/L 59  76  108   Total Bilirubin 0.2 - 1.2 mg/dL 1.6  1.2  1.4   Bilirubin, Direct 0.0 - 0.3 mg/dL 0.4  0.3  0.3    Thrombocytopenia:  She saw hematology last year and has no follow up scheduled. According to pt, she was reassured. Negative for gum/nosebleed, melena,or blood in stool.     Latest Ref Rng & Units 01/22/2023   10:16 AM 09/10/2022    4:08 PM 07/21/2022   10:01 AM  CBC  WBC 4.0 - 10.5 K/uL 3.7  4.9  3.7   Hemoglobin 12.0 - 15.0 g/dL 54.0  98.1  19.1   Hematocrit 36.0 - 46.0 % 45.4  43.2  43.5   Platelets 150.0 - 400.0 K/uL 108.0  126  111.0    Review of Systems  Constitutional:  Negative for activity  change, appetite change, chills and fever.  HENT:  Negative for mouth sores, sore throat and trouble swallowing.   Respiratory:  Negative for cough and wheezing.   Gastrointestinal:  Negative for abdominal distention and vomiting.  Genitourinary:  Negative for difficulty urinating and dysuria.  Musculoskeletal:  Negative for gait problem and myalgias.  Skin:  Negative for rash.  Neurological:  Negative for syncope and facial asymmetry.  Psychiatric/Behavioral:  Negative for confusion. The patient is not nervous/anxious.   See other pertinent positives and negatives in HPI.  Current Outpatient Medications on File Prior to Visit  Medication Sig Dispense Refill   Alirocumab (PRALUENT) 75 MG/ML SOAJ Inject 1 mL (75 mg total) into the skin every 14 (fourteen) days. 6 mL 3   aspirin EC 81 MG EC tablet Take 1 tablet (81 mg  total) by mouth daily.     b complex vitamins tablet Take 1 tablet by mouth every other day.     carvedilol (COREG) 6.25 MG tablet TAKE 1 TABLET BY MOUTH 2 TIMES DAILY WITH A MEAL. 180 tablet 3   fluticasone (FLONASE) 50 MCG/ACT nasal spray Place 2 sprays into both nostrils as needed for allergies or rhinitis.     hydrochlorothiazide (HYDRODIURIL) 25 MG tablet TAKE 1/2 TABLET BY MOUTH EVERY DAY 45 tablet 3   hydroxypropyl methylcellulose / hypromellose (ISOPTO TEARS / GONIOVISC) 2.5 % ophthalmic solution Place 1 drop into both eyes as needed for dry eyes.     lisinopril (ZESTRIL) 10 MG tablet TAKE 1 TABLET BY MOUTH EVERY MORNING AND AT BEDTIME 180 tablet 3   nitroGLYCERIN (NITROSTAT) 0.4 MG SL tablet Place 1 tablet (0.4 mg total) under the tongue every 5 (five) minutes x 3 doses as needed for chest pain. 25 tablet 3   VITAMIN D PO Take 2 tablets by mouth daily.     No current facility-administered medications on file prior to visit.    Past Medical History:  Diagnosis Date   Allergy    CAD (coronary artery disease)    cath 06/02/2015 25% mid RCA, 25% prox LCx, 100% prox LAD treated with DES (3.0 x 15 mm Xience), EF 40%   Chicken pox    Former smoker    Heart disease    Hyperlipidemia    Hypertension    Mild ichemic cardiomyopathy, EF 45-50% post cath 06/2015 06/04/2015   NSTEMI (non-ST elevated myocardial infarction) (HCC) 06/02/2015   Type 2 diabetes mellitus without complication (HCC) 06/22/2019   Allergies  Allergen Reactions   Atorvastatin Other (See Comments)    myalgia    Social History   Socioeconomic History   Marital status: Widowed    Spouse name: Not on file   Number of children: Not on file   Years of education: Not on file   Highest education level: Not on file  Occupational History   Not on file  Tobacco Use   Smoking status: Former   Smokeless tobacco: Never   Tobacco comments:    quit when she was 30s  Vaping Use   Vaping status: Never Used  Substance  and Sexual Activity   Alcohol use: No    Comment: ocassionally   Drug use: No   Sexual activity: Yes    Birth control/protection: None  Other Topics Concern   Not on file  Social History Narrative   Not on file   Social Determinants of Health   Financial Resource Strain: Low Risk  (07/23/2023)   Overall Financial Resource Strain (CARDIA)  Difficulty of Paying Living Expenses: Not very hard  Food Insecurity: No Food Insecurity (07/23/2023)   Hunger Vital Sign    Worried About Running Out of Food in the Last Year: Never true    Ran Out of Food in the Last Year: Never true  Transportation Needs: No Transportation Needs (07/23/2023)   PRAPARE - Administrator, Civil Service (Medical): No    Lack of Transportation (Non-Medical): No  Physical Activity: Sufficiently Active (12/01/2021)   Exercise Vital Sign    Days of Exercise per Week: 5 days    Minutes of Exercise per Session: 30 min  Stress: No Stress Concern Present (07/23/2023)   [employer] of Occupational Health - Occupational Stress Questionnaire    Feeling of Stress : Not at all  Social Connections: Moderately Isolated (07/23/2023)   Social Connection and Isolation Panel [NHANES]    Frequency of Communication with Friends and Family: Three times a week    Frequency of Social Gatherings with Friends and Family: Twice a week    Attends Religious Services: 1 to 4 times per year    Active Member of [employer] or Organizations: No    Attends Banker Meetings: Not on file    Marital Status: Widowed   Today's Vitals   07/27/23 0914  BP: 110/70  Pulse: 73  Resp: 16  SpO2: 94%  Weight: 215 lb 8 oz (97.8 kg)  Height: 5\' 3"  (1.6 m)   Wt Readings from Last 3 Encounters:  07/27/23 215 lb 8 oz (97.8 kg)  01/22/23 214 lb 8 oz (97.3 kg)  12/03/22 215 lb 3.2 oz (97.6 kg)   Body mass index is 38.17 kg/m.  Physical Exam Vitals and nursing note reviewed.  Constitutional:      General: She is not in  acute distress.    Appearance: She is well-developed.  HENT:     Head: Normocephalic and atraumatic.     Mouth/Throat:     Mouth: Mucous membranes are moist.     Dentition: Has dentures.     Pharynx: Oropharynx is clear. Uvula midline.  Eyes:     Conjunctiva/sclera: Conjunctivae normal.  Cardiovascular:     Rate and Rhythm: Normal rate and regular rhythm.     Pulses:          Dorsalis pedis pulses are 2+ on the right side and 2+ on the left side.     Heart sounds: No murmur heard. Pulmonary:     Effort: Pulmonary effort is normal. No respiratory distress.     Breath sounds: Normal breath sounds.  Abdominal:     Palpations: Abdomen is soft. There is no hepatomegaly or mass.     Tenderness: There is no abdominal tenderness.  Musculoskeletal:     Right lower leg: No edema.     Left lower leg: No edema.  Lymphadenopathy:     Cervical: No cervical adenopathy.  Skin:    General: Skin is warm.     Findings: No erythema or rash.  Neurological:     General: No focal deficit present.     Mental Status: She is alert and oriented to person, place, and time.     Cranial Nerves: No cranial nerve deficit.     Gait: Gait normal.  Psychiatric:        Mood and Affect: Mood and affect normal.   ASSESSMENT AND PLAN:  Ms. Southers was seen today for chronic disease management.   Orders Placed This Encounter  Procedures   CBC   Basic metabolic panel   Hepatic function panel   Hemoglobin A1c   Lab Results  Component Value Date   HGBA1C 6.7 (H) 07/27/2023   Lab Results  Component Value Date   ALT 21 07/27/2023   AST 36 07/27/2023   ALKPHOS 57 07/27/2023   BILITOT 1.6 (H) 07/27/2023   Lab Results  Component Value Date   NA 137 07/27/2023   CL 102 07/27/2023   K 3.7 07/27/2023   CO2 25 07/27/2023   BUN 27 (H) 07/27/2023   CREATININE 1.12 07/27/2023   GFR 48.10 (L) 07/27/2023   CALCIUM 9.4 07/27/2023   ALBUMIN 4.2 07/27/2023   GLUCOSE 108 (H) 07/27/2023   Lab Results   Component Value Date   WBC 4.0 07/27/2023   HGB 16.0 (H) 07/27/2023   HCT 45.4 07/27/2023   MCV 94.9 07/27/2023   PLT 104.0 (L) 07/27/2023   Type 2 diabetes mellitus with other specified complication, without long-term current use of insulin (HCC) HgA1C has been at goal, last one 6.5. Continue non pharmacologic treatment.Further recommendations according to HgA1C result. Annual eye exam and foot care to continue. F/U in 5-6 months. -     Hemoglobin A1c; Future  Coronary artery disease involving native coronary artery of native heart without angina pectoris LDL 53 in 12/2022. Following with cardiologist.  -     Praluent; Inject 1 mL (75 mg total) into the skin every 14 (fourteen) days.  Dispense: 6 mL; Refill: 3  Thrombocytopenia (HCC) Otherwise stable. She has seen hematologist.  -     CBC; Future  Stage 3a chronic kidney disease (HCC) Problem has been stable, e GFR 50's Stressed the importance of adequate glucose and BP control. Low-salt diet, adequate hydration,an avoidance of NSAIDs to continue.  -     Basic metabolic panel; Future  Essential hypertension BP is adequately controlled. Continue lisinopril 10 mg daily, carvedilol 6.25 mg twice daily, and HCTZ 25 mg 1/2 tablet daily. Low-salt/DASH diet. Monitor BP regularly.  -     Basic metabolic panel; Future  Abnormal LFTs Stable otherwise. Continue a healthful diet, low fat,and alcohol avoidance. Will continue following regularly.  -     Hepatic function panel; Future  Mixed hyperlipidemia Has not tolerated statins or other oral agent. She is doing well with Praluent and LDL is at goal.  -     Praluent; Inject 1 mL (75 mg total) into the skin every 14 (fourteen) days.  Dispense: 6 mL; Refill: 3  Return in about 6 months (around 01/24/2024) for chronic problems.  I, Suanne Marker, acting as a scribe for Joas Motton [place], MD., have documented all relevant documentation on the behalf of Chananya Canizalez [place], MD, as  directed by  Fatisha Rabalais [place], MD while in the presence of Yahel Fuston [place], MD.   I, Suanne Marker, have reviewed all documentation for this visit. The documentation on 07/27/23 for the exam, diagnosis, procedures, and orders are all accurate and complete.  Dalanie Kisner G. [place], MD  [facility]. [place] office.

## 2023-07-27 NOTE — Patient Instructions (Addendum)
A few things to remember from today's visit:  Type 2 diabetes mellitus with other specified complication, without long-term current use of insulin (HCC) - Plan: Hemoglobin A1c  Mixed hyperlipidemia - Plan: Alirocumab (PRALUENT) 75 MG/ML SOAJ  Coronary artery disease involving native coronary artery of native heart without angina pectoris - Plan: Alirocumab (PRALUENT) 75 MG/ML SOAJ  Thrombocytopenia (HCC) - Plan: CBC  Stage 3a chronic kidney disease (HCC) - Plan: Basic metabolic panel  Essential hypertension - Plan: Basic metabolic panel  Abnormal LFTs - Plan: Hepatic function panel  No changes today.  If you need refills for medications you take chronically, please call your pharmacy. Do not use My Chart to request refills or for acute issues that need immediate attention. If you send a my chart message, it may take a few days to be addressed, specially if I am not in the office.  Please be sure medication list is accurate. If a new problem present, please set up appointment sooner than planned today.

## 2023-07-31 ENCOUNTER — Encounter: Payer: Self-pay | Admitting: Family Medicine

## 2023-09-06 ENCOUNTER — Encounter: Payer: Self-pay | Admitting: Internal Medicine

## 2023-09-10 NOTE — Progress Notes (Signed)
 Cardiology Office Note:    Date:  09/13/2023  ID:  Miranda Wong, DOB 10-Jul-1946, MRN [id number] PCP: Jordan, Betty G, MD  [facility] Providers Cardiologist:  Ozell Fell, MD       Patient Profile:      Coronary artery disease  S/p anterior NSTEMI in 10/16 >> PCI: 3 x 15 mm DES to LAD [facility] 06/02/15: pLAD 100 (PCI), pLCx 25, mRCA 25, EF 35-45 Ischemic CM  EF 45-50 post MI in 2016 TTE 10/10/15: Vigorous LVF, EF 65-70%, normal wall motion, grade 1 diastolic dysfunction, normal RVSF, mild TR, PASP 38 mmHg  Hypertension  Hyperlipidemia  Intol of Atorva (myalgias); Rosuva (cost)         History of Present Illness:  Discussed the use of AI scribe software for clinical note transcription with the patient, who gave verbal consent to proceed.  Miranda Wong is a 78 y.o. female who returns for follow up of CAD. She was last seen in 08/2022 by Delon Hoover, NP. She is here alone. She reports no new health issues since the last visit. The patient denies experiencing any chest pain, shortness of breath, dizziness, or leg swelling. She does mention occasional zings or blips that she describes as minor and transient, which she believes are related to severe allergies and sneezing. The patient is not engaged in any regular exercise routine but maintains an active lifestyle, including yard work and grocery shopping, without any discomfort. The patient has a history of smoking but quit approximately 45 years ago.     ROS-See HPI     Studies Reviewed:   EKG Interpretation Date/Time:  Monday September 13 2023 10:21:46 EST Ventricular Rate:  60 PR Interval:  204 QRS Duration:  108 QT Interval:  434 QTC Calculation: 434 R Axis:   -35  Text Interpretation: Normal sinus rhythm Left axis deviation Septal infarct (cited on or before 02-Jun-2015) No significant change since last tracing Confirmed by Lelon Hamilton [phone]) on 09/13/2023 10:47:21 AM     Results  LABS - Chart  Review Total cholesterol: 126 (01/22/2023) HDL: 53.9 (01/22/2023) LDL: 53 (01/22/2023) Triglycerides: 95 (01/22/2023) Hemoglobin: 16 (07/27/2023) Creatinine: 1.12 (07/27/2023) Potassium: 3.7 (07/27/2023) ALT: 21 (07/27/2023)      Risk Assessment/Calculations:             Physical Exam:   VS:  BP 138/78 (BP Location: Right Arm, Cuff Size: Large)   Ht 5' 3 (1.6 m)   Wt 222 lb (100.7 kg)   SpO2 96%   BMI 39.33 kg/m    Wt Readings from Last 3 Encounters:  09/13/23 222 lb (100.7 kg)  07/27/23 215 lb 8 oz (97.8 kg)  01/22/23 214 lb 8 oz (97.3 kg)    Constitutional:      Appearance: Healthy appearance. Not in distress.  Neck:     Vascular: No carotid bruit. JVD normal.  Pulmonary:     Breath sounds: Normal breath sounds. No wheezing. No rales.  Cardiovascular:     Normal rate. Regular rhythm.     Murmurs: There is no murmur.  Edema:    Peripheral edema absent.  Abdominal:     Palpations: Abdomen is soft.        Assessment and Plan:   Assessment & Plan Coronary artery disease involving native coronary artery of native heart without angina pectoris History of non-ST elevation myocardial infarction October 2016 treated with drug-eluting stent to the LAD.  Her EF was mildly depressed at the time of  her myocardial infarction.  This improved to normal by follow-up echocardiogram in 2017.  She is doing well without chest discomfort to suggest angina. -Continue ASA 81 mg daily, Praluent  75 mg every 2 weeks, carvedilol  6.25 mg twice daily -Follow-up 1 year  Ischemic cardiomyopathy EF was 35-45 at the time of her myocardial infarction.  Echo in February 2017 demonstrated improved LV function with EF 65-70.  She is in weight today 2.  Volume status stable. -Continue lisinopril  10 mg twice daily, carvedilol  6.25 mg twice daily  Pure hypercholesterolemia LDL in 12/2022 was optimal at 53.  -Continue Praluent  75 mg every 2 weeks  Essential hypertension Blood pressure borderline.   She did have a salty meal yesterday.  Her blood pressure is usually well-controlled.  Continue to monitor and notify us  of blood pressure readings are above goal. -Continue Coreg  6.25 mg twice daily, hydrochlorothiazide  12.5 mg once daily, Lisinopril  10 mg twice daily          Dispo:  Return in about 1 year (around 09/12/2024) for Routine Follow Up, w/ Dr. Wonda, or Glendia Ferrier, PA-C.  Signed, Glendia Ferrier, PA-C

## 2023-09-13 ENCOUNTER — Encounter: Payer: Self-pay | Admitting: Physician Assistant

## 2023-09-13 ENCOUNTER — Ambulatory Visit: Payer: Medicare Other | Attending: Physician Assistant | Admitting: Physician Assistant

## 2023-09-13 VITALS — BP 138/78 | Ht 63.0 in | Wt 222.0 lb

## 2023-09-13 DIAGNOSIS — I2510 Atherosclerotic heart disease of native coronary artery without angina pectoris: Secondary | ICD-10-CM | POA: Insufficient documentation

## 2023-09-13 DIAGNOSIS — E7800 Pure hypercholesterolemia, unspecified: Secondary | ICD-10-CM | POA: Diagnosis not present

## 2023-09-13 DIAGNOSIS — I255 Ischemic cardiomyopathy: Secondary | ICD-10-CM | POA: Insufficient documentation

## 2023-09-13 DIAGNOSIS — I10 Essential (primary) hypertension: Secondary | ICD-10-CM | POA: Diagnosis not present

## 2023-09-13 NOTE — Assessment & Plan Note (Signed)
 Blood pressure borderline.  She did have a salty meal yesterday.  Her blood pressure is usually well-controlled.  Continue to monitor and notify us  of blood pressure readings are above goal. -Continue Coreg  6.25 mg twice daily, hydrochlorothiazide  12.5 mg once daily, Lisinopril  10 mg twice daily

## 2023-09-13 NOTE — Patient Instructions (Signed)
 Medication Instructions:  Your physician recommends that you continue on your current medications as directed. Please refer to the Current Medication list given to you today.   *If you need a refill on your cardiac medications before your next appointment, please call your pharmacy*   Lab Work: None ordered  If you have labs (blood work) drawn today and your tests are completely normal, you will receive your results only by: MyChart Message (if you have MyChart) OR A paper copy in the mail If you have any lab test that is abnormal or we need to change your treatment, we will call you to review the results.   Testing/Procedures: None ordered   Follow-Up: At [facility], you and your health needs are our priority.  As part of our continuing mission to provide you with exceptional heart care, we have created designated Provider Care Teams.  These Care Teams include your primary Cardiologist (physician) and Advanced Practice Providers (APPs -  Physician Assistants and Nurse Practitioners) who all work together to provide you with the care you need, when you need it.  We recommend signing up for the patient portal called MyChart.  Sign up information is provided on this After Visit Summary.  MyChart is used to connect with patients for Virtual Visits (Telemedicine).  Patients are able to view lab/test results, encounter notes, upcoming appointments, etc.  Non-urgent messages can be sent to your provider as well.   To learn more about what you can do with MyChart, go to [email or web address].    Your next appointment:   12 month(s)  Provider:   Ozell Fell, MD  or Glendia Ferrier, PA-C         Other Instructions Monitor your blood pressure as you are able.  Call us  if your bp is 140/90 or higher 3 times in a row

## 2023-09-13 NOTE — Assessment & Plan Note (Signed)
 LDL in 12/2022 was optimal at 53.  -Continue Praluent 75 mg every 2 weeks

## 2023-09-13 NOTE — Assessment & Plan Note (Signed)
 History of non-ST elevation myocardial infarction October 2016 treated with drug-eluting stent to the LAD.  Her EF was mildly depressed at the time of her myocardial infarction.  This improved to normal by follow-up echocardiogram in 2017.  She is doing well without chest discomfort to suggest angina. -Continue ASA 81 mg daily, Praluent  75 mg every 2 weeks, carvedilol  6.25 mg twice daily -Follow-up 1 year

## 2023-09-13 NOTE — Assessment & Plan Note (Signed)
 EF was 35-45 at the time of her myocardial infarction.  Echo in February 2017 demonstrated improved LV function with EF 65-70.  She is in weight today 2.  Volume status stable. -Continue lisinopril 10 mg twice daily, carvedilol 6.25 mg twice daily

## 2023-10-04 ENCOUNTER — Ambulatory Visit (AMBULATORY_SURGERY_CENTER): Payer: Medicare Other | Admitting: *Deleted

## 2023-10-04 VITALS — Ht 63.0 in | Wt 210.0 lb

## 2023-10-04 DIAGNOSIS — Z860100 Personal history of colon polyps, unspecified: Secondary | ICD-10-CM

## 2023-10-04 MED ORDER — SUFLAVE 178.7 G PO SOLR
1.0000 | Freq: Once | ORAL | 0 refills | Status: AC
Start: 2023-10-04 — End: 2023-10-04

## 2023-10-04 NOTE — Progress Notes (Signed)
Pt's name and DOB verified at the beginning of the pre-visit wit 2 identifiers  Pt denies any difficulty with ambulating,sitting, laying down or rolling side to side  Pt has no issues with ambulation   Pt has no issues moving head neck or swallowing  No egg or soy allergy known to patient   No issues known to pt with past sedation with any surgeries or procedures  Pt denies having issues being intubated  No FH of Malignant Hyperthermia  Pt is not on diet pills or shots  Pt is not on home 02   Pt is not on blood thinners   Pt denies issues with constipation   Pt is not on dialysis  Pt denise any abnormal heart rhythms   Pt denies any upcoming cardiac testing  Pt encouraged to use to use Singlecare or Goodrx to reduce cost   Patient's chart reviewed by Cathlyn Parsons CNRA prior to pre-visit and patient appropriate for the [facility].  Pre-visit completed and red dot placed by patient's name on their procedure day (on provider's schedule).  .  Visit by phone  Pt states weight is 210 lb  Instructed pt why it is important to and  to call if they have any changes in health or new medications. Directed them to the # given and on instructions.     Instructions reviewed. Pt given both [facility] main # and MD on call #  and  [facility]'s#prior to instructions.  Pt states understanding. Instructed to review again prior to procedure. Pt states they will.   Informed pt that they will receive a text and call from [facility] regarding there prep med.

## 2023-10-14 ENCOUNTER — Ambulatory Visit: Payer: Medicare Other | Admitting: Internal Medicine

## 2023-10-14 ENCOUNTER — Encounter: Payer: Self-pay | Admitting: Internal Medicine

## 2023-10-14 VITALS — BP 157/76 | HR 69 | Temp 98.4°F | Resp 11 | Ht 63.0 in | Wt 210.0 lb

## 2023-10-14 DIAGNOSIS — I10 Essential (primary) hypertension: Secondary | ICD-10-CM | POA: Diagnosis not present

## 2023-10-14 DIAGNOSIS — D124 Benign neoplasm of descending colon: Secondary | ICD-10-CM | POA: Diagnosis not present

## 2023-10-14 DIAGNOSIS — Z1211 Encounter for screening for malignant neoplasm of colon: Secondary | ICD-10-CM

## 2023-10-14 DIAGNOSIS — K648 Other hemorrhoids: Secondary | ICD-10-CM | POA: Diagnosis not present

## 2023-10-14 DIAGNOSIS — E119 Type 2 diabetes mellitus without complications: Secondary | ICD-10-CM | POA: Diagnosis not present

## 2023-10-14 DIAGNOSIS — K635 Polyp of colon: Secondary | ICD-10-CM | POA: Diagnosis not present

## 2023-10-14 DIAGNOSIS — E785 Hyperlipidemia, unspecified: Secondary | ICD-10-CM | POA: Diagnosis not present

## 2023-10-14 DIAGNOSIS — Z860100 Personal history of colon polyps, unspecified: Secondary | ICD-10-CM | POA: Diagnosis not present

## 2023-10-14 DIAGNOSIS — D120 Benign neoplasm of cecum: Secondary | ICD-10-CM

## 2023-10-14 DIAGNOSIS — I2510 Atherosclerotic heart disease of native coronary artery without angina pectoris: Secondary | ICD-10-CM | POA: Diagnosis not present

## 2023-10-14 DIAGNOSIS — R195 Other fecal abnormalities: Secondary | ICD-10-CM

## 2023-10-14 DIAGNOSIS — D125 Benign neoplasm of sigmoid colon: Secondary | ICD-10-CM | POA: Diagnosis not present

## 2023-10-14 DIAGNOSIS — D123 Benign neoplasm of transverse colon: Secondary | ICD-10-CM

## 2023-10-14 MED ORDER — SODIUM CHLORIDE 0.9 % IV SOLN: 500.0000 mL | Freq: Once | INTRAVENOUS | Status: DC

## 2023-10-14 NOTE — Progress Notes (Signed)
Report to PACU, RN, vss, BBS= Clear.

## 2023-10-14 NOTE — Op Note (Signed)
[facility] Patient Name: Miranda Wong Procedure Date: 10/14/2023 2:00 PM MRN: [id number] Endoscopist: Madelyn Brunner Metter , , [id number] Age: 78 Referring MD:  Date of Birth: 1945-12-19 Gender: Female Account #: [id number] Procedure:                Colonoscopy Indications:              High risk colon cancer surveillance: Personal                            history of colonic polyps Medicines:                Monitored Anesthesia Care Procedure:                Pre-Anesthesia Assessment:                           - Prior to the procedure, a History and Physical                            was performed, and patient medications and                            allergies were reviewed. The patient's tolerance of                            previous anesthesia was also reviewed. The risks                            and benefits of the procedure and the sedation                            options and risks were discussed with the patient.                            All questions were answered, and informed consent                            was obtained. Prior Anticoagulants: The patient has                            taken no anticoagulant or antiplatelet agents. ASA                            Grade Assessment: III - A patient with severe                            systemic disease. After reviewing the risks and                            benefits, the patient was deemed in satisfactory                            condition to undergo the procedure.  After obtaining informed consent, the colonoscope                            was passed under direct vision. Throughout the                            procedure, the patient's blood pressure, pulse, and                            oxygen saturations were monitored continuously. The                            Olympus Scope [id number] was introduced through the                            anus and advanced to the the  terminal ileum. The                            colonoscopy was performed without difficulty. The                            patient tolerated the procedure well. The quality                            of the bowel preparation was good. The terminal                            ileum, ileocecal valve, appendiceal orifice, and                            rectum were photographed. Scope In: 2:08:25 PM Scope Out: 2:34:39 PM Scope Withdrawal Time: 0 hours 19 minutes 45 seconds  Total Procedure Duration: 0 hours 26 minutes 14 seconds  Findings:                 The terminal ileum appeared normal.                           Four sessile polyps were found in the transverse                            colon and cecum. The polyps were 3 to 6 mm in size.                            These polyps were removed with a cold snare.                            Resection and retrieval were complete.                           Two sessile polyps were found in the sigmoid colon                            and descending colon. The polyps  were 3 to 5 mm in                            size. These polyps were removed with a cold snare.                            Resection and retrieval were complete.                           Non-bleeding internal hemorrhoids were found during                            retroflexion. Complications:            No immediate complications. Estimated Blood Loss:     Estimated blood loss was minimal. Impression:               - The examined portion of the ileum was normal.                           - Four 3 to 6 mm polyps in the transverse colon and                            in the cecum, removed with a cold snare. Resected                            and retrieved.                           - Two 3 to 5 mm polyps in the sigmoid colon and in                            the descending colon, removed with a cold snare.                            Resected and retrieved.                            - Non-bleeding internal hemorrhoids. Recommendation:           - Discharge patient to home (with escort).                           - Await pathology results.                           - The findings and recommendations were discussed                            with the patient. Dr Particia Lather "Alan Ripper" [place],  10/14/2023 2:40:48 PM

## 2023-10-14 NOTE — Progress Notes (Signed)
GASTROENTEROLOGY PROCEDURE H&P NOTE   Primary Care Physician: [place], Betty G, MD    Reason for Procedure:   History of colon polyps  Plan:    Colonoscopy  Patient is appropriate for endoscopic procedure(s) in the ambulatory ([facility]) setting.  The nature of the procedure, as well as the risks, benefits, and alternatives were carefully and thoroughly reviewed with the patient. Ample time for discussion and questions allowed. The patient understood, was satisfied, and agreed to proceed.     HPI: Miranda Wong is a 78 y.o. female who presents for colonoscopy for history of colon polyps. Denies changes in bowel habits or unintentional weight loss. Sometimes she will see blood when she wipes. Denies family history of colon cancer.  Past Medical History:  Diagnosis Date   Allergy    CAD (coronary artery disease)    cath 06/02/2015 25% mid RCA, 25% prox LCx, 100% prox LAD treated with DES (3.0 x 15 mm Xience), EF 40%   Cataract    Chicken pox    Former smoker    Heart disease    Hyperlipidemia    Hypertension    Measles    Mild ichemic cardiomyopathy, EF 45-50% post cath 06/2015 06/04/2015   NSTEMI (non-ST elevated myocardial infarction) (HCC) 06/02/2015   Type 2 diabetes mellitus without complication (HCC) 06/22/2019    Past Surgical History:  Procedure Laterality Date   CARDIAC CATHETERIZATION N/A 06/02/2015   Procedure: Left Heart Cath and Coronary Angiography;  Surgeon: Tonny Bollman, MD;  Location: [facility] INVASIVE CV LAB;  Service: Cardiovascular;  Laterality: N/A;   CHOLECYSTECTOMY     COLONOSCOPY     GALLBLADDER SURGERY     TUBAL LIGATION      Prior to Admission medications   Medication Sig Start Date End Date Taking? Authorizing Provider  aspirin EC 81 MG EC tablet Take 1 tablet (81 mg total) by mouth daily. 06/04/15  Yes Azalee Course, PA  b complex vitamins tablet Take 1 tablet by mouth every other day.   Yes [provider]  carvedilol (COREG) 6.25 MG tablet  TAKE 1 TABLET BY MOUTH 2 TIMES DAILY WITH A MEAL. 10/26/22  Yes Tonny Bollman, MD  hydrochlorothiazide (HYDRODIURIL) 25 MG tablet TAKE 1/2 TABLET BY MOUTH EVERY DAY Patient taking differently: Take 12.5 mg by mouth. 1 every other day 12/09/22  Yes Tonny Bollman, MD  hydroxypropyl methylcellulose / hypromellose (ISOPTO TEARS / GONIOVISC) 2.5 % ophthalmic solution Place 1 drop into both eyes as needed for dry eyes.   Yes [provider]  lisinopril (ZESTRIL) 10 MG tablet TAKE 1 TABLET BY MOUTH EVERY MORNING AND AT BEDTIME 05/04/23  Yes Flossie Dibble, NP  VITAMIN D PO Take 2 tablets by mouth daily.   Yes [provider]  Alirocumab (PRALUENT) 75 MG/ML SOAJ Inject 1 mL (75 mg total) into the skin every 14 (fourteen) days. 07/27/23   [place], Betty G, MD  fluticasone [facility]) 50 MCG/ACT nasal spray Place 2 sprays into both nostrils as needed for allergies or rhinitis. Patient not taking: Reported on 10/14/2023    [provider]  nitroGLYCERIN (NITROSTAT) 0.4 MG SL tablet Place 1 tablet (0.4 mg total) under the tongue every 5 (five) minutes x 3 doses as needed for chest pain. Patient not taking: Reported on 10/14/2023 06/04/15   Azalee Course, PA    Current Outpatient Medications  Medication Sig Dispense Refill   aspirin EC 81 MG EC tablet Take 1 tablet (81 mg total) by mouth  daily.     b complex vitamins tablet Take 1 tablet by mouth every other day.     carvedilol (COREG) 6.25 MG tablet TAKE 1 TABLET BY MOUTH 2 TIMES DAILY WITH A MEAL. 180 tablet 3   hydrochlorothiazide (HYDRODIURIL) 25 MG tablet TAKE 1/2 TABLET BY MOUTH EVERY DAY (Patient taking differently: Take 12.5 mg by mouth. 1 every other day) 45 tablet 3   hydroxypropyl methylcellulose / hypromellose (ISOPTO TEARS / GONIOVISC) 2.5 % ophthalmic solution Place 1 drop into both eyes as needed for dry eyes.     lisinopril (ZESTRIL) 10 MG tablet TAKE 1 TABLET BY MOUTH EVERY MORNING AND AT BEDTIME 180 tablet 3   VITAMIN D  PO Take 2 tablets by mouth daily.     Alirocumab (PRALUENT) 75 MG/ML SOAJ Inject 1 mL (75 mg total) into the skin every 14 (fourteen) days. 6 mL 3   fluticasone (FLONASE) 50 MCG/ACT nasal spray Place 2 sprays into both nostrils as needed for allergies or rhinitis. (Patient not taking: Reported on 10/14/2023)     nitroGLYCERIN (NITROSTAT) 0.4 MG SL tablet Place 1 tablet (0.4 mg total) under the tongue every 5 (five) minutes x 3 doses as needed for chest pain. (Patient not taking: Reported on 10/14/2023) 25 tablet 3   Current Facility-Administered Medications  Medication Dose Route Frequency Provider Last Rate Last Admin   0.9 %  sodium chloride infusion  500 mL Intravenous Once Imogene Burn, MD        Allergies as of 10/14/2023 - Review Complete 10/14/2023  Allergen Reaction Noted   Atorvastatin Other (See Comments) 09/05/2020    Family History  Problem Relation Age of Onset   Valvular heart disease Mother    Breast cancer Mother        age 64   Colon polyps Mother    Irritable bowel syndrome Mother    GER disease Mother    Ulcers Mother    Heart attack Father    Colon polyps Father    Throat cancer Maternal Uncle    Colon cancer Neg Hx    Esophageal cancer Neg Hx    Rectal cancer Neg Hx    Stomach cancer Neg Hx     Social History   Socioeconomic History   Marital status: Widowed    Spouse name: Not on file   Number of children: Not on file   Years of education: Not on file   Highest education level: Not on file  Occupational History   Not on file  Tobacco Use   Smoking status: Former   Smokeless tobacco: Never   Tobacco comments:    quit when she was 30s  Vaping Use   Vaping status: Never Used  Substance and Sexual Activity   Alcohol use: No    Comment: ocassionally   Drug use: No   Sexual activity: Yes    Birth control/protection: None, Post-menopausal  Other Topics Concern   Not on file  Social History Narrative   Not on file   Social Drivers of Health    Financial Resource Strain: Low Risk  (07/23/2023)   Overall Financial Resource Strain (CARDIA)    Difficulty of Paying Living Expenses: Not very hard  Food Insecurity: No Food Insecurity (07/23/2023)   Hunger Vital Sign    Worried About Running Out of Food in the Last Year: Never true    Ran Out of Food in the Last Year: Never true  Transportation Needs: No Transportation Needs (07/23/2023)  PRAPARE - Administrator, Civil Service (Medical): No    Lack of Transportation (Non-Medical): No  Physical Activity: Sufficiently Active (12/01/2021)   Exercise Vital Sign    Days of Exercise per Week: 5 days    Minutes of Exercise per Session: 30 min  Stress: No Stress Concern Present (07/23/2023)   [employer] of Occupational Health - Occupational Stress Questionnaire    Feeling of Stress : Not at all  Social Connections: Moderately Isolated (07/23/2023)   Social Connection and Isolation Panel [NHANES]    Frequency of Communication with Friends and Family: Three times a week    Frequency of Social Gatherings with Friends and Family: Twice a week    Attends Religious Services: 1 to 4 times per year    Active Member of [employer] or Organizations: No    Attends Banker Meetings: Not on file    Marital Status: Widowed  Intimate Partner Violence: Not At Risk (12/01/2021)   Humiliation, Afraid, Rape, and Kick questionnaire    Fear of Current or Ex-Partner: No    Emotionally Abused: No    Physically Abused: No    Sexually Abused: No    Physical Exam: Vital signs in last 24 hours: BP 124/63   Pulse 73   Temp 98.4 F (36.9 C) (Skin)   Ht 5\' 3"  (1.6 m)   Wt 210 lb (95.3 kg)   SpO2 95%   BMI 37.20 kg/m  GEN: NAD EYE: Sclerae anicteric ENT: MMM CV: Non-tachycardic Pulm: No increased work of breathing GI: Soft, NT/ND NEURO:  Alert & Oriented   Eulah Pont, MD [facility]  10/14/2023 1:57 PM

## 2023-10-14 NOTE — Progress Notes (Signed)
Called to room to assist during endoscopic procedure.  Patient ID and intended procedure confirmed with present staff. Received instructions for my participation in the procedure from the performing physician.

## 2023-10-14 NOTE — Progress Notes (Signed)
Pt's states no medical or surgical changes since previsit or office visit.

## 2023-10-14 NOTE — Patient Instructions (Signed)

## 2023-10-15 ENCOUNTER — Telehealth: Payer: Self-pay | Admitting: *Deleted

## 2023-10-15 NOTE — Telephone Encounter (Signed)
  Follow up Call-     10/14/2023    1:43 PM 02/04/2022    8:33 AM  Call back number  Post procedure Call Back phone  # [phone]  Permission to leave phone message Yes Yes     Patient questions:  Do you have a fever, pain , or abdominal swelling? No. Pain Score  0 *  Have you tolerated food without any problems? Yes.    Have you been able to return to your normal activities? Yes.    Do you have any questions about your discharge instructions: Diet   No. Medications  No. Follow up visit  No.  Do you have questions or concerns about your Care? No.  Actions: * If pain score is 4 or above: No action needed, pain <4.

## 2023-10-19 LAB — SURGICAL PATHOLOGY

## 2023-10-20 ENCOUNTER — Encounter: Payer: Self-pay | Admitting: Internal Medicine

## 2023-10-30 ENCOUNTER — Other Ambulatory Visit: Payer: Self-pay | Admitting: Cardiovascular Disease

## 2023-10-30 DIAGNOSIS — I255 Ischemic cardiomyopathy: Secondary | ICD-10-CM

## 2023-10-30 DIAGNOSIS — I2510 Atherosclerotic heart disease of native coronary artery without angina pectoris: Secondary | ICD-10-CM

## 2023-12-27 ENCOUNTER — Other Ambulatory Visit: Payer: Self-pay | Admitting: Family Medicine

## 2023-12-27 DIAGNOSIS — Z1231 Encounter for screening mammogram for malignant neoplasm of breast: Secondary | ICD-10-CM

## 2024-01-25 ENCOUNTER — Ambulatory Visit (INDEPENDENT_AMBULATORY_CARE_PROVIDER_SITE_OTHER): Admitting: Family Medicine

## 2024-01-25 ENCOUNTER — Ambulatory Visit: Payer: Self-pay | Admitting: Family Medicine

## 2024-01-25 ENCOUNTER — Encounter: Payer: Self-pay | Admitting: Family Medicine

## 2024-01-25 VITALS — BP 122/80 | HR 70 | Resp 16 | Ht 63.0 in | Wt 218.4 lb

## 2024-01-25 DIAGNOSIS — N1831 Chronic kidney disease, stage 3a: Secondary | ICD-10-CM

## 2024-01-25 DIAGNOSIS — R7989 Other specified abnormal findings of blood chemistry: Secondary | ICD-10-CM

## 2024-01-25 DIAGNOSIS — E7800 Pure hypercholesterolemia, unspecified: Secondary | ICD-10-CM | POA: Diagnosis not present

## 2024-01-25 DIAGNOSIS — I10 Essential (primary) hypertension: Secondary | ICD-10-CM

## 2024-01-25 DIAGNOSIS — L237 Allergic contact dermatitis due to plants, except food: Secondary | ICD-10-CM

## 2024-01-25 DIAGNOSIS — E1169 Type 2 diabetes mellitus with other specified complication: Secondary | ICD-10-CM

## 2024-01-25 LAB — CBC WITH DIFFERENTIAL/PLATELET
Basophils Absolute: 0.1 10*3/uL (ref 0.0–0.1)
Basophils Relative: 1.2 % (ref 0.0–3.0)
Eosinophils Absolute: 0.4 10*3/uL (ref 0.0–0.7)
Eosinophils Relative: 10.3 % — ABNORMAL HIGH (ref 0.0–5.0)
HCT: 44.9 % (ref 36.0–46.0)
Hemoglobin: 15.5 g/dL — ABNORMAL HIGH (ref 12.0–15.0)
Lymphocytes Relative: 30.2 % (ref 12.0–46.0)
Lymphs Abs: 1.3 10*3/uL (ref 0.7–4.0)
MCHC: 34.4 g/dL (ref 30.0–36.0)
MCV: 92.5 fl (ref 78.0–100.0)
Monocytes Absolute: 0.4 10*3/uL (ref 0.1–1.0)
Monocytes Relative: 10.3 % (ref 3.0–12.0)
Neutro Abs: 2.1 10*3/uL (ref 1.4–7.7)
Neutrophils Relative %: 48 % (ref 43.0–77.0)
Platelets: 102 10*3/uL — ABNORMAL LOW (ref 150.0–400.0)
RBC: 4.86 Mil/uL (ref 3.87–5.11)
RDW: 13.2 % (ref 11.5–15.5)
WBC: 4.4 10*3/uL (ref 4.0–10.5)

## 2024-01-25 LAB — LIPID PANEL
Cholesterol: 133 mg/dL (ref 0–200)
HDL: 57.1 mg/dL (ref >39.00)
LDL Cholesterol: 53 mg/dL (ref 0–99)
NonHDL: 75.92
Total CHOL/HDL Ratio: 2
Triglycerides: 113 mg/dL (ref 0.0–149.0)
VLDL: 22.6 mg/dL (ref 0.0–40.0)

## 2024-01-25 LAB — BASIC METABOLIC PANEL WITH GFR
BUN: 16 mg/dL (ref 6–23)
CO2: 29 meq/L (ref 19–32)
Calcium: 9.9 mg/dL (ref 8.4–10.5)
Chloride: 102 meq/L (ref 96–112)
Creatinine, Ser: 0.94 mg/dL (ref 0.40–1.20)
GFR: 59.15 mL/min — ABNORMAL LOW (ref >60.00)
Glucose, Bld: 141 mg/dL — ABNORMAL HIGH (ref 70–99)
Potassium: 3.8 meq/L (ref 3.5–5.1)
Sodium: 140 meq/L (ref 135–145)

## 2024-01-25 LAB — MICROALBUMIN / CREATININE URINE RATIO
Creatinine,U: 173.7 mg/dL
Microalb Creat Ratio: 7.1 mg/g (ref 0.0–30.0)
Microalb, Ur: 1.2 mg/dL (ref 0.0–1.9)

## 2024-01-25 LAB — HEPATIC FUNCTION PANEL
ALT: 30 U/L (ref 0–35)
AST: 40 U/L — ABNORMAL HIGH (ref 0–37)
Albumin: 4.4 g/dL (ref 3.5–5.2)
Alkaline Phosphatase: 64 U/L (ref 39–117)
Bilirubin, Direct: 0.2 mg/dL (ref 0.0–0.3)
Total Bilirubin: 1 mg/dL (ref 0.2–1.2)
Total Protein: 7.6 g/dL (ref 6.0–8.3)

## 2024-01-25 LAB — HEMOGLOBIN A1C: Hgb A1c MFr Bld: 7.1 % — ABNORMAL HIGH (ref 4.6–6.5)

## 2024-01-25 MED ORDER — TRIAMCINOLONE ACETONIDE 0.1 % EX CREA
1.0000 | TOPICAL_CREAM | Freq: Two times a day (BID) | CUTANEOUS | 1 refills | Status: AC | PRN
Start: 2024-01-25 — End: ?

## 2024-01-25 MED ORDER — PREDNISONE 20 MG PO TABS
40.0000 mg | ORAL_TABLET | Freq: Every day | ORAL | 0 refills | Status: AC
Start: 2024-01-25 — End: 2024-01-30

## 2024-01-25 NOTE — Assessment & Plan Note (Signed)
 Has not tolerated statins in the past and history of elevated transaminases. LDL 53 in 12/2022. Continue Praluent  75 mg every 2 weeks. Following with cardiologist.

## 2024-01-25 NOTE — Assessment & Plan Note (Signed)
 Problem has been stable, e GFR 48 and Cr 1.12 in 07/2023. Continue adequate glucose and BP controlled. Low-salt diet,adequate hydration, and avoidance of NSAIDs to continue.

## 2024-01-25 NOTE — Assessment & Plan Note (Signed)
 HgA1C was at goal in 07/2023, 6.7. Currently on non pharmacologic treatment.. Annual eye exam and periodic foot care to continue. F/U in 5-6 months.

## 2024-01-25 NOTE — Progress Notes (Signed)
 HPI: [name] is a 78 y.o. female with a PMHx significant for CAD, HTN, NSTEMI, DM II, statin myopathy, CKD III, thrombocytopenia, HLD, and vitamin D  deficiency, who is here today for chronic disease management.  Last seen on 07/27/2023  Hypertension: Currently on hydrochlorothiazide  12.5 mg every other day and lisinopril  10 mg daily.  BP readings at home: Not checking her BP at home.  Negative for unusual or severe headache, visual changes, exertional chest pain, dyspnea,  focal weakness, or edema.  CKD III: Negative for foam in urine or decreased urine output.  Lab Results  Component Value Date   NA 137 07/27/2023   CL 102 07/27/2023   K 3.7 07/27/2023   CO2 25 07/27/2023   BUN 27 (H) 07/27/2023   CREATININE 1.12 07/27/2023   GFR 48.10 (L) 07/27/2023   CALCIUM  9.4 07/27/2023   ALBUMIN 4.2 07/27/2023   GLUCOSE 108 (H) 07/27/2023   Hyperlipidemia: Currently on Praluent  75 mg every 2 weeks.  She has had statin myopathies in the past.   Lab Results  Component Value Date   CHOL 126 01/22/2023   HDL 53.90 01/22/2023   LDLCALC 53 01/22/2023   TRIG 95.0 01/22/2023   CHOLHDL 2 01/22/2023   Abnormal LFT's: Negative for abdominal pain or jaundice. Liver US  done in 01/2022:The gallbladder is surgically absent. The study is otherwise normal.   Lab Results  Component Value Date   ALT 21 07/27/2023   AST 36 07/27/2023   ALKPHOS 57 07/27/2023   BILITOT 1.6 (H) 07/27/2023   Diabetes Mellitus II: Diagnosed in 07/2018  - She has not been checking her blood sugar regularly at home. She reports she had an A1c done in February during her colonoscopy and it was 6.2.  - Medications: Not currently on pharmacologic treatment.  - Exercise: She works in her yard but does not  have an exercise routine.  - Diet: No changes since her last visit. - eye exam: 12/2022, she is due for another one.  - foot exam: 12/2022. - Negative for symptoms of hypoglycemia, polyuria, polydipsia,  numbness extremities, foot ulcers/trauma  Lab Results  Component Value Date   HGBA1C 6.7 (H) 07/27/2023   Lab Results  Component Value Date   MICROALBUR <0.7 01/22/2023   Concerns today:   Patient complains of a rash on her legs, arms, neck, and chest for about three weeks. She believes it came from poison ivy or poison oak.  She tries not to scratch but says it is very pruritic.  She has been using calamine.  Negative for fever or myalgias.  Review of Systems  Constitutional:  Negative for activity change, appetite change and fever.  HENT:  Negative for mouth sores and sore throat.   Respiratory:  Negative for cough and wheezing.   Gastrointestinal:  Negative for nausea and vomiting.  Genitourinary:  Negative for dysuria and hematuria.  Skin:  Positive for rash.  Neurological:  Negative for syncope and facial asymmetry.  See other pertinent positives and negatives in HPI.  Current Outpatient Medications on File Prior to Visit  Medication Sig Dispense Refill   Alirocumab  (PRALUENT ) 75 MG/ML SOAJ Inject 1 mL (75 mg total) into the skin every 14 (fourteen) days. 6 mL 3   aspirin  EC 81 MG EC tablet Take 1 tablet (81 mg total) by mouth daily.     b complex vitamins tablet Take 1 tablet by mouth every other day.     carvedilol  (COREG ) 6.25 MG tablet TAKE  1 TABLET BY MOUTH TWICE A DAY WITH FOOD 180 tablet 3   fluticasone (FLONASE) 50 MCG/ACT nasal spray Place 2 sprays into both nostrils as needed for allergies or rhinitis.     hydrochlorothiazide  (HYDRODIURIL ) 25 MG tablet TAKE 1/2 TABLET BY MOUTH EVERY DAY (Patient taking differently: Take 12.5 mg by mouth. 1 every other day) 45 tablet 3   hydroxypropyl methylcellulose / hypromellose (ISOPTO TEARS / GONIOVISC) 2.5 % ophthalmic solution Place 1 drop into both eyes as needed for dry eyes.     lisinopril  (ZESTRIL ) 10 MG tablet TAKE 1 TABLET BY MOUTH EVERY MORNING AND AT BEDTIME 180 tablet 3   nitroGLYCERIN  (NITROSTAT ) 0.4 MG SL tablet  Place 1 tablet (0.4 mg total) under the tongue every 5 (five) minutes x 3 doses as needed for chest pain. 25 tablet 3   VITAMIN D  PO Take 2 tablets by mouth daily.     No current facility-administered medications on file prior to visit.    Past Medical History:  Diagnosis Date   Allergy    CAD (coronary artery disease)    cath 06/02/2015 25% mid RCA, 25% prox LCx, 100% prox LAD treated with DES (3.0 x 15 mm Xience), EF 40%   Cataract    Chicken pox    Former smoker    Heart disease    Hyperlipidemia    Hypertension    Measles    Mild ichemic cardiomyopathy, EF 45-50% post cath 06/2015 06/04/2015   NSTEMI (non-ST elevated myocardial infarction) (HCC) 06/02/2015   Type 2 diabetes mellitus without complication (HCC) 06/22/2019   Allergies  Allergen Reactions   Atorvastatin  Other (See Comments)    myalgia    Social History   Socioeconomic History   Marital status: Widowed    Spouse name: Not on file   Number of children: Not on file   Years of education: Not on file   Highest education level: 12th grade  Occupational History   Not on file  Tobacco Use   Smoking status: Former   Smokeless tobacco: Never   Tobacco comments:    quit when she was 30s  Vaping Use   Vaping status: Never Used  Substance and Sexual Activity   Alcohol use: No    Comment: ocassionally   Drug use: No   Sexual activity: Yes    Birth control/protection: None, Post-menopausal  Other Topics Concern   Not on file  Social History Narrative   Not on file   Social Drivers of Health   Financial Resource Strain: Low Risk  (01/21/2024)   Overall Financial Resource Strain (CARDIA)    Difficulty of Paying Living Expenses: Not very hard  Food Insecurity: Patient Declined (01/21/2024)   Hunger Vital Sign    Worried About Running Out of Food in the Last Year: Patient declined    Ran Out of Food in the Last Year: Patient declined  Transportation Needs: No Transportation Needs (01/21/2024)   PRAPARE -  Administrator, Civil Service (Medical): No    Lack of Transportation (Non-Medical): No  Physical Activity: Insufficiently Active (01/21/2024)   Exercise Vital Sign    Days of Exercise per Week: 3 days    Minutes of Exercise per Session: 30 min  Stress: No Stress Concern Present (01/21/2024)   [employer] of Occupational Health - Occupational Stress Questionnaire    Feeling of Stress : Not at all  Social Connections: Moderately Isolated (01/21/2024)   Social Connection and Isolation Panel [NHANES]  Frequency of Communication with Friends and Family: Three times a week    Frequency of Social Gatherings with Friends and Family: Twice a week    Attends Religious Services: 1 to 4 times per year    Active Member of [employer] or Organizations: No    Attends Banker Meetings: Not on file    Marital Status: Widowed   Vitals:   01/25/24 1013  BP: 122/80  Pulse: 70  Resp: 16  SpO2: 95%   Body mass index is 38.68 kg/m.  Physical Exam Vitals and nursing note reviewed.  Constitutional:      General: She is not in acute distress.    Appearance: She is well-developed.  HENT:     Head: Normocephalic and atraumatic.     Mouth/Throat:     Mouth: Mucous membranes are moist.     Pharynx: Oropharynx is clear.  Eyes:     Conjunctiva/sclera: Conjunctivae normal.  Cardiovascular:     Rate and Rhythm: Normal rate and regular rhythm.     Pulses:          Dorsalis pedis pulses are 2+ on the right side and 2+ on the left side.     Heart sounds: No murmur heard. Pulmonary:     Effort: Pulmonary effort is normal. No respiratory distress.     Breath sounds: Normal breath sounds.  Abdominal:     Palpations: Abdomen is soft. There is no hepatomegaly or mass.     Tenderness: There is no abdominal tenderness.  Musculoskeletal:     Right lower leg: No edema.     Left lower leg: No edema.  Skin:    General: Skin is warm.     Findings: Rash present. No erythema.      Comments: Lesions scattered on the distal right leg, left arm, around her neck covered with calamine lotion.  Neurological:     General: No focal deficit present.     Mental Status: She is alert and oriented to person, place, and time.     Cranial Nerves: No cranial nerve deficit.     Gait: Gait normal.  Psychiatric:        Mood and Affect: Mood and affect normal.   ASSESSMENT AND PLAN:  Mr. Lim was seen today for chronic disease management.   Orders Placed This Encounter  Procedures   Basic metabolic panel with GFR   Hepatic function panel   Microalbumin / creatinine urine ratio   Lipid panel   Hemoglobin A1c   CBC with Differential/Platelet   Lab Results  Component Value Date   MICROALBUR 1.2 01/25/2024   MICROALBUR <0.7 01/22/2023   Lab Results  Component Value Date   HGBA1C 7.1 (H) 01/25/2024   Lab Results  Component Value Date   ALT 30 01/25/2024   AST 40 (H) 01/25/2024   ALKPHOS 64 01/25/2024   BILITOT 1.0 01/25/2024   Lab Results  Component Value Date   NA 140 01/25/2024   CL 102 01/25/2024   K 3.8 01/25/2024   CO2 29 01/25/2024   BUN 16 01/25/2024   CREATININE 0.94 01/25/2024   GFR 59.15 (L) 01/25/2024   CALCIUM  9.9 01/25/2024   ALBUMIN 4.4 01/25/2024   GLUCOSE 141 (H) 01/25/2024   Lab Results  Component Value Date   CHOL 133 01/25/2024   HDL 57.10 01/25/2024   LDLCALC 53 01/25/2024   TRIG 113.0 01/25/2024   CHOLHDL 2 01/25/2024   Lab Results  Component Value Date  WBC 4.4 01/25/2024   HGB 15.5 (H) 01/25/2024   HCT 44.9 01/25/2024   MCV 92.5 01/25/2024   PLT 102.0 (L) 01/25/2024   Type 2 diabetes mellitus with other specified complication, without long-term current use of insulin (HCC) Assessment & Plan: HgA1C was at goal in 07/2023, 6.7. Currently on non pharmacologic treatment.. Annual eye exam and periodic foot care to continue. F/U in 5-6 months.  Orders: -     Microalbumin / creatinine urine ratio; Future -     Hemoglobin  A1c; Future  Pure hypercholesterolemia Assessment & Plan: Has not tolerated statins in the past and history of elevated transaminases. LDL 53 in 12/2022. Continue Praluent  75 mg every 2 weeks. Following with cardiologist.  Orders: -     Lipid panel; Future  Contact dermatitis due to poison ivy We discussed Dx and treatment options. Because the extension she benefits from systemic steroid treatment, side effects discussed. Recommend Prednisone 40 mg for 3-5 days. Topical Triamcinolone cream bid prn. Try to avoid contact with poison ivy or oak. F/U as needed.   -     predniSONE; Take 2 tablets (40 mg total) by mouth daily with breakfast for 5 days.  Dispense: 10 tablet; Refill: 0 -     Triamcinolone Acetonide; Apply 1 Application topically 2 (two) times daily as needed.  Dispense: 30 g; Refill: 1  Essential hypertension Assessment & Plan: BP adequately controlled. Continue lisinopril  10 mg daily, carvedilol  6.25 mg twice daily, and HCTZ 25 mg 1/2 tablet daily and well as low-salt/DASH diet. Recommend monitoring BP at home.  Orders: -     Basic metabolic panel with GFR; Future  Abnormal LFTs Assessment & Plan: Stable overall. Last RUQ US  in 01/2023 negative. Continue avoiding alcohol intake. Wt loss may also help as well as low carb and low fat diet. Further recommendation will be given according to lab results.  Orders: -     Hepatic function panel; Future -     CBC with Differential/Platelet; Future  Stage 3a chronic kidney disease (HCC) Assessment & Plan: Problem has been stable, e GFR 48 and Cr 1.12 in 07/2023. Continue adequate glucose and BP controlled. Low-salt diet,adequate hydration, and avoidance of NSAIDs to continue.   Return in about 6 months (around 07/27/2024) for chronic problems.  I, Fritz Jewel Wierda, acting as a scribe for Chelsae Zanella [place], MD., have documented all relevant documentation on the behalf of Miranda Sabino [place], MD, as directed by  Miranda Deike [place], MD  while in the presence of Letcher Schweikert [place], MD.   I, Michaila Kenney [place], MD, have reviewed all documentation for this visit. The documentation on 01/25/24 for the exam, diagnosis, procedures, and orders are all accurate and complete.  Scotlynn Noyes G. [place], MD  [facility]. [place] office.

## 2024-01-25 NOTE — Assessment & Plan Note (Signed)
 Stable overall. Last RUQ US  in 01/2023 negative. Continue avoiding alcohol intake. Wt loss may also help as well as low carb and low fat diet. Further recommendation will be given according to lab results.

## 2024-01-25 NOTE — Assessment & Plan Note (Signed)
 BP adequately controlled. Continue lisinopril  10 mg daily, carvedilol  6.25 mg twice daily, and HCTZ 25 mg 1/2 tablet daily and well as low-salt/DASH diet. Recommend monitoring BP at home.

## 2024-01-25 NOTE — Patient Instructions (Addendum)
 A few things to remember from today's visit:  Type 2 diabetes mellitus with other specified complication, without long-term current use of insulin (HCC)  Pure hypercholesterolemia  Contact dermatitis due to poison ivy  Essential hypertension  Abnormal LFTs  Stage 3a chronic kidney disease (HCC) Take Prednisone with breakfast for 3-5 days. Triamcinolone cream, small amount on affected areas as needed up to 2 times.  If you need refills for medications you take chronically, please call your pharmacy. Do not use My Chart to request refills or for acute issues that need immediate attention. If you send a my chart message, it may take a few days to be addressed, specially if I am not in the office.  Please be sure medication list is accurate. If a new problem present, please set up appointment sooner than planned today.

## 2024-02-09 ENCOUNTER — Ambulatory Visit

## 2024-02-10 ENCOUNTER — Ambulatory Visit
Admission: RE | Admit: 2024-02-10 | Discharge: 2024-02-10 | Disposition: A | Discharge status: Home / Self Care | Source: Ambulatory Visit | Attending: Family Medicine | Admitting: Family Medicine

## 2024-02-10 DIAGNOSIS — Z1231 Encounter for screening mammogram for malignant neoplasm of breast: Secondary | ICD-10-CM

## 2024-03-24 ENCOUNTER — Other Ambulatory Visit (HOSPITAL_COMMUNITY): Payer: Self-pay

## 2024-03-24 ENCOUNTER — Telehealth: Payer: Self-pay | Admitting: Pharmacy Technician

## 2024-03-24 NOTE — Telephone Encounter (Signed)
 Pharmacy Patient Advocate Encounter   Received notification from Physician's Office that prior authorization for Praluent  75MG  is required/requested.   Insurance verification completed.   The patient is insured through [facility] .   Per test claim: The current 03/24/24 day co-pay is, $307.74- 3 months.  No PA needed at this time. This test claim was processed through [facility]- copay amounts may vary at other pharmacies due to pharmacy/plan contracts, or as the patient moves through the different stages of their insurance plan.     PA on file until 08/30/2098

## 2024-03-24 NOTE — Telephone Encounter (Signed)
 Patient Advocate Encounter   The patient was approved for a [employer] grant that will help cover the cost of Praluent  Total amount awarded, 2500.00.  Effective: 02/23/24 - 02/21/25   APW:[id number] ERW:[id number] Hmnle:[id number] PI:[id number]  [employer] ID: [id number]   Pharmacy provided with approval and processing information. Patient informed via mychart

## 2024-04-20 ENCOUNTER — Encounter: Payer: Self-pay | Admitting: Family Medicine

## 2024-04-20 DIAGNOSIS — H16223 Keratoconjunctivitis sicca, not specified as Sjogren's, bilateral: Secondary | ICD-10-CM | POA: Diagnosis not present

## 2024-04-20 DIAGNOSIS — Z961 Presence of intraocular lens: Secondary | ICD-10-CM | POA: Diagnosis not present

## 2024-04-20 DIAGNOSIS — H43813 Vitreous degeneration, bilateral: Secondary | ICD-10-CM | POA: Diagnosis not present

## 2024-04-20 LAB — HM DIABETES EYE EXAM

## 2024-04-21 ENCOUNTER — Encounter: Payer: Self-pay | Admitting: Family Medicine

## 2024-05-24 DIAGNOSIS — Z23 Encounter for immunization: Secondary | ICD-10-CM | POA: Diagnosis not present

## 2024-07-31 ENCOUNTER — Ambulatory Visit: Admitting: Family Medicine

## 2024-07-31 ENCOUNTER — Encounter: Payer: Self-pay | Admitting: Family Medicine

## 2024-07-31 VITALS — BP 124/70 | HR 51 | Temp 97.9°F | Resp 16 | Ht 63.0 in | Wt 211.6 lb

## 2024-07-31 DIAGNOSIS — R001 Bradycardia, unspecified: Secondary | ICD-10-CM

## 2024-07-31 DIAGNOSIS — N1831 Chronic kidney disease, stage 3a: Secondary | ICD-10-CM | POA: Diagnosis not present

## 2024-07-31 DIAGNOSIS — E1122 Type 2 diabetes mellitus with diabetic chronic kidney disease: Secondary | ICD-10-CM | POA: Diagnosis not present

## 2024-07-31 DIAGNOSIS — E1169 Type 2 diabetes mellitus with other specified complication: Secondary | ICD-10-CM

## 2024-07-31 DIAGNOSIS — I10 Essential (primary) hypertension: Secondary | ICD-10-CM

## 2024-07-31 DIAGNOSIS — E1159 Type 2 diabetes mellitus with other circulatory complications: Secondary | ICD-10-CM | POA: Diagnosis not present

## 2024-07-31 LAB — BASIC METABOLIC PANEL WITH GFR
BUN: 12 mg/dL (ref 6–23)
CO2: 29 meq/L (ref 19–32)
Calcium: 9.4 mg/dL (ref 8.4–10.5)
Chloride: 106 meq/L (ref 96–112)
Creatinine, Ser: 0.78 mg/dL (ref 0.40–1.20)
GFR: 73.72 mL/min (ref >60.00)
Glucose, Bld: 99 mg/dL (ref 70–99)
Potassium: 4 meq/L (ref 3.5–5.1)
Sodium: 142 meq/L (ref 135–145)

## 2024-07-31 LAB — POCT GLYCOSYLATED HEMOGLOBIN (HGB A1C): Hemoglobin A1C: 5.6 % (ref 4.0–5.6)

## 2024-07-31 NOTE — Assessment & Plan Note (Addendum)
 With associated HTN,HLD, CKD. HgA1C is at goal, it went from 7.1 to 5.6 today. Continue nonpharmacologic treatment Eye exam is current. Continue appropriate footcare. Follow-up in 6 months.

## 2024-07-31 NOTE — Progress Notes (Unsigned)
 HPI:  Chief Complaint  Patient presents with   Medical Management of Chronic Issues    Miranda Wong is a 78 y.o. female, who is here today for chronic disease management. Last seen on *** Discussed the use of AI scribe software for clinical note transcription with the patient, who gave verbal consent to proceed.  History of Present Illness Miranda Wong is a 78 year old female with diabetes who presents for a follow-up visit.  She has a history of diabetes and has not been checking her blood sugars regularly. Her last hemoglobin A1c was 7.1%, which has improved to 5.6% since her last visit in May. She attributes this improvement to dietary changes, including drinking zero sugar drinks and reducing her intake of sweets and bread. She has lost seven pounds since her last visit.  It was last checked in May. Her recent cholesterol measurement was 53 mg/dL.  She denies starting any new exercise regimen but remains active by walking her dog and performing range of motion exercises while sitting. Her weight has decreased from 218 pounds to 211 pounds since her last visit.  She mentions a history of a fungal infection in her toenail following a pedicure, which she is still dealing with. No history of asthma or chronic wheezing, but she noticed some wheezing the other day while coughing. No increased fatigue and she reports feeling well overall.   Hypertension:  Medications:*** BP readings at home:*** Side effects:***  Negative for unusual or severe headache, visual changes, exertional chest pain, dyspnea,  focal weakness, or edema.  Lab Results  Component Value Date   NA 140 01/25/2024   CL 102 01/25/2024   K 3.8 01/25/2024   CO2 29 01/25/2024   BUN 16 01/25/2024   CREATININE 0.94 01/25/2024   GFR 59.15 (L) 01/25/2024   CALCIUM  9.9 01/25/2024   ALBUMIN 4.4 01/25/2024   GLUCOSE 141 (H) 01/25/2024      Diabetes Mellitus II: Diagnosed in 07/2018  - Checking BG at home:  *** - Medications: *** - Compliance: *** - Diet: *** - Exercise: *** - eye exam: *** - foot exam: ***  - Negative for symptoms of hypoglycemia, polyuria, polydipsia, numbness extremities, foot ulcers/trauma ***  Lab Results  Component Value Date   MICROALBUR 1.2 01/25/2024    Review of Systems See other pertinent positives and negatives in HPI.  Current Outpatient Medications on File Prior to Visit  Medication Sig Dispense Refill   Alirocumab  (PRALUENT ) 75 MG/ML SOAJ Inject 1 mL (75 mg total) into the skin every 14 (fourteen) days. 6 mL 3   aspirin  EC 81 MG EC tablet Take 1 tablet (81 mg total) by mouth daily.     b complex vitamins tablet Take 1 tablet by mouth every other day.     carvedilol  (COREG ) 6.25 MG tablet TAKE 1 TABLET BY MOUTH TWICE A DAY WITH FOOD 180 tablet 3   fluticasone (FLONASE) 50 MCG/ACT nasal spray Place 2 sprays into both nostrils as needed for allergies or rhinitis.     hydroxypropyl methylcellulose / hypromellose (ISOPTO TEARS / GONIOVISC) 2.5 % ophthalmic solution Place 1 drop into both eyes as needed for dry eyes.     lisinopril  (ZESTRIL ) 10 MG tablet TAKE 1 TABLET BY MOUTH EVERY MORNING AND AT BEDTIME 180 tablet 3   nitroGLYCERIN  (NITROSTAT ) 0.4 MG SL tablet Place 1 tablet (0.4 mg total) under the tongue every 5 (five) minutes x 3 doses as needed for chest pain. 25 tablet  3   triamcinolone  cream (KENALOG ) 0.1 % Apply 1 Application topically 2 (two) times daily as needed. 30 g 1   VITAMIN D  PO Take 2 tablets by mouth daily.     hydrochlorothiazide  (HYDRODIURIL ) 25 MG tablet TAKE 1/2 TABLET BY MOUTH EVERY DAY (Patient taking differently: Take 12.5 mg by mouth. 1 every other day) 45 tablet 3   No current facility-administered medications on file prior to visit.    Past Medical History:  Diagnosis Date   Allergy    CAD (coronary artery disease)    cath 06/02/2015 25% mid RCA, 25% prox LCx, 100% prox LAD treated with DES (3.0 x 15 mm Xience), EF 40%    Cataract    Chicken pox    Former smoker    Heart disease    Hyperlipidemia    Hypertension    Measles    Mild ichemic cardiomyopathy, EF 45-50% post cath 06/2015 06/04/2015   NSTEMI (non-ST elevated myocardial infarction) (HCC) 06/02/2015   Type 2 diabetes mellitus without complication 06/22/2019    Allergies  Allergen Reactions   Atorvastatin  Other (See Comments)    myalgia    Social History   Socioeconomic History   Marital status: Widowed    Spouse name: Not on file   Number of children: Not on file   Years of education: Not on file   Highest education level: 12th grade  Occupational History   Not on file  Tobacco Use   Smoking status: Former   Smokeless tobacco: Never   Tobacco comments:    quit when she was 30s  Vaping Use   Vaping status: Never Used  Substance and Sexual Activity   Alcohol use: No    Comment: ocassionally   Drug use: No   Sexual activity: Yes    Birth control/protection: None, Post-menopausal  Other Topics Concern   Not on file  Social History Narrative   Not on file   Social Drivers of Health   Financial Resource Strain: Low Risk  (07/27/2024)   Overall Financial Resource Strain (CARDIA)    Difficulty of Paying Living Expenses: Not very hard  Food Insecurity: No Food Insecurity (07/27/2024)   Hunger Vital Sign    Worried About Running Out of Food in the Last Year: Never true    Ran Out of Food in the Last Year: Never true  Transportation Needs: No Transportation Needs (07/27/2024)   PRAPARE - Administrator, Civil Service (Medical): No    Lack of Transportation (Non-Medical): No  Physical Activity: Insufficiently Active (07/27/2024)   Exercise Vital Sign    Days of Exercise per Week: 3 days    Minutes of Exercise per Session: 30 min  Stress: No Stress Concern Present (07/27/2024)   [employer] of Occupational Health - Occupational Stress Questionnaire    Feeling of Stress: Not at all  Social Connections:  Moderately Isolated (07/27/2024)   Social Connection and Isolation Panel    Frequency of Communication with Friends and Family: Twice a week    Frequency of Social Gatherings with Friends and Family: Three times a week    Attends Religious Services: 1 to 4 times per year    Active Member of Clubs or Organizations: No    Attends Banker Meetings: Not on file    Marital Status: Widowed    Today's Vitals   07/31/24 0957  BP: 124/70  Pulse: (!) 51  Resp: 16  Temp: 97.9 F (36.6 C)  SpO2: 97%  Weight: 211 lb 9.6 oz (96 kg)  Height: 5' 3 (1.6 m)   Wt Readings from Last 3 Encounters:  07/31/24 211 lb 9.6 oz (96 kg)  01/25/24 218 lb 6 oz (99.1 kg)  10/14/23 210 lb (95.3 kg)   Body mass index is 37.48 kg/m.  Physical Exam Vitals and nursing note reviewed.  Constitutional:      General: She is not in acute distress.    Appearance: She is well-developed.  HENT:     Head: Normocephalic and atraumatic.     Mouth/Throat:     Mouth: Mucous membranes are moist.     Pharynx: Oropharynx is clear.  Eyes:     Conjunctiva/sclera: Conjunctivae normal.  Cardiovascular:     Rate and Rhythm: Regular rhythm. Bradycardia present.     Pulses:          Dorsalis pedis pulses are 2+ on the right side and 2+ on the left side.     Heart sounds: No murmur heard. Pulmonary:     Effort: Pulmonary effort is normal. No respiratory distress.     Breath sounds: Normal breath sounds.  Abdominal:     Palpations: Abdomen is soft. There is no hepatomegaly or mass.     Tenderness: There is no abdominal tenderness.  Lymphadenopathy:     Cervical: No cervical adenopathy.  Skin:    General: Skin is warm.     Findings: No erythema or rash.  Neurological:     General: No focal deficit present.     Mental Status: She is alert and oriented to person, place, and time.     Cranial Nerves: No cranial nerve deficit.     Gait: Gait normal.  Psychiatric:     Comments: Well groomed, good eye  contact.     ASSESSMENT AND PLAN:  Miranda Wong was seen today for medical management of chronic issues.  Diagnoses and all orders for this visit:  Type 2 diabetes mellitus with other specified complication, without long-term current use of insulin (HCC) -     POC HgB A1c  Essential hypertension  Stage 3a chronic kidney disease (HCC) -     Basic metabolic panel with GFR; Future -     Basic metabolic panel with GFR  Sinus bradycardia  Morbid obesity (HCC) with BMI 37 and HTN,DMII,HLD,CKD    Orders Placed This Encounter  Procedures   Basic metabolic panel with GFR   POC HgB A1c    Type 2 diabetes mellitus with other specified complication (HCC) With associated HTN,HLD, CKD. HgA1C is at goal, it went from 7.1 to 5.6 today. Continue nonpharmacologic treatment Eye exam is current. Continue appropriate footcare. Follow-up in 6 months.  Stage 3a chronic kidney disease (HCC) Problem has been stable. Creatinine 0.9 and eGFR 59 in 12/2023. Continue adequate BP and glucose control as well as hydration. Continue avoiding NSAIDs. Low-salt diet to continue. Currently on lisinopril  10 mg daily.  Morbid obesity (HCC) with BMI 37 and HTN,DMII,HLD,CKD With associated DM2, hyperlipidemia, and CKD. She has lost about 7 pounds since 12/2023. Consistency with healthy diet and physical activity encouraged.  ***Most likely related with carvedilol , she is taking 6.25 mg twice daily. Recommend monitoring HR at home. Appointment with cardiologist in 08/2024.  Return in about 6 months (around 01/29/2025) for chronic problems.   Ayrabella Labombard, MD [facility]. [place] office.

## 2024-07-31 NOTE — Assessment & Plan Note (Signed)
 With associated DM2, hyperlipidemia, and CKD. She has lost about 7 pounds since 12/2023. Consistency with healthy diet and physical activity encouraged.

## 2024-07-31 NOTE — Assessment & Plan Note (Addendum)
 Problem has been stable. Creatinine 0.9 and eGFR 59 in 12/2023. Continue adequate BP and glucose control as well as hydration. Continue avoiding NSAIDs. Low-salt diet to continue. Currently on lisinopril  10 mg daily.

## 2024-07-31 NOTE — Patient Instructions (Addendum)
 A few things to remember from today's visit:  Type 2 diabetes mellitus with other specified complication, without long-term current use of insulin (HCC) - Plan: POC HgB A1c  Essential hypertension  Stage 3a chronic kidney disease (HCC) - Plan: Basic metabolic panel with GFR  No changes today. Monitor heart rate/pulse regularly.  If you need refills for medications you take chronically, please call your pharmacy. Do not use My Chart to request refills or for acute issues that need immediate attention. If you send a my chart message, it may take a few days to be addressed, specially if I am not in the office.  Please be sure medication list is accurate. If a new problem present, please set up appointment sooner than planned today.

## 2024-08-01 ENCOUNTER — Other Ambulatory Visit: Payer: Self-pay | Admitting: Cardiovascular Disease

## 2024-08-01 DIAGNOSIS — I10 Essential (primary) hypertension: Secondary | ICD-10-CM

## 2024-08-01 DIAGNOSIS — I2510 Atherosclerotic heart disease of native coronary artery without angina pectoris: Secondary | ICD-10-CM

## 2024-08-01 DIAGNOSIS — E782 Mixed hyperlipidemia: Secondary | ICD-10-CM

## 2024-08-02 ENCOUNTER — Ambulatory Visit: Payer: Self-pay | Admitting: Family Medicine

## 2024-08-03 NOTE — Assessment & Plan Note (Signed)
 BP adequately controlled. Continue lisinopril  10 mg daily, carvedilol  6.25 mg twice daily, and HCTZ 25 mg 1/2 tablet daily and well as low-salt/DASH diet. Eye exam is current.

## 2024-08-22 ENCOUNTER — Ambulatory Visit: Admitting: Family Medicine

## 2024-09-29 ENCOUNTER — Ambulatory Visit: Admitting: Physician Assistant

## 2024-11-06 ENCOUNTER — Ambulatory Visit

## 2024-12-06 ENCOUNTER — Ambulatory Visit: Admitting: Physician Assistant
